# Patient Record
Sex: Male | Born: 1935 | Race: White | Hispanic: No | Smoking: Never smoker
Health system: Southern US, Community
[De-identification: ages and names within clinical notes are randomized; demographics above are authoritative.]

## PROBLEM LIST (undated history)

## (undated) DIAGNOSIS — J189 Pneumonia, unspecified organism: Secondary | ICD-10-CM

## (undated) DIAGNOSIS — I639 Cerebral infarction, unspecified: Secondary | ICD-10-CM

## (undated) DIAGNOSIS — I10 Essential (primary) hypertension: Secondary | ICD-10-CM

## (undated) DIAGNOSIS — J209 Acute bronchitis, unspecified: Secondary | ICD-10-CM

## (undated) DIAGNOSIS — K219 Gastro-esophageal reflux disease without esophagitis: Secondary | ICD-10-CM

## (undated) HISTORY — DX: Essential (primary) hypertension: I10

## (undated) HISTORY — DX: Acute bronchitis, unspecified: J20.9

## (undated) HISTORY — PX: JOINT REPLACEMENT: SHX530

## (undated) HISTORY — PX: BACK SURGERY: SHX140

## (undated) HISTORY — PX: LUMBAR DISC SURGERY: SHX700

## (undated) HISTORY — DX: Cerebral infarction, unspecified: I63.9

## (undated) HISTORY — PX: HERNIA REPAIR: SHX51

---

## 1998-02-22 ENCOUNTER — Encounter: Admission: RE | Admit: 1998-02-22 | Discharge: 1998-04-29 | Payer: Self-pay | Admitting: Internal Medicine

## 1998-02-22 ENCOUNTER — Encounter: Payer: Self-pay | Admitting: Orthopedic Surgery

## 1998-06-08 ENCOUNTER — Encounter: Payer: Self-pay | Admitting: Pulmonary Disease

## 1998-06-08 ENCOUNTER — Inpatient Hospital Stay (HOSPITAL_COMMUNITY): Admission: EM | Admit: 1998-06-08 | Discharge: 1998-06-10 | Payer: Self-pay | Admitting: Pulmonary Disease

## 1999-05-09 ENCOUNTER — Ambulatory Visit (HOSPITAL_COMMUNITY): Admission: RE | Admit: 1999-05-09 | Discharge: 1999-05-09 | Payer: Self-pay | Admitting: Pulmonary Disease

## 1999-05-09 ENCOUNTER — Encounter: Payer: Self-pay | Admitting: Pulmonary Disease

## 1999-05-18 ENCOUNTER — Inpatient Hospital Stay (HOSPITAL_COMMUNITY): Admission: RE | Admit: 1999-05-18 | Discharge: 1999-05-20 | Payer: Self-pay | Admitting: Neurosurgery

## 1999-10-02 ENCOUNTER — Encounter: Payer: Self-pay | Admitting: Internal Medicine

## 1999-10-02 ENCOUNTER — Ambulatory Visit (HOSPITAL_COMMUNITY): Admission: RE | Admit: 1999-10-02 | Discharge: 1999-10-02 | Payer: Self-pay | Admitting: Internal Medicine

## 2002-01-06 ENCOUNTER — Ambulatory Visit (HOSPITAL_COMMUNITY): Admission: RE | Admit: 2002-01-06 | Discharge: 2002-01-06 | Payer: Self-pay | Admitting: Neurosurgery

## 2002-01-29 ENCOUNTER — Emergency Department (HOSPITAL_COMMUNITY): Admission: RE | Admit: 2002-01-29 | Discharge: 2002-01-29 | Payer: Self-pay | Admitting: Pulmonary Disease

## 2002-01-29 ENCOUNTER — Encounter: Payer: Self-pay | Admitting: Pulmonary Disease

## 2003-09-27 ENCOUNTER — Ambulatory Visit (HOSPITAL_COMMUNITY): Admission: RE | Admit: 2003-09-27 | Discharge: 2003-09-27 | Payer: Self-pay | Admitting: Neurosurgery

## 2004-02-24 ENCOUNTER — Ambulatory Visit: Payer: Self-pay | Admitting: Pulmonary Disease

## 2004-04-05 ENCOUNTER — Ambulatory Visit: Payer: Self-pay | Admitting: Pulmonary Disease

## 2005-01-31 ENCOUNTER — Ambulatory Visit: Payer: Self-pay | Admitting: Pulmonary Disease

## 2005-04-10 ENCOUNTER — Ambulatory Visit: Payer: Self-pay | Admitting: Pulmonary Disease

## 2010-08-25 NOTE — Op Note (Signed)
[place]  Patient:    Russell [name], Russell [name]                      MRN: [id number] Proc. Date: 05/18/99 Adm. Date:  [id number] Attending:  Tressie Stalker D                           Operative Report  PREOPERATIVE DIAGNOSIS:  L4-5 degenerative disk disease, spondylosis, and far lateral herniated nucleus pulposus.  POSTOPERATIVE DIAGNOSIS:  L4-5 degenerative disk disease, spondylosis, and far lateral herniated nucleus pulposus.  PROCEDURE:  Far lateral (i.e. extraforaminal) microdiskectomy using microdissection at L4-5 on the left.  SURGEON:  Cristi Loron, M.D.  ASSISTANT:  Hewitt Shorts, M.D.  ANESTHESIA:  General endotracheal.  ESTIMATED BLOOD LOSS:  Less than 100 cc.  SPECIMENS:  None.  DRAINS:  None.  COMPLICATIONS:  None.  BRIEF HISTORY:  The patient is an 77 year old white male who began having severe left leg pain approximately one month ago without precipitating event.  He has failed medical management, and was therefore worked up with a lumbar MRI that demonstrated an extraforaminal disk herniation at L4-5 on the left.  His signs nd symptoms on physical examination were consistent with a left L4 radiculopathy. He had failed medical management and therefore weighed the risks, benefits and alternatives of surgery and decided to proceed with a far lateral microdiskectomy.  DESCRIPTION OF PROCEDURE:  The patient was brought to the operating room by the  anesthesia team.  General endotracheal anesthesia was induced.  The patient was  turned to the prone position on the Wilson frame, and his lumbosacral region was then shaved and prepared with Betadine, and scrubbed with Betadine solution. Sterile drapes were applied.  I then injected the area to be incised with Marcaine with epinephrine solution.  I made a midline incision over the L4-5 interspace. I used electrocautery to dissect down to the thoracolumbar fascia.  I  divided the  fascia just to the left of the midline and performed a left-sided subperiosteal  dissection, stripping the paraspinous musculature from the left spinous process and lamina of L4 and L5.  I obtained an intraoperative radiograph at the midline at  L4-5.  I used electrocautery to _______ the fascia from the lateral aspect of the facet joint and I used the McCullough retractor for exposure.  I then brought the operative microscope in the field and under instant magnification and illumination, I completed the microdissection/decompression. I used the Midas Rex high speed drill to drill off the lateral edge of the left L4-5 facet joint.  I drilled until I encountered the intertransverse ligament.  I then divided the ligament with the Kerrison punch and 15 blade scalpel.  I then carefully dissected through the soft tissue and identified the exiting L4 nerve  root and the L4 pedicle.  Beneath it was a huge free fragment disk herniation which was severely compressing the nerve root from the ventral direction.  I incised nto the disk fragment, removed it in several large fragments.  There was a large hole in the annulus, and I therefore, incised the annulus at the disk space and widened the hole somewhat, and performed a partial diskectomy in the interspace with the pituitary forceps and Epstein curets.  I then palpated about the ventral surface of the L4 nerve root and noted it was well-decompressed as it exited around the L4  pedicle and out  the soft tissues.  I used electrocautery to achieve strenuous hemostasis.  I then copiously irrigated the wound with Bacitracin solution, removed the solution, and then deposited Depo-Medrol and fentanyl solution in the epidural space.  I removed the McCullough retractor, and then reapproximated the patients thoracolumbar fascia with interrupted #1 Vicryl, the subcutaneous tissue with interrupted #1 Vicryl, the skin with  Steri-Strips and Benzoin.  The wound was then coated with Bacitracin ointment and sterile dressing was applied.  The drapes were removed and the patient was subsequently returned to he supine position, where he was extubated by the anesthesia team, and transported to the postanesthesia care unit in stable condition.  All sponge, instrument, and needle counts were correct at the end of this case. DD:  05/18/99 TD:  05/19/99 Job: [id number]

## 2010-08-25 NOTE — Discharge Summary (Signed)
[place]  Patient:    Russell [name], Russell [name]                      MRN: [id number] Adm. Date:  [id number] Disc. Date: [id number] Attending:  Tressie Stalker D                           Discharge Summary  For full details of this admission, please refer to typed history and physical.   BRIEF HISTORY:  The patient is an 77 year old white male, who began having severe left leg pain approximately one month ago without precipitating event.  He has failed medical management and was therefore worked up as an outpatient with a lumbar MRI which demonstrated a extraforaminal disk herniation, L4-5 on the left. His signs and symptoms on physical examination were consistent with a left L4 radiculopathy.  The patient therefore weighed the risks, benefits and alternatives of surgery and decided to proceed with a far lateral microdiskectomy.  For past medical history, past surgical history, medication prior to admission,  drug allergies, family medical history, social history, admission physical examination, imaging studies, assessment and plan, please refer to the transcribed. history and physical.  HOSPITAL COURSE:  I admitted the patient to [facility] on May 18, 1999, with a diagnosis of L4-5 degenerative disease, far lateral herniated nucleus pulposus, spondylosis.  On the day of admission I performed a far lateral, i.e., extraforaminal and microdiskectomy using microdissection at L4-5 on the left. he surgery went well without complications (for full details of this operation, please refer to transcribed operative note).  POSTOPERATIVE COURSE:  The patients postoperative course is essentially unremarkable.  By postop day #2 he is eating and ambulating well.  His wound was healing well without signs of infection.  His leg pain was gone and he is requesting discharge to home.  I therefore discharged him to home on  May 20, 1999.  DISCHARGE INSTRUCTIONS:  The patient was given written discharge instructions and instructed to follow up with me in three weeks.  DISCHARGE MEDICATIONS: 1. Vicodin, #60, one to two p.o. q.4h. p.r.n. for pain, limit 8 per day, one refill. 2. Valium 5 mg, #30, one p.o. q.6h. p.r.n. for muscle spasms.  No refills.  FINAL DIAGNOSES:  Far lateral herniated nucleus pulposus, L4-5 on the left, L4-5 degenerative disease, spondylosis.  PROCEDURE PERFORMED:  Far lateral microdiskectomy using microdissection, L4-5 on the left. DD:  05/20/99 TD:  05/22/99 Job: [id number]

## 2010-08-25 NOTE — H&P (Signed)
[place]  Patient:    Russell [name], Russell [name]                      MRN: [id number] Adm. Date:  [id number] Attending:  Tressie Stalker D                         History and Physical  CHIEF COMPLAINT:  Left leg pain.  HISTORY OF PRESENT ILLNESS:  The patient is an 77 year old white male who began  having pain in his left leg several weeks ago.  It was severe.  He does not recall any precipitating event.  He just "woke up with it."  He complained to his primary doctor, Dr. Lonzo Cloud. Kriste Basque, who treated him with medications.  Unfortunately, did not improve and he was sent for lumbar MRI which demonstrated disk herniation, nd was kindly sent for my consultation.  The patient complains of severe pain in his left leg which limits his activities of daily living, and makes it difficult to even walk.  He had been using a cane or  wheelchair at times.  He describes pain which began in his left hip, radiates down the left leg just past the knee.  It is not associated with any numbness or tingling.  He says his leg has been weak in general and his knee gives out.  He has no trouble on the right.  He has not improved despite medical management.  He has little back pain.  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  PAST SURGICAL HISTORY:  Right inguinal herniorrhaphy.  MEDICATIONS PRIOR TO ADMISSION: 1. Norvasc 5 mg p.o. q.d. 2. Monopril 10 mg p.o. q.d. 3. Aspirin 1 p.o. q.d. 4. Vioxx 25 mg p.o. q.d. 5. Vicodin 1 p.o. q.4h. p.r.n. for pain. 6. Medrol Dosepak. 7. Levaquin daily.  ALLERGIES:  PENICILLIN causes syncope and rash.  FAMILY MEDICAL HISTORY:  The patients mother died at age 16 secondary to a cerebral aneurysm.  Father died in his 30s secondary to double  pneumonia.  SOCIAL HISTORY:  The patient is married, he has four children, he is retired from [employer] and lives in [place].  He denies tobacco and drug se. He drinks one alcohol drink every other day.  REVIEW OF SYSTEMS:  Negative except as above.  PHYSICAL EXAMINATION:  GENERAL:  A pleasant, healthy-appearing 77 year old white male in obvious distress, walking with an antalgic gait, pushing along a wheelchair.  HEIGHT AND WEIGHT:  Height 5 feet 10 inches, weight 186 pounds.  HEENT:  Normocephalic, atraumatic.  Pupils are equal, round and reactive to light. Extraocular muscles intact.  Sclerae white, conjunctivae pink.  Oropharynx benign, uvula midline.  NECK:  Supple.  There are no masses, meningismus, deformities, tracheal deviation, jugular venous distension, or carotid bruits.  There is normal cervical range of motion.  Spurling test is negative.  Lhermittes sign was not present.  THORAX:  Symmetric.  Lungs are clear to auscultation without rales, rhonchi, or  wheezes.  HEART:  Regular rate and rhythm.  ABDOMEN:  Soft, nontender, no guarding or rebound.  EXTREMITIES:  Has a well-healed right lower extremity shrapnel injury.  He is tender to palpation on movement of his left leg, but has no obvious deformities.  BACK:  Demonstrates no point  tenderness or deformity.  Straight leg raise testing is positive on the left, negative on the right.  Faberes test is negative bilaterally.  NEUROLOGIC:  The patient is alert and oriented x 3.  Cranial nerves II-XII are grossly intact bilaterally.  Vision and hearing are grossly normal bilaterally.  Motor strength is 5/5 in the bilateral deltoids, biceps, triceps, hand grips, wrist extensors, interosseus, psoas, gastrocnemius, extensor hallucis longus, and right quadriceps.  He has some diminished strength in his left quadriceps, i.e. 4/5.  Cerebellar exam is intact to rapid alternating movements of  the upper extremities bilaterally.  Sensory exam demonstrates decreased pinprick and light touch sensation in his anterolateral left lower extremity, consistent with the L4-L5 distribution.  Deep tendon reflexes are 2/4 in the bilateral biceps, triceps, brachioradialis, right quadriceps, and gastrocnemius; absent in his left quadriceps; 1/4 left gastrocnemius.  DIAGNOSTIC STUDIES:  I have reviewed the patients lumbar plain x-ray performed May 09, 1999.  He has multi-level degenerative disease and spondylosis.  He had a lumbar MRI performed at [facility] on May 09, 1999, which demonstrates he has normal lumbar lordosis, no ______ or subluxation.  He has degenerative disk disease at L4-5 with a bulging disk.  _____ demonstrate he has a large herniated disk far lateral on the left.  ASSESSMENT AND PLAN:  L4-5 far lateral herniated disk with L4 radiculopathy, degenerative disease, spondylosis.  I have discussed the procedure with the patient, reviewed his MRI scan with him, pointed out the abnormalities.  While he MRI scan is of low resolution, the abnormalities seen do seem consistent with his signs, symptoms, and physical exam, i.e., a left L4 radiculopathy.  I have discussed the various treatment options with him, including continued medical management, steroid injections, and surgery.  I described the procedure of a far lateral microdiskectomy ______ .  I have discussed the risks of surgery extensively.  The patient has weighed the risks, benefits, and alternatives of surgery and would like to proceed with the operation on May 18, 1999.  History of hypertension, prior cerebrovascular accident, etc. noted.DD: 05/18/99 TD:  05/18/99 Job: [id number]

## 2011-03-15 ENCOUNTER — Encounter: Payer: Self-pay | Admitting: Internal Medicine

## 2011-03-20 ENCOUNTER — Ambulatory Visit: Payer: Self-pay | Admitting: Internal Medicine

## 2011-03-20 ENCOUNTER — Encounter: Payer: Self-pay | Admitting: Internal Medicine

## 2012-02-05 ENCOUNTER — Inpatient Hospital Stay (HOSPITAL_COMMUNITY)
Admission: EM | Admit: 2012-02-05 | Discharge: 2012-02-11 | DRG: 640 | Disposition: A | Discharge status: Nursing Home (Includes ICF) | Payer: Medicare Other | Attending: Internal Medicine | Admitting: Internal Medicine

## 2012-02-05 ENCOUNTER — Other Ambulatory Visit: Payer: Self-pay

## 2012-02-05 DIAGNOSIS — I129 Hypertensive chronic kidney disease with stage 1 through stage 4 chronic kidney disease, or unspecified chronic kidney disease: Secondary | ICD-10-CM | POA: Diagnosis present

## 2012-02-05 DIAGNOSIS — R918 Other nonspecific abnormal finding of lung field: Secondary | ICD-10-CM | POA: Diagnosis present

## 2012-02-05 DIAGNOSIS — J209 Acute bronchitis, unspecified: Secondary | ICD-10-CM | POA: Diagnosis present

## 2012-02-05 DIAGNOSIS — Z79899 Other long term (current) drug therapy: Secondary | ICD-10-CM

## 2012-02-05 DIAGNOSIS — N189 Chronic kidney disease, unspecified: Secondary | ICD-10-CM | POA: Diagnosis present

## 2012-02-05 DIAGNOSIS — R531 Weakness: Secondary | ICD-10-CM

## 2012-02-05 DIAGNOSIS — R222 Localized swelling, mass and lump, trunk: Secondary | ICD-10-CM | POA: Diagnosis present

## 2012-02-05 DIAGNOSIS — R1012 Left upper quadrant pain: Secondary | ICD-10-CM | POA: Diagnosis present

## 2012-02-05 DIAGNOSIS — R110 Nausea: Secondary | ICD-10-CM | POA: Diagnosis present

## 2012-02-05 DIAGNOSIS — D649 Anemia, unspecified: Secondary | ICD-10-CM | POA: Diagnosis present

## 2012-02-05 DIAGNOSIS — R410 Disorientation, unspecified: Secondary | ICD-10-CM

## 2012-02-05 DIAGNOSIS — Z880 Allergy status to penicillin: Secondary | ICD-10-CM

## 2012-02-05 DIAGNOSIS — N19 Unspecified kidney failure: Secondary | ICD-10-CM

## 2012-02-05 DIAGNOSIS — E871 Hypo-osmolality and hyponatremia: Principal | ICD-10-CM | POA: Diagnosis present

## 2012-02-05 DIAGNOSIS — J189 Pneumonia, unspecified organism: Secondary | ICD-10-CM | POA: Diagnosis present

## 2012-02-05 DIAGNOSIS — K8020 Calculus of gallbladder without cholecystitis without obstruction: Secondary | ICD-10-CM | POA: Diagnosis present

## 2012-02-05 DIAGNOSIS — Z8673 Personal history of transient ischemic attack (TIA), and cerebral infarction without residual deficits: Secondary | ICD-10-CM

## 2012-02-05 LAB — CBC
HCT: 25.7 % — ABNORMAL LOW (ref 39.0–52.0)
Hemoglobin: 9 g/dL — ABNORMAL LOW (ref 13.0–17.0)
MCH: 30.3 pg (ref 26.0–34.0)
MCHC: 35 g/dL (ref 30.0–36.0)
MCV: 86.5 fL (ref 78.0–100.0)
Platelets: 317 10*3/uL (ref 150–400)
RBC: 2.97 MIL/uL — ABNORMAL LOW (ref 4.22–5.81)
RDW: 14.2 % (ref 11.5–15.5)
WBC: 8.4 10*3/uL (ref 4.0–10.5)

## 2012-02-05 LAB — BASIC METABOLIC PANEL
BUN: 27 mg/dL — ABNORMAL HIGH (ref 6–23)
CO2: 24 mEq/L (ref 19–32)
Calcium: 8.5 mg/dL (ref 8.4–10.5)
Chloride: 90 mEq/L — ABNORMAL LOW (ref 96–112)
Creatinine, Ser: 2.23 mg/dL — ABNORMAL HIGH (ref 0.50–1.35)
GFR calc Af Amer: 28 mL/min — ABNORMAL LOW (ref >90)
GFR calc non Af Amer: 24 mL/min — ABNORMAL LOW (ref >90)
Glucose, Bld: 106 mg/dL — ABNORMAL HIGH (ref 70–99)
Potassium: 3.8 mEq/L (ref 3.5–5.1)
Sodium: 124 mEq/L — ABNORMAL LOW (ref 135–145)

## 2012-02-05 LAB — URINALYSIS, ROUTINE W REFLEX MICROSCOPIC
Bilirubin Urine: NEGATIVE
Glucose, UA: NEGATIVE mg/dL
Hgb urine dipstick: NEGATIVE
Ketones, ur: NEGATIVE mg/dL
Leukocytes, UA: NEGATIVE
Nitrite: NEGATIVE
Protein, ur: NEGATIVE mg/dL
Specific Gravity, Urine: 1.01 (ref 1.005–1.030)
Urobilinogen, UA: 0.2 mg/dL (ref 0.0–1.0)
pH: 6 (ref 5.0–8.0)

## 2012-02-05 MED ORDER — IOHEXOL 300 MG/ML  SOLN
20.0000 mL | INTRAMUSCULAR | Status: AC
  Administered 2012-02-05 (×2): 20 mL via ORAL

## 2012-02-05 MED ORDER — SODIUM CHLORIDE 0.9 % IV BOLUS (SEPSIS)
500.0000 mL | Freq: Once | INTRAVENOUS | Status: AC
  Administered 2012-02-05: 500 mL via INTRAVENOUS

## 2012-02-05 NOTE — ED Notes (Signed)
Patient is resting comfortably. 

## 2012-02-05 NOTE — ED Notes (Addendum)
EMS called out to [facility] by MD.  Patient is known to have electrolyte abnormalities, fever and increased confusion and tenderness in LLQ also diarrhea and nausea.  He also complained headache and dizziness.  According to EMS his main complaint is weakness and urinary frequency.  EMS also advised he is saying things that do not make sense.  In and out of confusion.  Patient's vitals are BP=102/56, HR=80, CBG=121, Resp 16.  Report received from [facility], [place].

## 2012-02-05 NOTE — ED Notes (Signed)
Patient was given decaf coffee. 

## 2012-02-05 NOTE — ED Provider Notes (Signed)
History    78 year old male with reported fever and some confusion. Patient has seemed increasingly fatigued over the past 2 days. Patient is complaining of increased urinary frequency, left lower quadrant pain and generalized weakness. He denies feeling febrile or having chills. Mild nausea but no vomiting. His abdominal pain is in the left lower quadrant. There is relatively constant. Mild ache and does not radiate. No appreciable exacerbating relieving factors. Having diarrhea. No progressive blood per rectum or melena. No recent antibiotic use. No recent falls reported.  CSN: [id number]  Arrival date & time 02/05/12  1629   First MD Initiated Contact with Patient 02/05/12 1638      Chief Complaint  Patient presents with  . Fever  . Urinary Tract Infection    (Consider location/radiation/quality/duration/timing/severity/associated sxs/prior treatment) HPI  Past Medical History  Diagnosis Date  . Stroke   . Acute bronchitis   . Hypertension     No past surgical history on file.  No family history on file.  History  Substance Use Topics  . Smoking status: Not on file  . Smokeless tobacco: Not on file  . Alcohol Use: Not on file      Review of Systems   Review of symptoms negative unless otherwise noted in HPI.   Allergies  Penicillins  Home Medications   Current Outpatient Rx  Name Route Sig Dispense Refill  . ACETAMINOPHEN 100 MG/ML PO SOLN Oral Take 10 mg/kg by mouth as needed.      Marland Kitchen DIAZEPAM 5 MG PO TABS Oral Take 5 mg by mouth every 6 (six) hours as needed.      Marland Kitchen HYDROCODONE-ACETAMINOPHEN 5-500 MG PO TABS Oral Take 1 tablet by mouth as needed.      Marland Kitchen HYDROXYZINE HCL 10 MG PO TABS Oral Take 10 mg by mouth as needed.      Marland Kitchen MAGNESIUM HYDROXIDE 400 MG/5ML PO SUSP Oral Take by mouth as needed.      Marland Kitchen NAPROXEN PO Oral Take 50 mg by mouth every 8 (eight) hours.      . OMEPRAZOLE 20 MG PO CPDR Oral Take 20 mg by mouth daily.      Marland Kitchen PROPOXYPHENE  N-ACETAMINOPHEN 100-650 MG PO TABS Oral Take 1 tablet by mouth as needed.        BP 111/60  Pulse 58  Temp 98.9 F (37.2 C) (Rectal)  Resp 16  SpO2 97%  Physical Exam  Nursing note and vitals reviewed. Constitutional: He appears well-developed and well-nourished. No distress.  HENT:  Head: Normocephalic and atraumatic.  Eyes: Conjunctivae normal are normal. Pupils are equal, round, and reactive to light. Right eye exhibits no discharge. Left eye exhibits no discharge.  Neck: Neck supple.  Cardiovascular: Normal rate, regular rhythm and normal heart sounds.  Exam reveals no gallop and no friction rub.   No murmur heard. Pulmonary/Chest: Effort normal and breath sounds normal. No respiratory distress.  Abdominal: Soft. He exhibits no distension. There is tenderness.       Left lower quadrant tenderness without guarding or rebound. No distention. No palpable mass.  Genitourinary:       No costovertebral angle tenderness  Musculoskeletal: He exhibits no edema and no tenderness.  Neurological: He is alert. No cranial nerve deficit. He exhibits normal muscle tone. Coordination normal.       Patient is disoriented to time  Skin: Skin is warm and dry.  Psychiatric: He has a normal mood and affect. His behavior is normal. Thought content normal.  ED Course  Procedures (including critical care time)  Labs Reviewed  CBC - Abnormal; Notable for the following:    RBC 2.97 (*)     Hemoglobin 9.0 (*)     HCT 25.7 (*)     All other components within normal limits  BASIC METABOLIC PANEL - Abnormal; Notable for the following:    Sodium 124 (*)     Chloride 90 (*)     Glucose, Bld 106 (*)     BUN 27 (*)     Creatinine, Ser 2.23 (*)     GFR calc non Af Amer 24 (*)     GFR calc Af Amer 28 (*)     All other components within normal limits  URINALYSIS, ROUTINE W REFLEX MICROSCOPIC   No results found.   1. Hyponatremia   2. Renal failure, unspecified   3. Acute confusion        MDM  89yM with reported confusion. Noted to be hyponatremic and elevated Cr. Baseline not clear. No priors for comparison. Per patient, no hx of kidney issues that he is aware although his history is not completely reliable. None noted on PMHx. Seems like most of prior care has been through [place]. C/o of urinary frequency. UA normal. Mild LLQ tenderness on exam so will CT, particularly given age. No fever, leukocytosis, diarrhea, etc to suggest diverticulitis. Given hyponatremia of unclear chronicity and reported confusion will admit.        Raeford Razor, MD 02/06/12 [phone]

## 2012-02-06 ENCOUNTER — Observation Stay (HOSPITAL_COMMUNITY): Payer: Medicare Other

## 2012-02-06 ENCOUNTER — Encounter (HOSPITAL_COMMUNITY): Payer: Self-pay | Admitting: Radiology

## 2012-02-06 DIAGNOSIS — R5383 Other fatigue: Secondary | ICD-10-CM

## 2012-02-06 DIAGNOSIS — E871 Hypo-osmolality and hyponatremia: Secondary | ICD-10-CM | POA: Diagnosis present

## 2012-02-06 DIAGNOSIS — N189 Chronic kidney disease, unspecified: Secondary | ICD-10-CM | POA: Diagnosis present

## 2012-02-06 DIAGNOSIS — R531 Weakness: Secondary | ICD-10-CM | POA: Diagnosis present

## 2012-02-06 DIAGNOSIS — D649 Anemia, unspecified: Secondary | ICD-10-CM | POA: Diagnosis present

## 2012-02-06 DIAGNOSIS — N19 Unspecified kidney failure: Secondary | ICD-10-CM

## 2012-02-06 LAB — CBC WITH DIFFERENTIAL/PLATELET
Lymphocytes Relative: 18 % (ref 12–46)
Lymphs Abs: 1.5 10*3/uL (ref 0.7–4.0)
Neutro Abs: 5.6 10*3/uL (ref 1.7–7.7)
Neutrophils Relative %: 66 % (ref 43–77)
Platelets: 299 10*3/uL (ref 150–400)
RBC: 2.78 MIL/uL — ABNORMAL LOW (ref 4.22–5.81)
WBC: 8.4 10*3/uL (ref 4.0–10.5)

## 2012-02-06 LAB — SODIUM, URINE, RANDOM: Sodium, Ur: 51 mEq/L

## 2012-02-06 LAB — BASIC METABOLIC PANEL
BUN: 23 mg/dL (ref 6–23)
CO2: 20 mEq/L (ref 19–32)
Calcium: 8 mg/dL — ABNORMAL LOW (ref 8.4–10.5)
Chloride: 97 mEq/L (ref 96–112)
Creatinine, Ser: 1.8 mg/dL — ABNORMAL HIGH (ref 0.50–1.35)
GFR calc Af Amer: 36 mL/min — ABNORMAL LOW (ref >90)
GFR calc non Af Amer: 28 mL/min — ABNORMAL LOW (ref >90)
GFR calc non Af Amer: 30 mL/min — ABNORMAL LOW (ref >90)
Glucose, Bld: 87 mg/dL (ref 70–99)
Glucose, Bld: 81 mg/dL (ref 70–99)
Potassium: 4.2 mEq/L (ref 3.5–5.1)
Potassium: 4.5 mEq/L (ref 3.5–5.1)
Sodium: 123 mEq/L — ABNORMAL LOW (ref 135–145)
Sodium: 129 mEq/L — ABNORMAL LOW (ref 135–145)

## 2012-02-06 LAB — HEPATIC FUNCTION PANEL
Albumin: 2.6 g/dL — ABNORMAL LOW (ref 3.5–5.2)
Bilirubin, Direct: <0.1 mg/dL (ref 0.0–0.3)
Total Bilirubin: 0.2 mg/dL — ABNORMAL LOW (ref 0.3–1.2)

## 2012-02-06 LAB — OSMOLALITY, URINE: Osmolality, Ur: 268 mOsm/kg — ABNORMAL LOW (ref 390–1090)

## 2012-02-06 MED ORDER — ONDANSETRON HCL 4 MG PO TABS: 4.0000 mg | ORAL_TABLET | Freq: Four times a day (QID) | ORAL | Status: DC | PRN

## 2012-02-06 MED ORDER — ACETAMINOPHEN 325 MG PO TABS
650.0000 mg | ORAL_TABLET | ORAL | Status: DC | PRN
  Administered 2012-02-06: 650 mg via ORAL
  Filled 2012-02-06: qty 2

## 2012-02-06 MED ORDER — ACETAMINOPHEN 650 MG RE SUPP: 650.0000 mg | Freq: Four times a day (QID) | RECTAL | Status: DC | PRN

## 2012-02-06 MED ORDER — ONDANSETRON HCL 4 MG/2ML IJ SOLN: 4.0000 mg | Freq: Three times a day (TID) | INTRAMUSCULAR | Status: DC | PRN

## 2012-02-06 MED ORDER — SODIUM CHLORIDE 0.9 % IV SOLN
INTRAVENOUS | Status: AC
  Administered 2012-02-06: 16:00:00 via INTRAVENOUS

## 2012-02-06 MED ORDER — HYDROCODONE-ACETAMINOPHEN 5-325 MG PO TABS
1.0000 | ORAL_TABLET | Freq: Two times a day (BID) | ORAL | Status: DC | PRN
  Administered 2012-02-09 – 2012-02-11 (×3): 1 via ORAL
  Filled 2012-02-06 (×3): qty 1

## 2012-02-06 MED ORDER — PANTOPRAZOLE SODIUM 40 MG PO TBEC
40.0000 mg | DELAYED_RELEASE_TABLET | Freq: Every day | ORAL | Status: DC
  Administered 2012-02-06 – 2012-02-11 (×6): 40 mg via ORAL
  Filled 2012-02-06 (×6): qty 1

## 2012-02-06 MED ORDER — ACETAMINOPHEN 325 MG PO TABS
650.0000 mg | ORAL_TABLET | Freq: Four times a day (QID) | ORAL | Status: DC | PRN
  Administered 2012-02-07 – 2012-02-11 (×7): 650 mg via ORAL
  Filled 2012-02-06 (×7): qty 2

## 2012-02-06 MED ORDER — DIAZEPAM 5 MG PO TABS
5.0000 mg | ORAL_TABLET | Freq: Four times a day (QID) | ORAL | Status: DC | PRN
Start: 2012-02-06 — End: 2012-02-07

## 2012-02-06 MED ORDER — ONDANSETRON HCL 4 MG/2ML IJ SOLN: 4.0000 mg | Freq: Four times a day (QID) | INTRAMUSCULAR | Status: DC | PRN

## 2012-02-06 MED ORDER — HYDROCODONE-ACETAMINOPHEN 5-500 MG PO TABS: 1.0000 | ORAL_TABLET | Freq: Two times a day (BID) | ORAL | Status: DC | PRN

## 2012-02-06 MED ORDER — SODIUM CHLORIDE 0.9 % IV SOLN
INTRAVENOUS | Status: DC
  Administered 2012-02-06: 02:00:00 via INTRAVENOUS

## 2012-02-06 NOTE — Progress Notes (Signed)
Utilization review completed.  

## 2012-02-06 NOTE — H&P (Signed)
Russell [name] is an 78 y.o. male.   Patient was seen and examined on February 06, 2012. PCP - physician at the assisted-living facility. Chief Complaint: Weakness and confusion. HPI: 78 year-old male with previous history of hypertension and CVA was brought to the ER from his assisted living facility because of weakness and confusion. Patient also had complained of some headache and nausea. Patient has been feeling weak for last 2 days. In the ER patient was initially found to be mildly confused. He also was complaining of some left upper quadrant pain and also stated that he has some nausea. His labs show hyponatremia with increased creatinine and we don't have a baseline creatinine of the patient. Patient also had a CT abdomen pelvis which only shows gallstones otherwise unremarkable. Patient at this time has been admitted for further observation.  Past Medical History  Diagnosis Date  . Stroke   . Acute bronchitis   . Hypertension     Past Surgical History  Procedure Date  . Hernia repair   . Lumbar disc surgery     Family History  Problem Relation Age of Onset  . CVA Mother    Social History:  reports that he has never smoked. He does not have any smokeless tobacco history on file. He reports that he does not drink alcohol or use illicit drugs.  Allergies:  Allergies  Allergen Reactions  . Penicillins     Medications Prior to Admission  Medication Sig Dispense Refill  . acetaminophen (TYLENOL) 100 MG/ML solution Take 10 mg/kg by mouth as needed.        . diazepam (VALIUM) 5 MG tablet Take 5 mg by mouth every 6 (six) hours as needed.        Marland Kitchen HYDROcodone-acetaminophen (VICODIN) 5-500 MG per tablet Take 1 tablet by mouth as needed.        . hydrOXYzine (ATARAX/VISTARIL) 10 MG tablet Take 10 mg by mouth as needed.        . magnesium hydroxide (MILK OF MAGNESIA) 400 MG/5ML suspension Take by mouth as needed.        Marland Kitchen NAPROXEN PO Take 50 mg by mouth every 8 (eight) hours.          Marland Kitchen omeprazole (PRILOSEC) 20 MG capsule Take 20 mg by mouth daily.        . propoxyphene-acetaminophen (DARVOCET-N 100) 100-650 MG per tablet Take 1 tablet by mouth as needed.          Results for orders placed during the hospital encounter of 02/05/12 (from the past 48 hour(s))  CBC     Status: Abnormal   Collection Time   02/05/12  4:57 PM      Component Value Range Comment   WBC 8.4  4.0 - 10.5 K/uL    RBC 2.97 (*) 4.22 - 5.81 MIL/uL    Hemoglobin 9.0 (*) 13.0 - 17.0 g/dL    HCT 45.4 (*) 09.8 - 52.0 %    MCV 86.5  78.0 - 100.0 fL    MCH 30.3  26.0 - 34.0 pg    MCHC 35.0  30.0 - 36.0 g/dL    RDW 11.9  14.7 - 82.9 %    Platelets 317  150 - 400 K/uL   BASIC METABOLIC PANEL     Status: Abnormal   Collection Time   02/05/12  4:57 PM      Component Value Range Comment   Sodium 124 (*) 135 - 145 mEq/L  Potassium 3.8  3.5 - 5.1 mEq/L    Chloride 90 (*) 96 - 112 mEq/L    CO2 24  19 - 32 mEq/L    Glucose, Bld 106 (*) 70 - 99 mg/dL    BUN 27 (*) 6 - 23 mg/dL    Creatinine, Ser 5.28 (*) 0.50 - 1.35 mg/dL    Calcium 8.5  8.4 - 41.3 mg/dL    GFR calc non Af Amer 24 (*) >90 mL/min    GFR calc Af Amer 28 (*) >90 mL/min   URINALYSIS, ROUTINE W REFLEX MICROSCOPIC     Status: Normal   Collection Time   02/05/12  9:36 PM      Component Value Range Comment   Color, Urine YELLOW  YELLOW    APPearance CLEAR  CLEAR    Specific Gravity, Urine 1.010  1.005 - 1.030    pH 6.0  5.0 - 8.0    Glucose, UA NEGATIVE  NEGATIVE mg/dL    Hgb urine dipstick NEGATIVE  NEGATIVE    Bilirubin Urine NEGATIVE  NEGATIVE    Ketones, ur NEGATIVE  NEGATIVE mg/dL    Protein, ur NEGATIVE  NEGATIVE mg/dL    Urobilinogen, UA 0.2  0.0 - 1.0 mg/dL    Nitrite NEGATIVE  NEGATIVE    Leukocytes, UA NEGATIVE  NEGATIVE MICROSCOPIC NOT DONE ON URINES WITH NEGATIVE PROTEIN, BLOOD, LEUKOCYTES, NITRITE, OR GLUCOSE <1000 mg/dL.   Ct Abdomen Pelvis Wo Contrast  02/06/2012  *RADIOLOGY REPORT*  Clinical Data: Urinary tract  infection.  Fever increased confusion. Left lower quadrant pain.  Diarrhea and nausea.  CT ABDOMEN AND PELVIS WITHOUT CONTRAST  Technique:  Multidetector CT imaging of the abdomen and pelvis was performed following the standard protocol without intravenous contrast.  Comparison: None.  Findings: Mild dependent atelectasis is present in the right lung base.  There is some scarring laterally in the left lower lobe.  The heart size is normal.  Coronary artery calcifications are evident.  A 10 mm hypodense lesion in the left lobe liver is likely benign. The liver is otherwise unremarkable.  The spleen is within normal limits.  The stomach, duodenum, and pancreas are within normal limits.  The common bile duct is normal.  Layering stones are present in the gallbladder without evidence for inflammation.  The adrenal glands are normal bilaterally.  There is mild stranding about both kidneys without focal inflammation or hydronephrosis. Ureters are within normal limits to the urinary bladder bilaterally.  The urinary bladder is unremarkable.  The rectosigmoid colon is within normal limits.  The remainder of the colon is unremarkable.  The appendix is visualized and within normal limits.  The small bowel is unremarkable.  No significant adenopathy or free fluid is present.  There is some fat herniating into the inguinal canals bilaterally without associated bowel.  Bone windows demonstrate moderate endplate degenerative changes at L3-4, L4-5, and L5-S1.  Slight leftward curvature of the lumbar spine is present.  IMPRESSION:  1.  No acute or focal abnormality to explain the patient's symptoms. 2.  Atherosclerosis. 3.  Cholelithiasis without evidence for cholecystitis. 4.  Atelectasis and scarring at the lung bases. 5.  Spondylosis of the lower lumbar spine. 6.  10 mm hypodense lesion in the left lobe of liver likely represents a simple cyst.   Original Report Authenticated By: Jamesetta Orleans. MATTERN, M.D.    Dg Chest Port 1  View  02/06/2012  *RADIOLOGY REPORT*  Clinical Data: Infiltrates.  Shortness of breath.  PORTABLE  CHEST - 1 VIEW  Comparison: None.  Findings: The heart size is exaggerated by low lung volumes. Moderate pulmonary vascular congestion is evident.  There is some tortuosity of the aorta.  Mild dependent atelectasis is present bilaterally.  IMPRESSION:  1.  Low lung volumes and moderate to pulmonary vascular congestion. 2.  Mild bibasilar atelectasis.   Original Report Authenticated By: Jamesetta Orleans. MATTERN, M.D.     Review of Systems  Respiratory: Negative.   Cardiovascular: Negative.   Gastrointestinal: Positive for nausea.  Genitourinary: Negative.   Musculoskeletal: Negative.   Skin: Negative.   Neurological: Positive for weakness and headaches.  Endo/Heme/Allergies: Negative.   Psychiatric/Behavioral: Negative.     Blood pressure 118/67, pulse 56, temperature 98.2 F (36.8 C), temperature source Oral, resp. rate 19, weight 71.033 kg (156 lb 9.6 oz), SpO2 90.00%. Physical Exam  Constitutional: He is oriented to person, place, and time. He appears well-developed and well-nourished. No distress.  HENT:  Head: Normocephalic and atraumatic.  Right Ear: External ear normal.  Left Ear: External ear normal.  Nose: Nose normal.  Mouth/Throat: Oropharynx is clear and moist. No oropharyngeal exudate.  Eyes: Conjunctivae normal are normal. Pupils are equal, round, and reactive to light. Right eye exhibits no discharge. Left eye exhibits no discharge. No scleral icterus.  Neck: Normal range of motion. Neck supple.  Cardiovascular: Normal rate and regular rhythm.   Respiratory: Effort normal and breath sounds normal. No respiratory distress. He has no wheezes. He has no rales.  GI: Soft. Bowel sounds are normal. He exhibits no distension. There is no tenderness. There is no rebound and no guarding.  Musculoskeletal: He exhibits no edema and no tenderness.  Neurological: He is alert and oriented  to person, place, and time.       Moves all extremities.  Skin: Skin is warm and dry. He is not diaphoretic.     Assessment/Plan #1. Generalized weakness with fatigue and hyponatremia - given patient's low chloride level patient's hyponatremia probably would be from dehydration. At this time we will gently hydrate with saline and checked urine sodium and closely follow metabolic panel to see sodium trends. Check TSH. Further recommendations on IV fluids based on urine sodium and sodium trends. #2. Renal failure - not sure if his acute or chronic but given the anemia patient probably could be having chronic renal failure. UA is unremarkable. Closely follow metabolic panel. #3. Nausea with abdominal discomfort - presently patient has no pain. Patient's CAT scan which shows gallstones. Patient did complain of nausea but has not normal pain or tenderness. Check sonogram of the abdomen for gallbladder pathology. #4. Anemia - follow CBC and further workup as outpatient. #5. History of hypertension presently on no medications. #6. History of CVA as per records.  CODE STATUS - full code.  Gisele Pack N. 02/06/2012, 3:49 AM

## 2012-02-06 NOTE — ED Notes (Signed)
Patient transported to CT 

## 2012-02-06 NOTE — Progress Notes (Signed)
Nursing Admission Note  Pt arrived to 6702 via stretcher from the ED. Pt lives at [facility]. A&Ox3, with some confusion noted at times. Pt's only complaint is a slight headache, otherwise no distress noted. VSS. Skin is intact. Oriented to unit and surroundings. Call bell within reach. Bed alarm in place. C.Anvith Mauriello, RN.

## 2012-02-06 NOTE — Progress Notes (Signed)
Patient seen and examined by me.  Spoke with son who said patient had a temperature of 101 at the ALF.  Will get [facility] x2, no fever here. Patient says he is feeling better.  BMP q 12- hold HCTZ.   Marlin Canary DO

## 2012-02-06 NOTE — ED Notes (Signed)
Patient back from CT.

## 2012-02-06 NOTE — Progress Notes (Signed)
[facility] PROGRESS NOTE  Russell [name]:[id number] DOB: 1916/09/05 DOA: 02/05/2012 PCP: No primary provider on file.  Assessment/Plan: #1. Generalized weakness with fatigue and hyponatremia - Very little improvement. Given patient's low chloride level patient's hyponatremia probably would be from dehydration. Repeat bmet this am with little change. Will continue with gently IV hydration with saline.Urine sodium results pending. Will get q12 hour bmet to follow trend. TSH 0.92.  #2. Renal failure - not sure if his acute or chronic but given the anemia patient probably could be having chronic renal failure. UA is unremarkable. Closely follow metabolic panel.  #3. Nausea with abdominal discomfort - presently patient has no pain. Patient's CAT scan which shows gallstones. Continues with nausea no vomiting. Results of [place] pending.   #4. Anemia - follow CBC and further workup as outpatient.  #5. History of hypertension presently on no medications. SBP range 105-122. #6. History of CVA as per records.     Code Status: full Family Communication:  Disposition Plan: to be determined   Consultants:  none  Procedures:  none  Antibiotics:  none  HPI/Subjective: Lying in bed eyes closed. Responds to verbal stimuli. Reports "hurting all over" . Reports continued nausea.   Objective: Filed Vitals:   02/06/12 0139 02/06/12 0226 02/06/12 0550 02/06/12 0741  BP: 122/46 118/67 105/46 110/50  Pulse: 63 56 57 55  Temp:  98.2 F (36.8 C) 97.5 F (36.4 C) 97.9 F (36.6 C)  TempSrc:  Oral Oral Oral  Resp:  19 18 18   Weight:  71.033 kg (156 lb 9.6 oz)    SpO2: 94% 90% 97% 95%    Intake/Output Summary (Last 24 hours) at 02/06/12 1210 Last data filed at 02/06/12 1100  Gross per 24 hour  Intake    300 ml  Output    450 ml  Net   -150 ml   Filed Weights   02/06/12 0226  Weight: 71.033 kg (156 lb 9.6 oz)    Exam:   General:  Awake, slightly pale, NAD  Cardiovascular:  RRR No MGR No LEE PPP  Respiratory: normal effort. BSCTAB No wheeze/rhochi  Abdomen: soft +BS. Non tender to palpation.  Data Reviewed: Basic Metabolic Panel:  Lab 02/06/12 8119 02/05/12 1657  NA 123* 124*  K 4.0 3.8  CL 92* 90*  CO2 22 24  GLUCOSE 87 106*  BUN 23 27*  CREATININE 1.93* 2.23*  CALCIUM 8.0* 8.5  MG -- --  PHOS -- --   Liver Function Tests:  Lab 02/06/12 0611  AST 10  ALT 8  ALKPHOS 36*  BILITOT 0.2*  PROT 6.2  ALBUMIN 2.6*   No results found for this basename: LIPASE:5,AMYLASE:5 in the last 168 hours No results found for this basename: AMMONIA:5 in the last 168 hours CBC:  Lab 02/06/12 0611 02/05/12 1657  WBC 8.4 8.4  NEUTROABS 5.6 --  HGB 8.6* 9.0*  HCT 24.2* 25.7*  MCV 87.1 86.5  PLT 299 317   Cardiac Enzymes: No results found for this basename: CKTOTAL:5,CKMB:5,CKMBINDEX:5,TROPONINI:5 in the last 168 hours BNP (last 3 results) No results found for this basename: PROBNP:3 in the last 8760 hours CBG: No results found for this basename: GLUCAP:5 in the last 168 hours  No results found for this or any previous visit (from the past 240 hour(s)).   Studies: Ct Abdomen Pelvis Wo Contrast  02/06/2012  *RADIOLOGY REPORT*  Clinical Data: Urinary tract infection.  Fever increased confusion. Left lower quadrant pain.  Diarrhea and nausea.  CT ABDOMEN AND PELVIS WITHOUT CONTRAST  Technique:  Multidetector CT imaging of the abdomen and pelvis was performed following the standard protocol without intravenous contrast.  Comparison: None.  Findings: Mild dependent atelectasis is present in the right lung base.  There is some scarring laterally in the left lower lobe.  The heart size is normal.  Coronary artery calcifications are evident.  A 10 mm hypodense lesion in the left lobe liver is likely benign. The liver is otherwise unremarkable.  The spleen is within normal limits.  The stomach, duodenum, and pancreas are within normal limits.  The common bile duct  is normal.  Layering stones are present in the gallbladder without evidence for inflammation.  The adrenal glands are normal bilaterally.  There is mild stranding about both kidneys without focal inflammation or hydronephrosis. Ureters are within normal limits to the urinary bladder bilaterally.  The urinary bladder is unremarkable.  The rectosigmoid colon is within normal limits.  The remainder of the colon is unremarkable.  The appendix is visualized and within normal limits.  The small bowel is unremarkable.  No significant adenopathy or free fluid is present.  There is some fat herniating into the inguinal canals bilaterally without associated bowel.  Bone windows demonstrate moderate endplate degenerative changes at L3-4, L4-5, and L5-S1.  Slight leftward curvature of the lumbar spine is present.  IMPRESSION:  1.  No acute or focal abnormality to explain the patient's symptoms. 2.  Atherosclerosis. 3.  Cholelithiasis without evidence for cholecystitis. 4.  Atelectasis and scarring at the lung bases. 5.  Spondylosis of the lower lumbar spine. 6.  10 mm hypodense lesion in the left lobe of liver likely represents a simple cyst.   Original Report Authenticated By: Jamesetta Orleans. MATTERN, M.D.    Dg Chest Port 1 View  02/06/2012  *RADIOLOGY REPORT*  Clinical Data: Infiltrates.  Shortness of breath.  PORTABLE CHEST - 1 VIEW  Comparison: None.  Findings: The heart size is exaggerated by low lung volumes. Moderate pulmonary vascular congestion is evident.  There is some tortuosity of the aorta.  Mild dependent atelectasis is present bilaterally.  IMPRESSION:  1.  Low lung volumes and moderate to pulmonary vascular congestion. 2.  Mild bibasilar atelectasis.   Original Report Authenticated By: Jamesetta Orleans. MATTERN, M.D.     Scheduled Meds:   . iohexol  20 mL Oral Q1 Hr x 2  . pantoprazole  40 mg Oral Daily  . sodium chloride  500 mL Intravenous Once   Continuous Infusions:   . sodium chloride    .  DISCONTD: sodium chloride 75 mL/hr at 02/06/12 0138    Principal Problem:  *Weakness Active Problems:  Hyponatremia  Renal failure, unspecified  Anemia    Time spent: 30 minutes    [facility] M  [facility]  If 8PM-8AM, please contact night-coverage at [email or web address], password [facility] 02/06/2012, 12:10 PM  LOS: 1 day

## 2012-02-07 ENCOUNTER — Inpatient Hospital Stay (HOSPITAL_COMMUNITY): Payer: Medicare Other

## 2012-02-07 LAB — BASIC METABOLIC PANEL
GFR calc Af Amer: 40 mL/min — ABNORMAL LOW (ref >90)
GFR calc non Af Amer: 35 mL/min — ABNORMAL LOW (ref >90)
Glucose, Bld: 99 mg/dL (ref 70–99)
Potassium: 4.3 mEq/L (ref 3.5–5.1)
Sodium: 129 mEq/L — ABNORMAL LOW (ref 135–145)

## 2012-02-07 LAB — CBC
Hemoglobin: 8.5 g/dL — ABNORMAL LOW (ref 13.0–17.0)
MCH: 30 pg (ref 26.0–34.0)
RBC: 2.83 MIL/uL — ABNORMAL LOW (ref 4.22–5.81)
WBC: 9.9 10*3/uL (ref 4.0–10.5)

## 2012-02-07 MED ORDER — DIAZEPAM 5 MG PO TABS: 5.0000 mg | ORAL_TABLET | Freq: Two times a day (BID) | ORAL | Status: DC | PRN

## 2012-02-07 MED ORDER — CITALOPRAM HYDROBROMIDE 20 MG PO TABS
20.0000 mg | ORAL_TABLET | Freq: Every day | ORAL | Status: DC
  Administered 2012-02-07 – 2012-02-11 (×5): 20 mg via ORAL
  Filled 2012-02-07 (×5): qty 1

## 2012-02-07 MED ORDER — HYDROXYZINE HCL 10 MG PO TABS
10.0000 mg | ORAL_TABLET | Freq: Three times a day (TID) | ORAL | Status: DC
  Administered 2012-02-07 – 2012-02-11 (×13): 10 mg via ORAL
  Filled 2012-02-07 (×15): qty 1

## 2012-02-07 MED ORDER — LEVOFLOXACIN IN D5W 750 MG/150ML IV SOLN
750.0000 mg | INTRAVENOUS | Status: DC
  Administered 2012-02-07 – 2012-02-11 (×3): 750 mg via INTRAVENOUS
  Filled 2012-02-07 (×3): qty 150

## 2012-02-07 NOTE — Progress Notes (Signed)
ANTIBIOTIC CONSULT NOTE - INITIAL  Pharmacy Consult for Levaquin Indication: rule out pneumonia  Allergies  Allergen Reactions  . Ambien (Zolpidem Tartrate) Other (See Comments)    Unknown reaction  . Penicillins Other (See Comments)    Unknown reaction    Patient Measurements: Weight: 157 lb 10.1 oz (71.5 kg)   Vital Signs: Temp: 97.9 F (36.6 C) (10/31 0525) Temp src: Oral (10/31 0525) BP: 102/44 mmHg (10/31 0525) Pulse Rate: 57  (10/31 0525) Intake/Output from previous day: 10/30 0701 - 10/31 0700 In: 1380 [P.O.:460; I.V.:920] Out: 525 [Urine:525] Intake/Output from this shift:    Labs:  Basename 02/07/12 0520 02/06/12 1652 02/06/12 1249 02/06/12 0611 02/05/12 1657  WBC 9.9 -- -- 8.4 8.4  HGB 8.5* -- -- 8.6* 9.0*  PLT 285 -- -- 299 317  LABCREA -- -- -- -- --  CREATININE 1.63* 1.80* 1.82* -- --   CrCl is unknown because there is no height on file for the current visit. No results found for this basename: VANCOTROUGH:2,VANCOPEAK:2,VANCORANDOM:2,GENTTROUGH:2,GENTPEAK:2,GENTRANDOM:2,TOBRATROUGH:2,TOBRAPEAK:2,TOBRARND:2,AMIKACINPEAK:2,AMIKACINTROU:2,AMIKACIN:2, in the last 72 hours   Microbiology: Recent Results (from the past 720 hour(s))  CULTURE, BLOOD (ROUTINE X 2)     Status: Normal (Preliminary result)   Collection Time   02/06/12  1:30 PM      Component Value Range Status Comment   Specimen Description BLOOD RIGHT ANTECUBITAL   Final    Special Requests BOTTLES DRAWN AEROBIC ONLY 8CC   Final    Culture  Setup Time 02/06/2012 18:13   Final    Culture     Final    Value:        BLOOD CULTURE RECEIVED NO GROWTH TO DATE CULTURE WILL BE HELD FOR 5 DAYS BEFORE ISSUING A FINAL NEGATIVE REPORT   Report Status PENDING   Incomplete   CULTURE, BLOOD (ROUTINE X 2)     Status: Normal (Preliminary result)   Collection Time   02/06/12  1:45 PM      Component Value Range Status Comment   Specimen Description BLOOD RIGHT HAND   Final    Special Requests BOTTLES DRAWN  AEROBIC ONLY 6CC   Final    Culture  Setup Time 02/06/2012 18:13   Final    Culture     Final    Value:        BLOOD CULTURE RECEIVED NO GROWTH TO DATE CULTURE WILL BE HELD FOR 5 DAYS BEFORE ISSUING A FINAL NEGATIVE REPORT   Report Status PENDING   Incomplete     Medical History: Past Medical History  Diagnosis Date  . Stroke   . Acute bronchitis   . Hypertension     Assessment:  78 y.o. F who presented to the [facility] on 10/30 with weakness and confusion. Pharmacy was consulted to start Levaquin for empiric PNA coverage. Blood cultures show ngtd. SCr 1.63, CrCl~20-25 ml/min.   Goal of Therapy:  Proper antibiotics for infection/cultures adjusted for renal/hepatic function   Plan:  1. Levaquin 750 mg IV every 48 hours 2. Will continue to follow renal function, culture results, LOT, and antibiotic de-escalation plans   Georgina Pillion, PharmD, BCPS Clinical Pharmacist Pager: [phone] 02/07/2012 11:19 AM

## 2012-02-07 NOTE — Progress Notes (Signed)
[facility] PROGRESS NOTE  ICHOLAS IRBY [name]:[id number] DOB: 23-Nov-1916 DOA: 02/05/2012 PCP: No primary provider on file.  Assessment/Plan: #1. Generalized weakness with fatigue and hyponatremia -  Likely due to dehydration. Slight improvement.. Sodium improved to 129. Will continue with gentle IV hydration with saline.Urine sodium 51 urine osmolality 268. Continue bmet's to follow trend. TSH 0.92.  #2. Renal failure - not sure if his acute or chronic but given the anemia patient probably could be having chronic renal failure. Creatinine trending down.  UA is unremarkable. Closely follow metabolic panel.  #3. Nausea with abdominal discomfort - presently patient has no pain. Patient's CAT scan which shows gallstones. Abdominal  US yields multiple mobile gallstones without findings of cholecystitis. Questionable common bile duct stone on recent CT not appreciated on the present ultrasound. Intrahepatic biliary duct prominence may be related to the patient's age rather than obstruction. Correlation with liver function studies recommended. Medical renal disease type changes of the kidneys which appear echogenic with decreased renal parenchyma. No hydronephrosis. Albumin 2.6.Alk Phos 36.  #4. Anemia - stable. follow CBC and further workup as outpatient.  #5. History of hypertension presently on no medications. SBP range 102-123.  #6. History of CVA as per records  Code Status: full Family Communication:  Disposition Plan: to be determinded   Consultants:  none  Procedures:  none  Antibiotics:  none  HPI/Subjective: Awake. Complains of being cold all night, "sick as a dog, i need something for cold/flu". Complains pain "all over"  Objective: Filed Vitals:   02/06/12 1458 02/06/12 1800 02/06/12 2113 02/07/12 0525  BP: 113/66 117/67 123/53 102/44  Pulse: 70 78 58 57  Temp: 98.2 F (36.8 C) 97.5 F (36.4 C) 97.3 F (36.3 C) 97.9 F (36.6 C)  TempSrc: Oral Oral Oral Oral    Resp: 18 16 18 18   Weight:   71.5 kg (157 lb 10.1 oz)   SpO2: 99% 100% 98% 98%    Intake/Output Summary (Last 24 hours) at 02/07/12 0757 Last data filed at 02/07/12 0520  Gross per 24 hour  Intake   1380 ml  Output    525 ml  Net    855 ml   Filed Weights   02/06/12 0226 02/06/12 2113  Weight: 71.033 kg (156 lb 9.6 oz) 71.5 kg (157 lb 10.1 oz)    Exam:   General:  Awake alert NAD  Cardiovascular: RRR No MGR No LEE PPP  Respiratory: normal effort. BSCTAB No wheeze/rhonchi  Abdomen: soft +BS non-tender.   Data Reviewed: Basic Metabolic Panel:  Lab 02/07/12 9562 02/06/12 1652 02/06/12 1249 02/06/12 0611 02/05/12 1657  NA 129* 129* 129* 123* 124*  K 4.3 4.5 4.2 4.0 3.8  CL 99 97 96 92* 90*  CO2 19 21 20 22 24   GLUCOSE 99 90 81 87 106*  BUN 21 21 21 23  27*  CREATININE 1.63* 1.80* 1.82* 1.93* 2.23*  CALCIUM 8.2* 8.4 8.5 8.0* 8.5  MG -- -- -- -- --  PHOS -- -- -- -- --   Liver Function Tests:  Lab 02/06/12 0611  AST 10  ALT 8  ALKPHOS 36*  BILITOT 0.2*  PROT 6.2  ALBUMIN 2.6*   No results found for this basename: LIPASE:5,AMYLASE:5 in the last 168 hours No results found for this basename: AMMONIA:5 in the last 168 hours CBC:  Lab 02/07/12 0520 02/06/12 0611 02/05/12 1657  WBC 9.9 8.4 8.4  NEUTROABS -- 5.6 --  HGB 8.5* 8.6* 9.0*  HCT 24.6* 24.2*  25.7*  MCV 86.9 87.1 86.5  PLT 285 299 317   Cardiac Enzymes: No results found for this basename: CKTOTAL:5,CKMB:5,CKMBINDEX:5,TROPONINI:5 in the last 168 hours BNP (last 3 results) No results found for this basename: PROBNP:3 in the last 8760 hours CBG: No results found for this basename: GLUCAP:5 in the last 168 hours  No results found for this or any previous visit (from the past 240 hour(s)).   Studies: Ct Abdomen Pelvis Wo Contrast  02/06/2012  *RADIOLOGY REPORT*  Clinical Data: Urinary tract infection.  Fever increased confusion. Left lower quadrant pain.  Diarrhea and nausea.  CT ABDOMEN AND PELVIS  WITHOUT CONTRAST  Technique:  Multidetector CT imaging of the abdomen and pelvis was performed following the standard protocol without intravenous contrast.  Comparison: None.  Findings: Mild dependent atelectasis is present in the right lung base.  There is some scarring laterally in the left lower lobe.  The heart size is normal.  Coronary artery calcifications are evident.  A 10 mm hypodense lesion in the left lobe liver is likely benign. The liver is otherwise unremarkable.  The spleen is within normal limits.  The stomach, duodenum, and pancreas are within normal limits.  The common bile duct is normal.  Layering stones are present in the gallbladder without evidence for inflammation.  The adrenal glands are normal bilaterally.  There is mild stranding about both kidneys without focal inflammation or hydronephrosis. Ureters are within normal limits to the urinary bladder bilaterally.  The urinary bladder is unremarkable.  The rectosigmoid colon is within normal limits.  The remainder of the colon is unremarkable.  The appendix is visualized and within normal limits.  The small bowel is unremarkable.  No significant adenopathy or free fluid is present.  There is some fat herniating into the inguinal canals bilaterally without associated bowel.  Bone windows demonstrate moderate endplate degenerative changes at L3-4, L4-5, and L5-S1.  Slight leftward curvature of the lumbar spine is present.  IMPRESSION:  1.  No acute or focal abnormality to explain the patient's symptoms. 2.  Atherosclerosis. 3.  Cholelithiasis without evidence for cholecystitis. 4.  Atelectasis and scarring at the lung bases. 5.  Spondylosis of the lower lumbar spine. 6.  10 mm hypodense lesion in the left lobe of liver likely represents a simple cyst.   Original Report Authenticated By: Russell Orleans. [name], M.D.    US Abdomen Complete  02/06/2012  *RADIOLOGY REPORT*  Clinical Data:  .  Abdominal pain.  Renal failure.  COMPLETE ABDOMINAL  ULTRASOUND  Comparison:  02/06/2012.  Findings:  Gallbladder:  Multiple tiny mobile gallstones.  Gallbladder wall does not appear thickened.  No pericholecystic fluid.  The patient was not tender over this region during scanning.  Common bile duct:  5.2 mm.  In retrospect on the CT scan there may be a tiny common bile duct stone not appreciated on the present exam.  Liver:  Left lobe liver 1.3 cm cyst.  Intrahepatic bile duct prominence may be related to the patient's age rather than obstruction.  Correlation with laboratory studies recommended.  IVC:  Not visualized secondary to bowel gas.  Pancreas:  Not visualized secondary to bowel gas.  Spleen:  3.7 cm without focal mass.  Right Kidney:  10.0 cm.  Echogenic consistent with medical renal disease type changes. No hydronephrosis or renal mass.  Left Kidney:  9.1 cm.  Echogenic consistent with medical renal disease type changes. No hydronephrosis or renal mass.  Abdominal aorta:  Portions not well delineated  secondary to bowel gas.  Atherosclerotic type changes with mild ectasia measuring up to 2.3 cm.  IMPRESSION: Multiple mobile gallstones without findings of cholecystitis.  Questionable common bile duct stone on recent CT not appreciated on the present ultrasound.  Intrahepatic biliary duct prominence may be related to the patient's age rather than obstruction.  Correlation with liver function studies recommended.  Medical renal disease type changes of the kidneys which appear echogenic with decreased renal parenchyma.  No hydronephrosis.  Secondary to bowel gas, poor delineation of the inferior vena cava, pancreas and portions of the abdominal aorta appear   Original Report Authenticated By: Fuller Canada, M.D.    Dg Chest Port 1 View  02/06/2012  *RADIOLOGY REPORT*  Clinical Data: Infiltrates.  Shortness of breath.  PORTABLE CHEST - 1 VIEW  Comparison: None.  Findings: The heart size is exaggerated by low lung volumes. Moderate pulmonary vascular  congestion is evident.  There is some tortuosity of the aorta.  Mild dependent atelectasis is present bilaterally.  IMPRESSION:  1.  Low lung volumes and moderate to pulmonary vascular congestion. 2.  Mild bibasilar atelectasis.   Original Report Authenticated By: Russell Orleans. [name], M.D.     Scheduled Meds:   . pantoprazole  40 mg Oral Daily   Continuous Infusions:   . sodium chloride 75 mL/hr at 02/06/12 1544    Principal Problem:  *Weakness Active Problems:  Hyponatremia  Renal failure, unspecified  Anemia    Time spent: 30 minutes    [facility] M NP [facility]  If 8PM-8AM, please contact night-coverage at [email or web address], password [facility] 02/07/2012, 7:57 AM  LOS: 2 days

## 2012-02-07 NOTE — Evaluation (Signed)
Physical Therapy Evaluation Patient Details Name: Russell [name] MRN: [id number] DOB: 29-Apr-1916 Today's Date: 02/07/2012 Time: 1308-6578 PT Time Calculation (min): 35 min  PT Assessment / Plan / Recommendation Clinical Impression  pt presents with Hyponatremia, Weakness, and N/V.  pt with some cognitive deficits causing pt to be upset and thinks he has been here for a week and hasn't gotten any medications and no one has come to see him.  Reassured pt RN and MD have been taking care of pt and providing medications.  pt wanting to return to [facility], but may need increased A with ADLs at D/C and HHPT.      PT Assessment  Patient needs continued PT services    Follow Up Recommendations  Home health PT;Supervision/Assistance - 24 hour (if ALF can provide, otherwise may need to consider SNF)    Does the patient have the potential to tolerate intense rehabilitation      Barriers to Discharge None      Equipment Recommendations  None recommended by PT    Recommendations for Other Services     Frequency Min 3X/week    Precautions / Restrictions Precautions Precautions: Fall Restrictions Weight Bearing Restrictions: No   Pertinent Vitals/Pain Denies pain.        Mobility  Bed Mobility Bed Mobility: Supine to Sit;Sitting - Scoot to Edge of Bed;Sit to Supine Supine to Sit: 5: Supervision;With rails;HOB elevated Sitting - Scoot to Edge of Bed: 5: Supervision Sit to Supine: 5: Supervision Details for Bed Mobility Assistance: pt moves slow and utilizes bed rail to perform without A.   Transfers Transfers: Sit to Stand;Stand to Sit Sit to Stand: 4: Min guard;With upper extremity assist;From bed Stand to Sit: 4: Min guard;With upper extremity assist;To bed Details for Transfer Assistance: cues for UE use and to get closer to [facility] prior to sitting.   Ambulation/Gait Ambulation/Gait Assistance: 4: Min guard Ambulation Distance (Feet): 100 Feet Assistive device: Rolling  walker Ambulation/Gait Assistance Details: pt remains in flexed posture, cues to stay closer to RW, however pt is acustomed to 4WW.  pt fatigues quickly.   Gait Pattern: Step-through pattern;Decreased stride length;Trunk flexed Stairs: No Wheelchair Mobility Wheelchair Mobility: No    Shoulder Instructions     Exercises     PT Diagnosis: Difficulty walking  PT Problem List: Decreased activity tolerance;Decreased balance;Decreased mobility;Decreased cognition;Decreased knowledge of use of DME;Decreased safety awareness PT Treatment Interventions: DME instruction;Gait training;Functional mobility training;Therapeutic activities;Therapeutic exercise;Balance training;Cognitive remediation;Patient/family education   PT Goals Acute Rehab PT Goals PT Goal Formulation: With patient Time For Goal Achievement: 02/21/12 Potential to Achieve Goals: Good Pt will go Supine/Side to Sit: with modified independence PT Goal: Supine/Side to Sit - Progress: Goal set today Pt will go Sit to Supine/Side: with modified independence PT Goal: Sit to Supine/Side - Progress: Goal set today Pt will go Sit to Stand: with modified independence PT Goal: Sit to Stand - Progress: Goal set today Pt will go Stand to Sit: with modified independence PT Goal: Stand to Sit - Progress: Goal set today Pt will Ambulate: >150 feet;with modified independence;with rolling walker PT Goal: Ambulate - Progress: Goal set today  Visit Information  Last PT Received On: 02/07/12 Assistance Needed: +1    Subjective Data  Subjective: I can't stay here the way they are treating me!   Patient Stated Goal: Back to [facility]   Prior Functioning  Home Living Lives With: Alone Available Help at Discharge:  (ALF- [facility]) Type of Home:  Assisted living Home Access: Level entry Home Layout: One level Home Adaptive Equipment: Dan Humphreys - four wheeled;Art gallery manager Prior Function Level of Independence: Needs  assistance Needs Assistance: Bathing;Meal Prep;Light Housekeeping;Gait Bath: Supervision/set-up Meal Prep: Total Light Housekeeping: Total Gait Assistance: Per staff at [facility], pt used [phone] for short distances and then scooter long distances.   Able to Take Stairs?: No Driving: No Vocation: Retired Musician: No difficulties    Cognition  Overall Cognitive Status: Impaired Area of Impairment: Memory;Problem solving;Safety/judgement Arousal/Alertness: Awake/alert Orientation Level: Disoriented to;Time;Situation Behavior During Session: [facility] for tasks performed Memory Deficits: pt with decrease recall of events of past week.  Good immediate retention once oriented able to recall date 5 mins later.   Safety/Judgement: Decreased safety judgement for tasks assessed;Decreased awareness of need for assistance Problem Solving: pt slow to process.   Cognition - Other Comments: pt making comments about unhooking cords so he can get up by himself.  pt ed about calling Nsg and pt verbalized how to call for nsg, but question if he will follow through.      Extremity/Trunk Assessment Right Lower Extremity Assessment RLE ROM/Strength/Tone: WFL for tasks assessed RLE Sensation: WFL - Light Touch Left Lower Extremity Assessment LLE ROM/Strength/Tone: WFL for tasks assessed LLE Sensation: WFL - Light Touch Trunk Assessment Trunk Assessment: Kyphotic   Balance Balance Balance Assessed: No  End of Session PT - End of Session Equipment Utilized During Treatment: Gait belt Activity Tolerance: Patient limited by fatigue Patient left: in bed;with call bell/phone within reach;with bed alarm set Nurse Communication: Mobility status  GP     Sunny Schlein, PT [phone] 02/07/2012, 10:59 AM

## 2012-02-07 NOTE — Progress Notes (Signed)
Occupational Therapy Evaluation Patient Details Name: Russell [name] MRN: [id number] DOB: 1916/10/19 Today's Date: 02/07/2012 Time: 1532-1600 OT Time Calculation (min): 28 min  OT Assessment / Plan / Recommendation Clinical Impression  78 yo with recent admission due to hyponatremia, weakness and n/v. PTA, pt lived at ALF, [facility]. Per this pm eval, pt unsafe to D/C to ALF and is high risk for falls. ? if pt was sundowning this afternoon as compared to this am PT session. Rec rehab at SNF with goal to return to ALF. If pt chooses to D/C to ALF, he will need 24/7 asistance.    OT Assessment  Patient needs continued OT Services    Follow Up Recommendations  Skilled nursing facility    Barriers to Discharge      Equipment Recommendations  None recommended by OT    Recommendations for Other Services    Frequency  Min 2X/week    Precautions / Restrictions Precautions Precautions: Fall   Pertinent Vitals/Pain none    ADL  Eating/Feeding: Independent Grooming: Set up Upper Body Bathing: Set up Lower Body Bathing: Maximal assistance Where Assessed - Lower Body Bathing: Supported sit to stand Upper Body Dressing: Performed;Minimal assistance Where Assessed - Upper Body Dressing: Supported sitting Lower Body Dressing: Maximal assistance Where Assessed - Lower Body Dressing: Supported sit to Pharmacist, hospital: Moderate assistance Toilet Transfer Method: Sit to stand;Stand pivot Toilet Transfer Equipment: Comfort height toilet Toileting - Clothing Manipulation and Hygiene: Supervision/safety Where Assessed - Toileting Clothing Manipulation and Hygiene: Sit to stand from 3-in-1 or toilet Equipment Used: Gait belt Transfers/Ambulation Related to ADLs: Mod A RW level ADL Comments: Pt unsafe during this pm eval, demonstrating behavior putting him at high risk for falls. PT overstepping floor when nothing on floor, pushing walker out extremly far in from of him, not  reaching for support and trying to sit before reaching toilet.    OT Diagnosis: Generalized weakness;Altered mental status  OT Problem List: Decreased strength;Decreased activity tolerance;Impaired balance (sitting and/or standing);Decreased coordination;Decreased cognition;Decreased safety awareness;Decreased knowledge of use of DME or AE;Decreased knowledge of precautions OT Treatment Interventions: Self-care/ADL training;Therapeutic exercise;Energy conservation;DME and/or AE instruction;Therapeutic activities;Cognitive remediation/compensation;Patient/family education;Balance training   OT Goals Acute Rehab OT Goals OT Goal Formulation: Patient unable to participate in goal setting Time For Goal Achievement: 02/21/12 Potential to Achieve Goals: Good ADL Goals Pt Will Perform Upper Body Bathing: with supervision;Standing at sink;Supported ADL Goal: Product manager - Progress: Goal set today Pt Will Perform Lower Body Bathing: with supervision;Sit to stand from chair;Supported;with cueing (comment type and amount) ADL Goal: Lower Body Bathing - Progress: Goal set today Pt Will Perform Upper Body Dressing: with set-up;Unsupported;Sitting, bed ADL Goal: Upper Body Dressing - Progress: Goal set today Pt Will Perform Lower Body Dressing: with min assist;Sit to stand from bed;Supported;with cueing (comment type and amount) ADL Goal: Lower Body Dressing - Progress: Goal set today Pt Will Transfer to Toilet: with min assist;Ambulation;with DME ADL Goal: Toilet Transfer - Progress: Goal set today Pt Will Perform Toileting - Clothing Manipulation: with modified independence;Sitting on 3-in-1 or toilet;Standing ADL Goal: Toileting - Clothing Manipulation - Progress: Goal set today  Visit Information  Last OT Received On: 02/07/12 Assistance Needed: +1    Subjective Data      Prior Functioning     Home Living Lives With: Alone Available Help at Discharge: Other (Comment) (ALF) Type of  Home: Assisted living Home Access: Level entry Home Layout: One level Home Adaptive Equipment: Walker - four  wheeled;Art gallery manager Prior Function Level of Independence: Needs assistance Needs Assistance: Bathing;Meal Prep;Light Housekeeping;Gait Bath: Supervision/set-up Meal Prep: Total Light Housekeeping: Total Gait Assistance: Per staff at [facility], pt used [phone] for short distances and then scooter long distances.   Able to Take Stairs?: No Driving: No Vocation: Retired Musician: No difficulties Dominant Hand: Right         Vision/Perception     Cognition  Overall Cognitive Status: Impaired Area of Impairment: Attention;Memory;Safety/judgement;Awareness of errors;Awareness of deficits;Problem solving;Executive functioning Arousal/Alertness: Awake/alert Orientation Level: Disoriented to;Time;Situation Behavior During Session: WFL for tasks performed Current Attention Level: Sustained Attention - Other Comments: internally distracted Memory: Decreased recall of precautions Memory Deficits: decreased STM Safety/Judgement: Impulsive;Decreased awareness of need for assistance Safety/Judgement - Other Comments: poor judgement during ADL Awareness of Errors: Assistance required to identify errors made;Assistance required to correct errors made Awareness of Errors - Other Comments: unaware that he was pushing walker about 3 feet in front of him Awareness of Deficits: emergent awareness Problem Solving: pt slow to process.   Executive Functioning: decresaed self inhibition    Extremity/Trunk Assessment Right Upper Extremity Assessment RUE ROM/Strength/Tone: WFL for tasks assessed Left Upper Extremity Assessment LUE ROM/Strength/Tone: WFL for tasks assessed     Mobility Bed Mobility Bed Mobility: Supine to Sit;Sitting - Scoot to Edge of Bed Supine to Sit: 4: Min assist;HOB flat Sitting - Scoot to [place] of Bed: 4: Min  assist Transfers Transfers: Sit to Stand;Stand to Sit Sit to Stand: 3: Mod assist;With upper extremity assist;From bed Stand to Sit: 4: Min assist;With upper extremity assist;To toilet Details for Transfer Assistance: unsafe     Shoulder Instructions     Exercise     Balance  mod A   End of Session OT - End of Session Equipment Utilized During Treatment: Gait belt Activity Tolerance: Patient tolerated treatment well Patient left: in bed;with call bell/phone within reach;with nursing in room Nurse Communication: Mobility status  GO     Dreshawn Hendershott,HILLARY 02/07/2012, 4:27 PM [facility], OTR/L  [phone] 02/07/2012

## 2012-02-07 NOTE — Progress Notes (Addendum)
Patient seen and examined by me.  Recently moved to an ALF where he has not been getting out of bed or eating dinner.  Will recheck 2 view x-ray to r/o PNA now that patient has recieved IVF.  Plan to d/c tomm back to SNF.  No fever. Spoke with son on his cell phone.  Updated son with the results of chest x ray- agreeable to treat with antibiotics and do follow up chest x ray to see if resolution occurs Marlin Canary DO

## 2012-02-08 DIAGNOSIS — J189 Pneumonia, unspecified organism: Secondary | ICD-10-CM | POA: Diagnosis present

## 2012-02-08 DIAGNOSIS — F05 Delirium due to known physiological condition: Secondary | ICD-10-CM

## 2012-02-08 DIAGNOSIS — R918 Other nonspecific abnormal finding of lung field: Secondary | ICD-10-CM | POA: Diagnosis present

## 2012-02-08 LAB — BASIC METABOLIC PANEL
BUN: 19 mg/dL (ref 6–23)
GFR calc Af Amer: 39 mL/min — ABNORMAL LOW (ref >90)
GFR calc non Af Amer: 33 mL/min — ABNORMAL LOW (ref >90)
Potassium: 4.6 mEq/L (ref 3.5–5.1)
Sodium: 126 mEq/L — ABNORMAL LOW (ref 135–145)

## 2012-02-08 LAB — CBC
HCT: 23.6 % — ABNORMAL LOW (ref 39.0–52.0)
MCHC: 35.2 g/dL (ref 30.0–36.0)
RDW: 14.4 % (ref 11.5–15.5)

## 2012-02-08 NOTE — Progress Notes (Signed)
[facility] PROGRESS NOTE  Russell [name] UEA:[id number] DOB: 1916/05/27 DOA: 02/05/2012 PCP: No primary provider on file.  Assessment/Plan: #1. Generalized weakness with fatigue and hyponatremia -  Likely due to dehydration/lung involement. Slight improvement..  . Continue bmet's to follow trend. TSH 0.92.  #2. Renal failure - not sure if his acute or chronic but given the anemia patient probably could be having chronic renal failure. Creatinine trending down.  UA is unremarkable. Closely follow metabolic panel.  #3. Nausea with abdominal discomfort - presently patient has no pain. Patient's CAT scan which shows gallstones. Abdominal  US yields multiple mobile gallstones without findings of cholecystitis. Questionable common bile duct stone on recent CT not appreciated on the present ultrasound. Intrahepatic biliary duct prominence may be related to the patient's age rather than obstruction. Correlation with liver function studies recommended. Medical renal disease type changes of the kidneys which appear echogenic with decreased renal parenchyma. No hydronephrosis. Albumin 2.6.Alk Phos 36.  #4. Anemia - stable. follow CBC and further workup as outpatient.  #5. History of hypertension presently on no medications. SBP range 102-123.  #6. History of CVA as per records #7 PNA/lung mass- continue levaquin IV for now, plan to get follow up chest x ray for resolution, no CT scan for now for eval of possible mass-   Code Status: full Family Communication: son- LM Disposition Plan: to be determinded- ? Back to [facility] with assistance vs SNF   Consultants:  none  Procedures:  none  Antibiotics:  none  HPI/Subjective: Wants to go back to [facility] No new c/o- says he has outlived his life expectations   Objective: Filed Vitals:   02/07/12 1739 02/07/12 2101 02/08/12 0518 02/08/12 0902  BP: 107/54 128/49 119/39 107/47  Pulse: 113 50 67 75  Temp: 97.8 F (36.6 C)  97.9 F (36.6 C) 98 F (36.7 C) 97.8 F (36.6 C)  TempSrc: Oral Oral Oral   Resp: 18 18 18 17   Weight:  71.9 kg (158 lb 8.2 oz)    SpO2: 94% 100% 97% 96%    Intake/Output Summary (Last 24 hours) at 02/08/12 1036 Last data filed at 02/08/12 0902  Gross per 24 hour  Intake    240 ml  Output      0 ml  Net    240 ml   Filed Weights   02/06/12 0226 02/06/12 2113 02/07/12 2101  Weight: 71.033 kg (156 lb 9.6 oz) 71.5 kg (157 lb 10.1 oz) 71.9 kg (158 lb 8.2 oz)    Exam:   General:  Awake alert NAD  Cardiovascular: RRR No MGR No LEE PPP  Respiratory: normal effort. BSCTAB No wheeze/rhonchi  Abdomen: soft +BS non-tender.   Data Reviewed: Basic Metabolic Panel:  Lab 02/08/12 4782 02/07/12 0520 02/06/12 1652 02/06/12 1249 02/06/12 0611  NA 126* 129* 129* 129* 123*  K 4.6 4.3 4.5 4.2 4.0  CL 96 99 97 96 92*  CO2 17* 19 21 20 22   GLUCOSE 100* 99 90 81 87  BUN 19 21 21 21 23   CREATININE 1.68* 1.63* 1.80* 1.82* 1.93*  CALCIUM 8.4 8.2* 8.4 8.5 8.0*  MG -- -- -- -- --  PHOS -- -- -- -- --   Liver Function Tests:  Lab 02/06/12 0611  AST 10  ALT 8  ALKPHOS 36*  BILITOT 0.2*  PROT 6.2  ALBUMIN 2.6*   No results found for this basename: LIPASE:5,AMYLASE:5 in the last 168 hours No results found for this basename: AMMONIA:5  in the last 168 hours CBC:  Lab 02/08/12 0600 02/07/12 0520 02/06/12 0611 02/05/12 1657  WBC 8.5 9.9 8.4 8.4  NEUTROABS -- -- 5.6 --  HGB 8.3* 8.5* 8.6* 9.0*  HCT 23.6* 24.6* 24.2* 25.7*  MCV 86.8 86.9 87.1 86.5  PLT 251 285 299 317   Cardiac Enzymes: No results found for this basename: CKTOTAL:5,CKMB:5,CKMBINDEX:5,TROPONINI:5 in the last 168 hours BNP (last 3 results) No results found for this basename: PROBNP:3 in the last 8760 hours CBG: No results found for this basename: GLUCAP:5 in the last 168 hours  Recent Results (from the past 240 hour(s))  CULTURE, BLOOD (ROUTINE X 2)     Status: Normal (Preliminary result)   Collection Time    02/06/12  1:30 PM      Component Value Range Status Comment   Specimen Description BLOOD RIGHT ANTECUBITAL   Final    Special Requests BOTTLES DRAWN AEROBIC ONLY 8CC   Final    Culture  Setup Time 02/06/2012 18:13   Final    Culture     Final    Value:        BLOOD CULTURE RECEIVED NO GROWTH TO DATE CULTURE WILL BE HELD FOR 5 DAYS BEFORE ISSUING A FINAL NEGATIVE REPORT   Report Status PENDING   Incomplete   CULTURE, BLOOD (ROUTINE X 2)     Status: Normal (Preliminary result)   Collection Time   02/06/12  1:45 PM      Component Value Range Status Comment   Specimen Description BLOOD RIGHT HAND   Final    Special Requests BOTTLES DRAWN AEROBIC ONLY 6CC   Final    Culture  Setup Time 02/06/2012 18:13   Final    Culture     Final    Value:        BLOOD CULTURE RECEIVED NO GROWTH TO DATE CULTURE WILL BE HELD FOR 5 DAYS BEFORE ISSUING A FINAL NEGATIVE REPORT   Report Status PENDING   Incomplete      Studies: Dg Chest 2 View  02/07/2012  *RADIOLOGY REPORT*  Clinical Data: Pneumonia, shortness of breath.  CHEST - 2 VIEW  Comparison: 02/06/2012  Findings: Findings concerning for possible left hilar or perihilar mass.  Increasing density in the left upper lobe perihilar region. While the above findings could reflect pneumonia, I cannot exclude central obstructing mass with postobstructive process.  Recommend CT with IV contrast for further evaluation.  Mild cardiomegaly.  Bibasilar atelectasis.  No effusions or acute bony abnormality.  IMPRESSION: Findings concerning for possible left hilar/perihilar mass and postobstructive process.  Recommend chest CT with IV contrast for further evaluation.   Original Report Authenticated By: Charlett Nose, M.D.    US Abdomen Complete  02/06/2012  *RADIOLOGY REPORT*  Clinical Data:  .  Abdominal pain.  Renal failure.  COMPLETE ABDOMINAL ULTRASOUND  Comparison:  02/06/2012.  Findings:  Gallbladder:  Multiple tiny mobile gallstones.  Gallbladder wall does not appear  thickened.  No pericholecystic fluid.  The patient was not tender over this region during scanning.  Common bile duct:  5.2 mm.  In retrospect on the CT scan there may be a tiny common bile duct stone not appreciated on the present exam.  Liver:  Left lobe liver 1.3 cm cyst.  Intrahepatic bile duct prominence may be related to the patient's age rather than obstruction.  Correlation with laboratory studies recommended.  IVC:  Not visualized secondary to bowel gas.  Pancreas:  Not visualized secondary to bowel gas.  Spleen:  3.7 cm without focal mass.  Right Kidney:  10.0 cm.  Echogenic consistent with medical renal disease type changes. No hydronephrosis or renal mass.  Left Kidney:  9.1 cm.  Echogenic consistent with medical renal disease type changes. No hydronephrosis or renal mass.  Abdominal aorta:  Portions not well delineated secondary to bowel gas.  Atherosclerotic type changes with mild ectasia measuring up to 2.3 cm.  IMPRESSION: Multiple mobile gallstones without findings of cholecystitis.  Questionable common bile duct stone on recent CT not appreciated on the present ultrasound.  Intrahepatic biliary duct prominence may be related to the patient's age rather than obstruction.  Correlation with liver function studies recommended.  Medical renal disease type changes of the kidneys which appear echogenic with decreased renal parenchyma.  No hydronephrosis.  Secondary to bowel gas, poor delineation of the inferior vena cava, pancreas and portions of the abdominal aorta appear   Original Report Authenticated By: Fuller Canada, M.D.     Scheduled Meds:    . citalopram  20 mg Oral Daily  . hydrOXYzine  10 mg Oral TID  . levofloxacin (LEVAQUIN) IV  750 mg Intravenous Q48H  . pantoprazole  40 mg Oral Daily   Continuous Infusions:   Principal Problem:  *Weakness Active Problems:  Hyponatremia  Renal failure, unspecified  Anemia    Time spent: 30 minutes    VANN, JESSICA  [facility]  If 8PM-8AM, please contact night-coverage at [email or web address], password [email or web address] 02/08/2012, 10:36 AM  LOS: 3 days

## 2012-02-08 NOTE — Progress Notes (Signed)
Physical Therapy Treatment Patient Details Name: Russell [name] MRN: [id number] DOB: April 30, 1916 Today's Date: 02/08/2012 Time: 3086-5784 PT Time Calculation (min): 23 min  PT Assessment / Plan / Recommendation Comments on Treatment Session  Pt. seemed uanware of some of his current circumstances such as being concerned that the insurance information had not been made available.  Safety is still an issue regarding his mobility, and he will require assist at least at this time.      Follow Up Recommendations  Home health PT;Supervision/Assistance - 24 hour;Other (comment) (as long as ALF can provide, otherwise SNF)     Does the patient have the potential to tolerate intense rehabilitation     Barriers to Discharge        Equipment Recommendations  None recommended by PT    Recommendations for Other Services    Frequency Min 3X/week   Plan Discharge plan remains appropriate    Precautions / Restrictions Precautions Precautions: Fall Restrictions Weight Bearing Restrictions: No   Pertinent Vitals/Pain No pain, no distress    Mobility  Bed Mobility Bed Mobility: Supine to Sit;Sitting - Scoot to Edge of Bed Supine to Sit: 4: Min assist;HOB flat Sitting - Scoot to [place] of Bed: 4: Min assist Sit to Supine: 5: Supervision Details for Bed Mobility Assistance: moves slowly with aid of bedrail Transfers Transfers: Sit to Stand;Stand to Sit Sit to Stand: 3: Mod assist;With upper extremity assist;From bed Stand to Sit: 4: Min assist;With upper extremity assist;To bed Details for Transfer Assistance: saafety cues, especially as he approached bed after he walked.  Needed cues for continued use of RW until he sits and to keep RW with him as he is turning Ambulation/Gait Ambulation/Gait Assistance: 4: Min assist Ambulation Distance (Feet): 100 Feet Assistive device: Rolling walker Ambulation/Gait Assistance Details: verbal and manual cues for upright posture, assist to manage RW as he  tends to place too far in front of him Gait Pattern: Step-through pattern;Decreased stride length;Trunk flexed    Exercises     PT Diagnosis:    PT Problem List:   PT Treatment Interventions:     PT Goals Acute Rehab PT Goals PT Goal: Supine/Side to Sit - Progress: Progressing toward goal PT Goal: Sit to Supine/Side - Progress: Progressing toward goal PT Goal: Sit to Stand - Progress: Progressing toward goal PT Goal: Stand to Sit - Progress: Progressing toward goal PT Goal: Ambulate - Progress: Progressing toward goal  Visit Information  Last PT Received On: 02/08/12 Assistance Needed: +1    Subjective Data  Subjective: Do you know if I am going home today?   Cognition  Overall Cognitive Status: Impaired Area of Impairment: Attention;Memory;Safety/judgement;Awareness of errors;Awareness of deficits;Problem solving;Executive functioning Arousal/Alertness: Awake/alert Orientation Level: Disoriented to;Time;Situation Behavior During Session: WFL for tasks performed Current Attention Level: Sustained Memory: Decreased recall of precautions Memory Deficits: decreased STM Awareness of Errors: Assistance required to identify errors made;Assistance required to correct errors made Problem Solving: pt slow to process.      Balance     End of Session PT - End of Session Equipment Utilized During Treatment: Gait belt Activity Tolerance: Patient limited by fatigue Patient left: in bed;with call bell/phone within reach;with bed alarm set Nurse Communication: Mobility status   GP     Ferman Hamming 02/08/2012, 3:38 PM Weldon Picking PT Acute Rehab Services [phone] Beeper [phone]

## 2012-02-08 NOTE — Progress Notes (Signed)
   CARE MANAGEMENT NOTE 02/08/2012  Patient:  Russell [name], Russell [name]   Account Number:  [id number]  Date Initiated:  02/06/2012  Documentation initiated by:  Darlyne Russian  Subjective/Objective Assessment:   Patient admitted with weakness     Action/Plan:   Progression of care and discharge planning   Anticipated DC Date:     Anticipated DC Plan:    In-house referral  Clinical Social Worker      DC Planning Services  CM consult      Choice offered to / List presented to:             Status of service:  In process, will continue to follow Medicare Important Message given?   (If response is "NO", the following Medicare IM given date fields will be blank) Date Medicare IM given:   Date Additional Medicare IM given:    Discharge Disposition:    Per UR Regulation:  Reviewed for med. necessity/level of care/duration of stay  If discussed at Long Length of Stay Meetings, dates discussed:    Comments:  02/08/2012  [address] RN, [place]   [phone]  Russell [name] (son) daughter in [place]  phone:  [phone]  Met with son Russell [name] and daughter in law Russell [name] regarding discharge planning. The patient moved from his home to [facility] 12/10/2011, from [place]. Discussed the options of returning to ALF or SNF, based upon the recommendations of the PT/OT and his medical status. The SW will contact [facility] to determine if they can met the patient's care needs. If he cannot return to [facility] will fax to SNF. Provided list of area SNF for their review. They will talk with the patient regarding the options and what he may want to do.  CM to continue to follow.

## 2012-02-09 DIAGNOSIS — J189 Pneumonia, unspecified organism: Secondary | ICD-10-CM

## 2012-02-09 DIAGNOSIS — R222 Localized swelling, mass and lump, trunk: Secondary | ICD-10-CM

## 2012-02-09 LAB — BASIC METABOLIC PANEL
Calcium: 8.3 mg/dL — ABNORMAL LOW (ref 8.4–10.5)
Chloride: 97 mEq/L (ref 96–112)
Creatinine, Ser: 1.63 mg/dL — ABNORMAL HIGH (ref 0.50–1.35)
GFR calc Af Amer: 40 mL/min — ABNORMAL LOW (ref >90)
GFR calc non Af Amer: 35 mL/min — ABNORMAL LOW (ref >90)

## 2012-02-09 NOTE — Progress Notes (Signed)
[facility] PROGRESS NOTE  Russell [name] HYQ:[id number] DOB: Jul 05, 1916 DOA: 02/05/2012 PCP: No primary provider on file.  Assessment/Plan: #1. Generalized weakness with fatigue and hyponatremia -  Likely due to dehydration/lung involement. Slight improvement..  . Continue bmet's to follow trend. TSH 0.92.  #2. Renal failure - not sure if his acute or chronic but given the anemia patient probably could be having chronic renal failure. Creatinine trending down.  UA is unremarkable. Closely follow metabolic panel- old records from [facility] requested #3. Nausea with abdominal discomfort - presently patient has no pain. Patient's CAT scan which shows gallstones. Abdominal  US yields multiple mobile gallstones without findings of cholecystitis. Questionable common bile duct stone on recent CT not appreciated on the present ultrasound. Intrahepatic biliary duct prominence may be related to the patient's age rather than obstruction. Correlation with liver function studies recommended. Medical renal disease type changes of the kidneys which appear echogenic with decreased renal parenchyma. No hydronephrosis. Albumin 2.6.Alk Phos 36.  #4. Anemia - stable. follow CBC and further workup as outpatient.  #5. History of hypertension presently on no medications. SBP range 102-123.  #6. History of CVA as per records #7 PNA/lung mass- continue levaquin IV for now, plan to get follow up chest x ray for resolution, no CT scan for now discussed with son   Code Status: full Family Communication:  Disposition Plan: to be determinded- ? Back to [facility] with assistance vs SNF   Consultants:  none  Procedures:  none  Antibiotics:  none  HPI/Subjective: Says he is feeling better today! No CP, no SOB  Objective: Filed Vitals:   02/08/12 1408 02/08/12 2004 02/09/12 0528 02/09/12 1023  BP: 109/52 117/48 123/51 119/64  Pulse: 69 60 58 53  Temp: 97.8 F (36.6 C) 98.2 F (36.8 C) 97.2 F  (36.2 C) 97.8 F (36.6 C)  TempSrc: Oral Oral Oral Oral  Resp: 18 18 20 20   Weight:  73.301 kg (161 lb 9.6 oz)    SpO2: 96% 98% 98% 99%   No intake or output data in the 24 hours ending 02/09/12 1058 Filed Weights   02/06/12 2113 02/07/12 2101 02/08/12 2004  Weight: 71.5 kg (157 lb 10.1 oz) 71.9 kg (158 lb 8.2 oz) 73.301 kg (161 lb 9.6 oz)    Exam:   General:  Awake alert NAD  Cardiovascular: RRR No MGR No LEE PPP  Respiratory: normal effort. BSCTAB No wheeze/rhonchi  Abdomen: soft +BS non-tender.   Data Reviewed: Basic Metabolic Panel:  Lab 02/09/12 9528 02/08/12 0600 02/07/12 0520 02/06/12 1652 02/06/12 1249  NA 128* 126* 129* 129* 129*  K 3.8 4.6 4.3 4.5 4.2  CL 97 96 99 97 96  CO2 20 17* 19 21 20   GLUCOSE 96 100* 99 90 81  BUN 17 19 21 21 21   CREATININE 1.63* 1.68* 1.63* 1.80* 1.82*  CALCIUM 8.3* 8.4 8.2* 8.4 8.5  MG -- -- -- -- --  PHOS -- -- -- -- --   Liver Function Tests:  Lab 02/06/12 0611  AST 10  ALT 8  ALKPHOS 36*  BILITOT 0.2*  PROT 6.2  ALBUMIN 2.6*   No results found for this basename: LIPASE:5,AMYLASE:5 in the last 168 hours No results found for this basename: AMMONIA:5 in the last 168 hours CBC:  Lab 02/08/12 0600 02/07/12 0520 02/06/12 0611 02/05/12 1657  WBC 8.5 9.9 8.4 8.4  NEUTROABS -- -- 5.6 --  HGB 8.3* 8.5* 8.6* 9.0*  HCT 23.6* 24.6* 24.2*  25.7*  MCV 86.8 86.9 87.1 86.5  PLT 251 285 299 317   Cardiac Enzymes: No results found for this basename: CKTOTAL:5,CKMB:5,CKMBINDEX:5,TROPONINI:5 in the last 168 hours BNP (last 3 results) No results found for this basename: PROBNP:3 in the last 8760 hours CBG: No results found for this basename: GLUCAP:5 in the last 168 hours  Recent Results (from the past 240 hour(s))  CULTURE, BLOOD (ROUTINE X 2)     Status: Normal (Preliminary result)   Collection Time   02/06/12  1:30 PM      Component Value Range Status Comment   Specimen Description BLOOD RIGHT ANTECUBITAL   Final    Special  Requests BOTTLES DRAWN AEROBIC ONLY 8CC   Final    Culture  Setup Time 02/06/2012 18:13   Final    Culture     Final    Value:        BLOOD CULTURE RECEIVED NO GROWTH TO DATE CULTURE WILL BE HELD FOR 5 DAYS BEFORE ISSUING A FINAL NEGATIVE REPORT   Report Status PENDING   Incomplete   CULTURE, BLOOD (ROUTINE X 2)     Status: Normal (Preliminary result)   Collection Time   02/06/12  1:45 PM      Component Value Range Status Comment   Specimen Description BLOOD RIGHT HAND   Final    Special Requests BOTTLES DRAWN AEROBIC ONLY 6CC   Final    Culture  Setup Time 02/06/2012 18:13   Final    Culture     Final    Value:        BLOOD CULTURE RECEIVED NO GROWTH TO DATE CULTURE WILL BE HELD FOR 5 DAYS BEFORE ISSUING A FINAL NEGATIVE REPORT   Report Status PENDING   Incomplete      Studies: Dg Chest 2 View  02/07/2012  *RADIOLOGY REPORT*  Clinical Data: Pneumonia, shortness of breath.  CHEST - 2 VIEW  Comparison: 02/06/2012  Findings: Findings concerning for possible left hilar or perihilar mass.  Increasing density in the left upper lobe perihilar region. While the above findings could reflect pneumonia, I cannot exclude central obstructing mass with postobstructive process.  Recommend CT with IV contrast for further evaluation.  Mild cardiomegaly.  Bibasilar atelectasis.  No effusions or acute bony abnormality.  IMPRESSION: Findings concerning for possible left hilar/perihilar mass and postobstructive process.  Recommend chest CT with IV contrast for further evaluation.   Original Report Authenticated By: Charlett Nose, M.D.     Scheduled Meds:    . citalopram  20 mg Oral Daily  . hydrOXYzine  10 mg Oral TID  . levofloxacin (LEVAQUIN) IV  750 mg Intravenous Q48H  . pantoprazole  40 mg Oral Daily   Continuous Infusions:   Principal Problem:  *Weakness Active Problems:  Hyponatremia  Renal failure, unspecified  Anemia  Pneumonia  Lung mass    Time spent: 30 minutes    [name],  Dina Mobley  [facility]  If 8PM-8AM, please contact night-coverage at [email or web address], password [facility] 02/09/2012, 10:58 AM  LOS: 4 days

## 2012-02-10 NOTE — Progress Notes (Signed)
ANTIBIOTIC CONSULT NOTE - FOLLOW UP  Pharmacy Consult for Levaquin Indication: rule out pneumonia  Allergies  Allergen Reactions  . Ambien (Zolpidem Tartrate) Other (See Comments)    Unknown reaction  . Penicillins Other (See Comments)    Unknown reaction  Patient Measurements: Weight: 159 lb 13.3 oz (72.5 kg) Vital Signs: Temp: 97.7 F (36.5 C) (11/03 1254) Temp src: Oral (11/03 1254) BP: 118/65 mmHg (11/03 1254) Pulse Rate: 66  (11/03 1254) Labs:  Basename 02/09/12 0620 02/08/12 0600  WBC -- 8.5  HGB -- 8.3*  PLT -- 251  LABCREA -- --  CREATININE 1.63* 1.68*   Microbiology: Recent Results (from the past 720 hour(s))  CULTURE, BLOOD (ROUTINE X 2)     Status: Normal (Preliminary result)   Collection Time   02/06/12  1:30 PM      Component Value Range Status Comment   Specimen Description BLOOD RIGHT ANTECUBITAL   Final    Special Requests BOTTLES DRAWN AEROBIC ONLY 8CC   Final    Culture  Setup Time 02/06/2012 18:13   Final    Culture     Final    Value:        BLOOD CULTURE RECEIVED NO GROWTH TO DATE CULTURE WILL BE HELD FOR 5 DAYS BEFORE ISSUING A FINAL NEGATIVE REPORT   Report Status PENDING   Incomplete   CULTURE, BLOOD (ROUTINE X 2)     Status: Normal (Preliminary result)   Collection Time   02/06/12  1:45 PM      Component Value Range Status Comment   Specimen Description BLOOD RIGHT HAND   Final    Special Requests BOTTLES DRAWN AEROBIC ONLY 6CC   Final    Culture  Setup Time 02/06/2012 18:13   Final    Culture     Final    Value:        BLOOD CULTURE RECEIVED NO GROWTH TO DATE CULTURE WILL BE HELD FOR 5 DAYS BEFORE ISSUING A FINAL NEGATIVE REPORT   Report Status PENDING   Incomplete     Anti-infectives     Start     Dose/Rate Route Frequency Ordered Stop   02/07/12 1300   levofloxacin (LEVAQUIN) IVPB 750 mg        750 mg 100 mL/hr over 90 Minutes Intravenous Every 48 hours 02/07/12 1121           Assessment: 89 YOM on Levaquin day #4 empirically  for pneumonia. CXR on 10/31- mass vs pneumonia. Not yet repeated. Afebrile. WBC wnl on 11/1. SCr stable at 1.63, estimated CrCl~25-71mL/min. Blood cultures are no growth to date.   Goal of Therapy:  Clinical resolution of infection  Plan:  1. Continue Levaquin 750mg  IV q48h- Please consider conversion to oral tablet if patient can tolerate.   Fayne Norrie 02/10/2012,1:41 PM

## 2012-02-10 NOTE — Progress Notes (Signed)
Clinical Social Work Department BRIEF PSYCHOSOCIAL ASSESSMENT 02/10/2012  Patient:  Russell [name], Russell [name]     Account Number:  [id number]     Admit date:  02/05/2012  Clinical Social Worker:  Johnsie Cancel  Date/Time:  02/10/2012 12:32 PM  Referred by:  Physician  Date Referred:  02/07/2012 Referred for  ALF Placement   Other Referral:   Interview type:  Family Other interview type:    PSYCHOSOCIAL DATA Living Status:  FACILITY Admitted from facility:  [facility] Level of care:  Assisted Living Primary support name:  Russell [name] ([phone]) Primary support relationship to patient:  CHILD, ADULT Degree of support available:   Strong and vested.    CURRENT CONCERNS Current Concerns  Post-Acute Placement   Other Concerns:    SOCIAL WORK ASSESSMENT / PLAN CSW consulted by physician re: patient being from facility. CSW contacted the patient's son, due to his involvement in his care. Son, wants the patient to return to ALF [facility]) if he is medically able. Son is open to a SNF search if ALF is unable to [facility] medical needs. Son requests a SNF no further from him than [place], son lives in [place] and would like something closer to him. CSW sent medical information to ALF and weekday CSW will follow up Monday.   Assessment/plan status:  Information/Referral to [facility] Other assessment/ plan:   Information/referral to community resources:    PATIENT'S/FAMILY'S RESPONSE TO PLAN OF CARE: Patient's son thanked CSW for facilitating d/c plan.   Lia Foyer, LCSWA [facility] Clinical Social Worker Contact #: [phone] (weekend)

## 2012-02-10 NOTE — Progress Notes (Signed)
[facility] PROGRESS NOTE  Russell [name] XWR:[id number] DOB: 04/10/1916 DOA: 02/05/2012 PCP: No primary provider on file.  Assessment/Plan: #1. Generalized weakness with fatigue and hyponatremia -  Likely due to dehydration/lung involement. Slight improvement..  . Continue bmet's to follow trend. TSH 0.92.  #2. Renal failure - not sure if his acute or chronic but given the anemia patient probably could be having chronic renal failure. Creatinine trending down.  UA is unremarkable. Closely follow metabolic panel- old records from [facility] requested #3. Nausea with abdominal discomfort - presently patient has no pain or nausea. Patient's CAT scan which shows gallstones. Abdominal  US yields multiple mobile gallstones without findings of cholecystitis. Questionable common bile duct stone on recent CT not appreciated on the present ultrasound. Intrahepatic biliary duct prominence may be related to the patient's age rather than obstruction. Correlation with liver function studies recommended. Medical renal disease type changes of the kidneys which appear echogenic with decreased renal parenchyma. No hydronephrosis. Albumin 2.6.Alk Phos 36.  #4. Anemia - stable. follow CBC and further workup as outpatient.  #5. History of hypertension presently on no medications. SBP range 102-123.  #6. History of CVA as per records #7 PNA/lung mass- continue levaquin IV for now, plan to get follow up chest x ray for resolution, no CT scan for now discussed with son #8- weakness- asked x 3 days for patient to be placed in chair for meals but has not been done   Code Status: full Family Communication:  Disposition Plan: to be determinded- ? Back to [facility] with assistance vs SNF- would appreciate social work/case manager involvement- consulted last week   Consultants:  none  Procedures:  none  Antibiotics:  none  HPI/Subjective: Wants to go back to [facility]  Objective: Filed  Vitals:   02/09/12 1458 02/09/12 1802 02/09/12 2045 02/10/12 0602  BP: 139/64 131/69 109/52 107/63  Pulse: 59 57 58 70  Temp: 98.3 F (36.8 C) 98.1 F (36.7 C) 97.1 F (36.2 C) 97.9 F (36.6 C)  TempSrc: Oral Oral Oral Oral  Resp: 18 18 18 18   Weight:   72.5 kg (159 lb 13.3 oz)   SpO2: 98% 98% 95% 98%   No intake or output data in the 24 hours ending 02/10/12 0850 Filed Weights   02/07/12 2101 02/08/12 2004 02/09/12 2045  Weight: 71.9 kg (158 lb 8.2 oz) 73.301 kg (161 lb 9.6 oz) 72.5 kg (159 lb 13.3 oz)    Exam:   General:  Awake alert NAD  Cardiovascular: RRR No MGR No LEE PPP  Respiratory: normal effort. BSCTAB No wheeze/rhonchi  Abdomen: soft +BS non-tender.   Data Reviewed: Basic Metabolic Panel:  Lab 02/09/12 1914 02/08/12 0600 02/07/12 0520 02/06/12 1652 02/06/12 1249  NA 128* 126* 129* 129* 129*  K 3.8 4.6 4.3 4.5 4.2  CL 97 96 99 97 96  CO2 20 17* 19 21 20   GLUCOSE 96 100* 99 90 81  BUN 17 19 21 21 21   CREATININE 1.63* 1.68* 1.63* 1.80* 1.82*  CALCIUM 8.3* 8.4 8.2* 8.4 8.5  MG -- -- -- -- --  PHOS -- -- -- -- --   Liver Function Tests:  Lab 02/06/12 0611  AST 10  ALT 8  ALKPHOS 36*  BILITOT 0.2*  PROT 6.2  ALBUMIN 2.6*   No results found for this basename: LIPASE:5,AMYLASE:5 in the last 168 hours No results found for this basename: AMMONIA:5 in the last 168 hours CBC:  Lab 02/08/12 0600 02/07/12  1610 02/06/12 0611 02/05/12 1657  WBC 8.5 9.9 8.4 8.4  NEUTROABS -- -- 5.6 --  HGB 8.3* 8.5* 8.6* 9.0*  HCT 23.6* 24.6* 24.2* 25.7*  MCV 86.8 86.9 87.1 86.5  PLT 251 285 299 317   Cardiac Enzymes: No results found for this basename: CKTOTAL:5,CKMB:5,CKMBINDEX:5,TROPONINI:5 in the last 168 hours BNP (last 3 results) No results found for this basename: PROBNP:3 in the last 8760 hours CBG: No results found for this basename: GLUCAP:5 in the last 168 hours  Recent Results (from the past 240 hour(s))  CULTURE, BLOOD (ROUTINE X 2)     Status:  Normal (Preliminary result)   Collection Time   02/06/12  1:30 PM      Component Value Range Status Comment   Specimen Description BLOOD RIGHT ANTECUBITAL   Final    Special Requests BOTTLES DRAWN AEROBIC ONLY 8CC   Final    Culture  Setup Time 02/06/2012 18:13   Final    Culture     Final    Value:        BLOOD CULTURE RECEIVED NO GROWTH TO DATE CULTURE WILL BE HELD FOR 5 DAYS BEFORE ISSUING A FINAL NEGATIVE REPORT   Report Status PENDING   Incomplete   CULTURE, BLOOD (ROUTINE X 2)     Status: Normal (Preliminary result)   Collection Time   02/06/12  1:45 PM      Component Value Range Status Comment   Specimen Description BLOOD RIGHT HAND   Final    Special Requests BOTTLES DRAWN AEROBIC ONLY 6CC   Final    Culture  Setup Time 02/06/2012 18:13   Final    Culture     Final    Value:        BLOOD CULTURE RECEIVED NO GROWTH TO DATE CULTURE WILL BE HELD FOR 5 DAYS BEFORE ISSUING A FINAL NEGATIVE REPORT   Report Status PENDING   Incomplete      Studies: No results found.  Scheduled Meds:    . citalopram  20 mg Oral Daily  . hydrOXYzine  10 mg Oral TID  . levofloxacin (LEVAQUIN) IV  750 mg Intravenous Q48H  . pantoprazole  40 mg Oral Daily   Continuous Infusions:   Principal Problem:  *Weakness Active Problems:  Hyponatremia  Renal failure, unspecified  Anemia  Pneumonia  Lung mass    Time spent: 30 minutes    Russell [name]  [facility]  If 8PM-8AM, please contact night-coverage at [email or web address], password [facility] 02/10/2012, 8:50 AM  LOS: 5 days

## 2012-02-11 MED ORDER — POLYETHYLENE GLYCOL 3350 17 G PO PACK: 17.0000 g | PACK | Freq: Every day | ORAL | Status: DC | PRN

## 2012-02-11 MED ORDER — ALBUTEROL SULFATE (5 MG/ML) 0.5% IN NEBU: 2.5000 mg | INHALATION_SOLUTION | RESPIRATORY_TRACT | Status: DC | PRN

## 2012-02-11 MED ORDER — TRAMADOL HCL 50 MG PO TABS
50.0000 mg | ORAL_TABLET | Freq: Every day | ORAL | Status: DC
Start: 2012-02-11 — End: 2012-03-18

## 2012-02-11 MED ORDER — DIAZEPAM 5 MG PO TABS: 5.0000 mg | ORAL_TABLET | Freq: Two times a day (BID) | ORAL | Status: DC | PRN

## 2012-02-11 MED ORDER — GUAIFENESIN 100 MG/5ML PO SYRP
200.0000 mg | ORAL_SOLUTION | ORAL | Status: DC | PRN
  Filled 2012-02-11: qty 118

## 2012-02-11 MED ORDER — GUAIFENESIN 100 MG/5ML PO SYRP
200.0000 mg | ORAL_SOLUTION | ORAL | Status: DC | PRN
  Administered 2012-02-11: 200 mg via ORAL
  Filled 2012-02-11 (×2): qty 118

## 2012-02-11 MED ORDER — LEVOFLOXACIN 750 MG PO TABS: 750.0000 mg | ORAL_TABLET | ORAL | Status: DC

## 2012-02-11 MED ORDER — POLYETHYLENE GLYCOL 3350 17 G PO PACK
17.0000 g | PACK | Freq: Every day | ORAL | Status: DC | PRN
Start: 2012-02-11 — End: 2012-02-11
  Filled 2012-02-11: qty 1

## 2012-02-11 MED ORDER — GUAIFENESIN ER 600 MG PO TB12: 600.0000 mg | ORAL_TABLET | Freq: Two times a day (BID) | ORAL | Status: DC | PRN

## 2012-02-11 MED ORDER — LEVOFLOXACIN 750 MG PO TABS
750.0000 mg | ORAL_TABLET | ORAL | Status: DC
  Filled 2012-02-11: qty 1

## 2012-02-11 MED ORDER — GUAIFENESIN ER 600 MG PO TB12
600.0000 mg | ORAL_TABLET | Freq: Two times a day (BID) | ORAL | Status: DC | PRN
  Filled 2012-02-11: qty 1

## 2012-02-11 NOTE — Clinical Social Work Note (Signed)
Patient is medically stable for discharge back to [facility] today. Discharge information forwarded to facility and approved. Patient will be transported to facility via ambulance.  Genelle Bal, MSW, LCSW [phone]

## 2012-02-11 NOTE — Discharge Summary (Addendum)
Physician Discharge Summary  Russell [name] ZOX:[id number] DOB: [birth year] DOA: 02/05/2012  PCP: No primary provider on file.  Admit date: 02/05/2012 Discharge date: 02/11/2012  Time spent: 45 minutes  Recommendations for Outpatient Follow-up:  1. Being discharged to assisted living.. Recommend repeat chest xray in 1 month to ensure resolution of pneumonia and may consider chest CT depending on patient/family wishes  Discharge Diagnoses:  Principal Problem:  *Weakness Active Problems:  Hyponatremia  Renal failure, unspecified  Anemia  Pneumonia  Lung mass   Discharge Condition: Medically stable and ready for discharge to assisted living  Diet recommendation: honey thick no added salt  Filed Weights   02/08/12 2004 02/09/12 2045 02/10/12 2141  Weight: 73.301 kg (161 lb 9.6 oz) 72.5 kg (159 lb 13.3 oz) 72.9 kg (160 lb 11.5 oz)    History of present illness:   Russell [name] is a very pleasant 78 year-old male with previous history of hypertension and CVA was brought to the ER on 02/06/12 from his assisted living facility because of weakness and confusion. Patient also had complained of some headache and nausea. Patient had been feeling weak for previous 2 days. In the ER patient was initially found to be mildly confused. He also was complaining of some left upper quadrant pain and also stated that he has some nausea. His labs showed hyponatremia with increased creatinine. We did not  have a baseline creatinine of the patient. Patient also had a CT abdomen pelvis which only showed gallstones otherwise unremarkable. Pt admitted to medical service for further eval and treatment.    Hospital Course:  1. Generalized weakness with fatigue and hyponatremia - Likely due to dehydration/lung involement. Pt admitted to medical floor. Given IV fluids. Sodium trended upward. At discharge sodium stable at 129. TSH 0.92. Recommend bmet in 1 week to trend sodium. #2. Renal failure - not sure if his  acute or chronic but given the anemia patient probably could be having chronic renal failure. Creatinine slowly trended down. UA  Unremarkable. Abdominal CT as above. At discharge creatinine 1.63 down from 1.87 on admission. Recommend bmet in 1 week. #3. Nausea with abdominal discomfort - patient had no abdominal  Pain but did have nausea. Patient's CAT scan which shows gallstones. Abdominal US yields multiple mobile gallstones without findings of cholecystitis. Questionable common bile duct stone on recent CT not appreciated on the present ultrasound. Intrahepatic biliary duct prominence may be related to the patient's age rather than obstruction.  Medical renal disease type changes of the kidneys which appear echogenic with decreased renal parenchyma. No hydronephrosis. Albumin 2.6.Alk Phos 36. At discharge no nausea or vomiting. Tolerating diet well #4. Anemia - stable. follow CBC and further workup as outpatient.  #5. History of hypertension presently on no medications. SBP range 102-123.  #6. History of CVA as per records  #7 PNA/lung mass- blood cultures neg, started on levaquin IV .  Follow up chest xray showed lung mass.  no CT scan for now discussed with son. Stop abx on  Saturday 11/9 #8- weakness-  Improved. Seen by PT during this hospitalization. They recommend 24 hour supervision level care.     Discharge Exam: Filed Vitals:   02/10/12 2141 02/11/12 0506 02/11/12 0753 02/11/12 1319  BP: 107/81 128/61 129/67 111/68  Pulse: 66 62 79 55  Temp: 98 F (36.7 C) 98.5 F (36.9 C) 97.7 F (36.5 C) 98.7 F (37.1 C)  TempSrc: Oral Oral Oral Oral  Resp: 18 18 18 16   Weight:  72.9 kg (160 lb 11.5 oz)     SpO2: 100% 94% 99% 94%    General: awake alert oriented  Cardiovascular: RRR No MGR No LEE Respiratory: Normal effort BS clear but diminished  Discharge Instructions follow up chest xray in 1 month. Discuss with family/pt desire for follow up chest CT      Discharge Orders    Future  Orders Please Complete By Expires   Increase activity slowly      Discharge instructions      Comments:   Repeat chest x ray to ensure resolution of PNA BMP       Medication List     As of 02/11/2012  4:11 PM    STOP taking these medications         hydrochlorothiazide 12.5 MG capsule   Commonly known as: MICROZIDE      lisinopril 10 MG tablet   Commonly known as: PRINIVIL,ZESTRIL      TAKE these medications         acetaminophen 325 MG tablet   Commonly known as: TYLENOL   Take 650 mg by mouth every 6 (six) hours as needed. For pain.      bisacodyl 5 MG EC tablet   Commonly known as: DULCOLAX   Take 10 mg by mouth daily.      cholecalciferol 1000 UNITS tablet   Commonly known as: VITAMIN D   Take 2,000 Units by mouth daily.      citalopram 20 MG tablet   Commonly known as: CELEXA   Take 20 mg by mouth daily.      diazepam 5 MG tablet   Commonly known as: VALIUM   Take 1 tablet (5 mg total) by mouth 2 (two) times daily as needed. Take 5mg  daily as needed for anxiety and take 5mg  every night at bedtime.      docusate sodium 100 MG capsule   Commonly known as: COLACE   Take 100 mg by mouth 2 (two) times daily.      finasteride 5 MG tablet   Commonly known as: PROSCAR   Take 5 mg by mouth at bedtime.      guaiFENesin 600 MG 12 hr tablet   Commonly known as: MUCINEX   Take 1 tablet (600 mg total) by mouth 2 (two) times daily as needed.      hydrOXYzine 10 MG tablet   Commonly known as: ATARAX/VISTARIL   Take 10 mg by mouth 3 (three) times daily.      levofloxacin 750 MG tablet   Commonly known as: LEVAQUIN   Take 1 tablet (750 mg total) by mouth every other day.      omeprazole 40 MG capsule   Commonly known as: PRILOSEC   Take 40 mg by mouth daily before breakfast.      polyethylene glycol packet   Commonly known as: MIRALAX / GLYCOLAX   Take 17 g by mouth daily as needed.      ranitidine 300 MG tablet   Commonly known as: ZANTAC   Take 300 mg by  mouth at bedtime.      sorbitol 70 % solution   Take 5 mLs by mouth daily.      traMADol 50 MG tablet   Commonly known as: ULTRAM   Take 1 tablet (50 mg total) by mouth daily.           The results of significant diagnostics from this hospitalization (including imaging, microbiology, ancillary and laboratory) are listed  below for reference.    Significant Diagnostic Studies: Ct Abdomen Pelvis Wo Contrast  02/06/2012  *RADIOLOGY REPORT*  Clinical Data: Urinary tract infection.  Fever increased confusion. Left lower quadrant pain.  Diarrhea and nausea.  CT ABDOMEN AND PELVIS WITHOUT CONTRAST  Technique:  Multidetector CT imaging of the abdomen and pelvis was performed following the standard protocol without intravenous contrast.  Comparison: None.  Findings: Mild dependent atelectasis is present in the right lung base.  There is some scarring laterally in the left lower lobe.  The heart size is normal.  Coronary artery calcifications are evident.  A 10 mm hypodense lesion in the left lobe liver is likely benign. The liver is otherwise unremarkable.  The spleen is within normal limits.  The stomach, duodenum, and pancreas are within normal limits.  The common bile duct is normal.  Layering stones are present in the gallbladder without evidence for inflammation.  The adrenal glands are normal bilaterally.  There is mild stranding about both kidneys without focal inflammation or hydronephrosis. Ureters are within normal limits to the urinary bladder bilaterally.  The urinary bladder is unremarkable.  The rectosigmoid colon is within normal limits.  The remainder of the colon is unremarkable.  The appendix is visualized and within normal limits.  The small bowel is unremarkable.  No significant adenopathy or free fluid is present.  There is some fat herniating into the inguinal canals bilaterally without associated bowel.  Bone windows demonstrate moderate endplate degenerative changes at L3-4, L4-5,  and L5-S1.  Slight leftward curvature of the lumbar spine is present.  IMPRESSION:  1.  No acute or focal abnormality to explain the patient's symptoms. 2.  Atherosclerosis. 3.  Cholelithiasis without evidence for cholecystitis. 4.  Atelectasis and scarring at the lung bases. 5.  Spondylosis of the lower lumbar spine. 6.  10 mm hypodense lesion in the left lobe of liver likely represents a simple cyst.   Original Report Authenticated By: Jamesetta Orleans. MATTERN, M.D.    Dg Chest 2 View  02/07/2012  *RADIOLOGY REPORT*  Clinical Data: Pneumonia, shortness of breath.  CHEST - 2 VIEW  Comparison: 02/06/2012  Findings: Findings concerning for possible left hilar or perihilar mass.  Increasing density in the left upper lobe perihilar region. While the above findings could reflect pneumonia, I cannot exclude central obstructing mass with postobstructive process.  Recommend CT with IV contrast for further evaluation.  Mild cardiomegaly.  Bibasilar atelectasis.  No effusions or acute bony abnormality.  IMPRESSION: Findings concerning for possible left hilar/perihilar mass and postobstructive process.  Recommend chest CT with IV contrast for further evaluation.   Original Report Authenticated By: Charlett Nose, M.D.    US Abdomen Complete  02/06/2012  *RADIOLOGY REPORT*  Clinical Data:  .  Abdominal pain.  Renal failure.  COMPLETE ABDOMINAL ULTRASOUND  Comparison:  02/06/2012.  Findings:  Gallbladder:  Multiple tiny mobile gallstones.  Gallbladder wall does not appear thickened.  No pericholecystic fluid.  The patient was not tender over this region during scanning.  Common bile duct:  5.2 mm.  In retrospect on the CT scan there may be a tiny common bile duct stone not appreciated on the present exam.  Liver:  Left lobe liver 1.3 cm cyst.  Intrahepatic bile duct prominence may be related to the patient's age rather than obstruction.  Correlation with laboratory studies recommended.  IVC:  Not visualized secondary to  bowel gas.  Pancreas:  Not visualized secondary to bowel gas.  Spleen:  3.7  cm without focal mass.  Right Kidney:  10.0 cm.  Echogenic consistent with medical renal disease type changes. No hydronephrosis or renal mass.  Left Kidney:  9.1 cm.  Echogenic consistent with medical renal disease type changes. No hydronephrosis or renal mass.  Abdominal aorta:  Portions not well delineated secondary to bowel gas.  Atherosclerotic type changes with mild ectasia measuring up to 2.3 cm.  IMPRESSION: Multiple mobile gallstones without findings of cholecystitis.  Questionable common bile duct stone on recent CT not appreciated on the present ultrasound.  Intrahepatic biliary duct prominence may be related to the patient's age rather than obstruction.  Correlation with liver function studies recommended.  Medical renal disease type changes of the kidneys which appear echogenic with decreased renal parenchyma.  No hydronephrosis.  Secondary to bowel gas, poor delineation of the inferior vena cava, pancreas and portions of the abdominal aorta appear   Original Report Authenticated By: Fuller Canada, M.D.    Dg Chest Port 1 View  02/06/2012  *RADIOLOGY REPORT*  Clinical Data: Infiltrates.  Shortness of breath.  PORTABLE CHEST - 1 VIEW  Comparison: None.  Findings: The heart size is exaggerated by low lung volumes. Moderate pulmonary vascular congestion is evident.  There is some tortuosity of the aorta.  Mild dependent atelectasis is present bilaterally.  IMPRESSION:  1.  Low lung volumes and moderate to pulmonary vascular congestion. 2.  Mild bibasilar atelectasis.   Original Report Authenticated By: Jamesetta Orleans. MATTERN, M.D.     Microbiology: Recent Results (from the past 240 hour(s))  CULTURE, BLOOD (ROUTINE X 2)     Status: Normal (Preliminary result)   Collection Time   02/06/12  1:30 PM      Component Value Range Status Comment   Specimen Description BLOOD RIGHT ANTECUBITAL   Final    Special Requests  BOTTLES DRAWN AEROBIC ONLY 8CC   Final    Culture  Setup Time 02/06/2012 18:13   Final    Culture     Final    Value:        BLOOD CULTURE RECEIVED NO GROWTH TO DATE CULTURE WILL BE HELD FOR 5 DAYS BEFORE ISSUING A FINAL NEGATIVE REPORT   Report Status PENDING   Incomplete   CULTURE, BLOOD (ROUTINE X 2)     Status: Normal (Preliminary result)   Collection Time   02/06/12  1:45 PM      Component Value Range Status Comment   Specimen Description BLOOD RIGHT HAND   Final    Special Requests BOTTLES DRAWN AEROBIC ONLY 6CC   Final    Culture  Setup Time 02/06/2012 18:13   Final    Culture     Final    Value:        BLOOD CULTURE RECEIVED NO GROWTH TO DATE CULTURE WILL BE HELD FOR 5 DAYS BEFORE ISSUING A FINAL NEGATIVE REPORT   Report Status PENDING   Incomplete      Labs: Basic Metabolic Panel:  Lab 02/09/12 4098 02/08/12 0600 02/07/12 0520 02/06/12 1652 02/06/12 1249  NA 128* 126* 129* 129* 129*  K 3.8 4.6 4.3 4.5 4.2  CL 97 96 99 97 96  CO2 20 17* 19 21 20   GLUCOSE 96 100* 99 90 81  BUN 17 19 21 21 21   CREATININE 1.63* 1.68* 1.63* 1.80* 1.82*  CALCIUM 8.3* 8.4 8.2* 8.4 8.5  MG -- -- -- -- --  PHOS -- -- -- -- --   Liver Function Tests:  Lab  02/06/12 0611  AST 10  ALT 8  ALKPHOS 36*  BILITOT 0.2*  PROT 6.2  ALBUMIN 2.6*   No results found for this basename: LIPASE:5,AMYLASE:5 in the last 168 hours No results found for this basename: AMMONIA:5 in the last 168 hours CBC:  Lab 02/08/12 0600 02/07/12 0520 02/06/12 0611 02/05/12 1657  WBC 8.5 9.9 8.4 8.4  NEUTROABS -- -- 5.6 --  HGB 8.3* 8.5* 8.6* 9.0*  HCT 23.6* 24.6* 24.2* 25.7*  MCV 86.8 86.9 87.1 86.5  PLT 251 285 299 317   Cardiac Enzymes: No results found for this basename: CKTOTAL:5,CKMB:5,CKMBINDEX:5,TROPONINI:5 in the last 168 hours BNP: BNP (last 3 results) No results found for this basename: PROBNP:3 in the last 8760 hours CBG: No results found for this basename: GLUCAP:5 in the last 168  hours     Signed:  Marlin Canary [facility] 02/11/2012, 4:11 PM

## 2012-02-11 NOTE — Progress Notes (Signed)
Physical Therapy Treatment Patient Details Name: Russell [name] MRN: [id number] DOB: Feb 15, 1917 Today's Date: 02/11/2012 Time: 2130-8657 PT Time Calculation (min): 32 min  PT Assessment / Plan / Recommendation Comments on Treatment Session  Pt with less cognitive issues this session, though is very much desiring to d/c back to [facility] today. He fatigues very quickly and had a "croupy" sounding cough that occurred with physical exertion.  He c/o his nose being stuffy and feeling weak from being in the bed.  O2 sats 95% on RA and HR 110 right after ambulation.  Pt is at a supervision level and should be OK to d/c back to [facility] with HHPT. PT will continue to follow.    Follow Up Recommendations  Home health PT;Supervision/Assistance - 24 hour;Other (comment)     Does the patient have the potential to tolerate intense rehabilitation     Barriers to Discharge        Equipment Recommendations  None recommended by PT    Recommendations for Other Services    Frequency Min 3X/week   Plan Discharge plan remains appropriate;Frequency remains appropriate    Precautions / Restrictions Precautions Precautions: Fall Restrictions Weight Bearing Restrictions: No   Pertinent Vitals/Pain C/o bilateral knee discomfort with ambulation, 2/4 DOE, O2 sats 95% on RA and HR 110 after ambulation, after 3 min rest O2 sats 97%, HR 76    Mobility  Bed Mobility Bed Mobility: Supine to Sit;Sit to Supine Supine to Sit: 7: Independent;HOB flat Sitting - Scoot to Edge of Bed: 7: Independent Sit to Supine: 7: Independent Transfers Transfers: Sit to Stand;Stand to Sit Sit to Stand: 5: Supervision;With upper extremity assist;From bed Stand to Sit: 5: Supervision;To bed;With upper extremity assist Details for Transfer Assistance: performed 3+ times for dressing before staying up for ambulation.  Pt able to rise with safety but educated him on making the choice to sit for most of dressing due  to poor balance instead of trying to stand to put pants on, etc. Ambulation/Gait Ambulation/Gait Assistance: 4: Min guard Ambulation Distance (Feet): 60 Feet Assistive device: Rolling walker Ambulation/Gait Assistance Details: cues for upright posture.  Pt terminated ambulatoin due to bilateral knee discomfort and fatigue which he says is because he's been "stuck here in this bed for a week and I'm old".  Gait Pattern: Step-through pattern;Decreased stride length;Trunk flexed Gait velocity: decreased Stairs: No Wheelchair Mobility Wheelchair Mobility: No    Exercises General Exercises - Lower Extremity Quad Sets: AROM;Both;10 reps;Seated Long Arc Quad: AROM;Both;10 reps;Seated (with slow, controlled return to floor) Hip Flexion/Marching: Seated;15 reps;AROM;Both   PT Diagnosis:    PT Problem List:   PT Treatment Interventions:     PT Goals Acute Rehab PT Goals PT Goal Formulation: With patient Time For Goal Achievement: 02/21/12 Potential to Achieve Goals: Good Pt will go Supine/Side to Sit: with modified independence PT Goal: Supine/Side to Sit - Progress: Met Pt will go Sit to Supine/Side: with modified independence PT Goal: Sit to Supine/Side - Progress: Met Pt will go Sit to Stand: with modified independence PT Goal: Sit to Stand - Progress: Progressing toward goal Pt will go Stand to Sit: with modified independence PT Goal: Stand to Sit - Progress: Progressing toward goal Pt will Ambulate: >150 feet;with modified independence;with rolling walker PT Goal: Ambulate - Progress: Progressing toward goal  Visit Information  Last PT Received On: 02/11/12 Assistance Needed: +1    Subjective Data  Subjective: I am leaving today Patient Stated Goal: Back to [facility]  [place]   Cognition  Overall Cognitive Status: Impaired Area of Impairment: Attention;Memory;Safety/judgement Arousal/Alertness: Awake/alert Orientation Level: Disoriented to;Time Behavior During Session: WFL  for tasks performed Current Attention Level: Selective Memory Deficits: decreased STM Safety/Judgement: Impulsive;Decreased awareness of need for assistance Safety/Judgement - Other Comments: able to state that he needed to return to room due to his knees feeling stiff and making ambulation difficult but does not do well with perceiving difficulties ahead of time    Balance  Balance Balance Assessed: Yes Dynamic Sitting Balance Dynamic Sitting - Balance Support: Feet supported;No upper extremity supported;During functional activity Dynamic Sitting - Level of Assistance: 6: Modified independent (Device/Increase time) Dynamic Standing Balance Dynamic Standing - Balance Support: No upper extremity supported;During functional activity Dynamic Standing - Level of Assistance: 5: Stand by assistance  End of Session PT - End of Session Equipment Utilized During Treatment: Gait belt Activity Tolerance: Patient limited by fatigue Patient left: in bed;with call bell/phone within reach Nurse Communication: Mobility status   GP   Russell [name], PT  Acute Rehab Services  [phone]   Russell [name] 02/11/2012, 12:09 PM

## 2012-02-12 LAB — CULTURE, BLOOD (ROUTINE X 2): Culture: NO GROWTH

## 2012-03-18 ENCOUNTER — Emergency Department (HOSPITAL_COMMUNITY)
Admission: EM | Admit: 2012-03-18 | Discharge: 2012-03-18 | Disposition: A | Discharge status: Home / Self Care | Payer: Medicare Other | Attending: Emergency Medicine | Admitting: Emergency Medicine

## 2012-03-18 ENCOUNTER — Emergency Department (HOSPITAL_COMMUNITY): Payer: Medicare Other

## 2012-03-18 DIAGNOSIS — S0100XA Unspecified open wound of scalp, initial encounter: Secondary | ICD-10-CM | POA: Insufficient documentation

## 2012-03-18 DIAGNOSIS — S0101XA Laceration without foreign body of scalp, initial encounter: Secondary | ICD-10-CM

## 2012-03-18 DIAGNOSIS — Y939 Activity, unspecified: Secondary | ICD-10-CM | POA: Insufficient documentation

## 2012-03-18 DIAGNOSIS — W010XXA Fall on same level from slipping, tripping and stumbling without subsequent striking against object, initial encounter: Secondary | ICD-10-CM | POA: Insufficient documentation

## 2012-03-18 DIAGNOSIS — Y929 Unspecified place or not applicable: Secondary | ICD-10-CM | POA: Insufficient documentation

## 2012-03-18 DIAGNOSIS — Z8673 Personal history of transient ischemic attack (TIA), and cerebral infarction without residual deficits: Secondary | ICD-10-CM | POA: Insufficient documentation

## 2012-03-18 DIAGNOSIS — I10 Essential (primary) hypertension: Secondary | ICD-10-CM | POA: Insufficient documentation

## 2012-03-18 DIAGNOSIS — Z9889 Other specified postprocedural states: Secondary | ICD-10-CM | POA: Insufficient documentation

## 2012-03-18 DIAGNOSIS — Z8709 Personal history of other diseases of the respiratory system: Secondary | ICD-10-CM | POA: Insufficient documentation

## 2012-03-18 DIAGNOSIS — Z79899 Other long term (current) drug therapy: Secondary | ICD-10-CM | POA: Insufficient documentation

## 2012-03-18 DIAGNOSIS — Z880 Allergy status to penicillin: Secondary | ICD-10-CM | POA: Insufficient documentation

## 2012-03-18 DIAGNOSIS — Z888 Allergy status to other drugs, medicaments and biological substances status: Secondary | ICD-10-CM | POA: Insufficient documentation

## 2012-03-18 DIAGNOSIS — W19XXXA Unspecified fall, initial encounter: Secondary | ICD-10-CM

## 2012-03-18 LAB — CBC WITH DIFFERENTIAL/PLATELET
Basophils Absolute: 0 10*3/uL (ref 0.0–0.1)
HCT: 28 % — ABNORMAL LOW (ref 39.0–52.0)
Lymphocytes Relative: 23 % (ref 12–46)
Neutro Abs: 2.1 10*3/uL (ref 1.7–7.7)
Neutrophils Relative %: 60 % (ref 43–77)
Platelets: 265 10*3/uL (ref 150–400)
RDW: 15.1 % (ref 11.5–15.5)
WBC: 3.5 10*3/uL — ABNORMAL LOW (ref 4.0–10.5)

## 2012-03-18 LAB — BASIC METABOLIC PANEL
CO2: 18 mEq/L — ABNORMAL LOW (ref 19–32)
Chloride: 96 mEq/L (ref 96–112)
GFR calc Af Amer: 33 mL/min — ABNORMAL LOW (ref >90)
Potassium: 3.7 mEq/L (ref 3.5–5.1)
Sodium: 129 mEq/L — ABNORMAL LOW (ref 135–145)

## 2012-03-18 LAB — POCT I-STAT TROPONIN I: Troponin i, poc: 0.01 ng/mL (ref 0.00–0.08)

## 2012-03-18 NOTE — ED Notes (Signed)
Per ems pt fell and hit his head

## 2012-03-18 NOTE — ED Notes (Signed)
Pt is currently taking antibiotics for cellulitis of the right elbow due to a spider bite. Dressing was changed this AM

## 2012-03-18 NOTE — ED Provider Notes (Signed)
History     CSN: [id number]  Arrival date & time 03/18/12  1114   First MD Initiated Contact with Patient 03/18/12 1206      Chief Complaint  Patient presents with  . Fall    (Consider location/radiation/quality/duration/timing/severity/associated sxs/prior treatment) Patient is a 78 y.o. male presenting with fall. The history is provided by the patient.  Fall The accident occurred 1 to 2 hours ago. Pertinent negatives include no fever and no headaches.   patient lost his balance and fell backwards, hitting his head. No loss of consciousness. Patient states people were immediately all around him. No other complaints. No headache. No numbness weakness. No neck pain. Is not on blood thinners. No chest pain. No abdominal pain. His right elbow distress from a "spider bite".  Past Medical History  Diagnosis Date  . Stroke   . Acute bronchitis   . Hypertension     Past Surgical History  Procedure Date  . Hernia repair   . Lumbar disc surgery     Family History  Problem Relation Age of Onset  . CVA Mother     History  Substance Use Topics  . Smoking status: Never Smoker   . Smokeless tobacco: Not on file  . Alcohol Use: No      Review of Systems  Constitutional: Negative for fever and fatigue.  Cardiovascular: Negative for chest pain.  Gastrointestinal: Negative for constipation.  Musculoskeletal: Negative for back pain and joint swelling.  Skin: Positive for wound.  Neurological: Negative for tremors, syncope and headaches.    Allergies  Ambien and Penicillins  Home Medications   Current Outpatient Rx  Name  Route  Sig  Dispense  Refill  . ACETAMINOPHEN 325 MG PO TABS   Oral   Take 650 mg by mouth every 6 (six) hours as needed. For pain.         Marland Kitchen BISACODYL 5 MG PO TBEC   Oral   Take 10 mg by mouth daily.         Marland Kitchen VITAMIN D3 1000 UNITS PO TABS   Oral   Take 2,000 Units by mouth daily.         Marland Kitchen CITALOPRAM HYDROBROMIDE 20 MG PO TABS    Oral   Take 20 mg by mouth daily.         Marland Kitchen DIAZEPAM 5 MG PO TABS   Oral   Take 5 mg by mouth 2 (two) times daily as needed. Take 5mg  daily as needed for anxiety and take 5mg  every night at bedtime.         Marland Kitchen DOCUSATE SODIUM 100 MG PO CAPS   Oral   Take 100 mg by mouth 2 (two) times daily.         Marland Kitchen FINASTERIDE 5 MG PO TABS   Oral   Take 5 mg by mouth at bedtime.         . GUAIFENESIN ER 600 MG PO TB12   Oral   Take 1 tablet (600 mg total) by mouth 2 (two) times daily as needed.   30 tablet   0   . HYDROXYZINE HCL 10 MG PO TABS   Oral   Take 10 mg by mouth 3 (three) times daily.         Marland Kitchen OMEPRAZOLE 40 MG PO CPDR   Oral   Take 40 mg by mouth daily before breakfast.         . POLYETHYLENE GLYCOL 3350 PO PACK  Oral   Take 17 g by mouth daily as needed.   14 each   0   . RANITIDINE HCL 300 MG PO TABS   Oral   Take 300 mg by mouth at bedtime.         . SORBITOL 70 % PO SOLN   Oral   Take 5 mLs by mouth daily.         Marland Kitchen TAMSULOSIN HCL 0.4 MG PO CAPS   Oral   Take 0.4 mg by mouth daily after supper.           BP 140/70  Pulse 64  Temp 97.8 F (36.6 C) (Oral)  Resp 18  SpO2 95%  Physical Exam  Constitutional: He appears well-developed and well-nourished.  HENT:       3 cm laceration to left occipital area.  Eyes: Pupils are equal, round, and reactive to light.  Neck: Normal range of motion. Neck supple.  Cardiovascular: Normal rate.   Pulmonary/Chest: Effort normal.  Abdominal: Soft. There is no tenderness.  Musculoskeletal: Normal range of motion.       Dressing to right elbow. Nontender. Range of motion intact.    ED Course  Procedures (including critical care time)  Labs Reviewed  CBC WITH DIFFERENTIAL - Abnormal; Notable for the following:    WBC 3.5 (*)     RBC 3.29 (*)     Hemoglobin 9.9 (*)     HCT 28.0 (*)     Monocytes Relative 15 (*)     All other components within normal limits  BASIC METABOLIC PANEL - Abnormal;  Notable for the following:    Sodium 129 (*)     CO2 18 (*)     Glucose, Bld 116 (*)     Creatinine, Ser 1.92 (*)     GFR calc non Af Amer 28 (*)     GFR calc Af Amer 33 (*)     All other components within normal limits  GLUCOSE, CAPILLARY - Abnormal; Notable for the following:    Glucose-Capillary 108 (*)     All other components within normal limits  POCT I-STAT TROPONIN I  LAB REPORT - SCANNED   No results found.   1. Fall   2. Scalp laceration    LACERATION REPAIR Performed by: Billee Cashing. Authorized by: Billee Cashing Consent: Verbal consent obtained. Risks and benefits: risks, benefits and alternatives were discussed Consent given by: patient Patient identity confirmed: provided demographic data Prepped and Draped in normal sterile fashion Wound explored  Laceration Location: scalp  Laceration Length:3cm  No Foreign Bodies seen or palpated   Amount of cleaning: standard  Skin closure: staples  Number of sutures: 3    Patient tolerance: Patient tolerated the procedure well with no immediate complications.   MDM  Patient with mechanical fall. Scalp laceration. Negative head CT discharged home hyponatremia is chronic.        Russell [name]. Russell Payor, MD 03/22/12 [phone]
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
## 2012-08-25 ENCOUNTER — Encounter (HOSPITAL_COMMUNITY): Payer: Self-pay | Admitting: Emergency Medicine

## 2012-08-25 ENCOUNTER — Emergency Department (HOSPITAL_COMMUNITY): Payer: Medicare Other

## 2012-08-25 ENCOUNTER — Other Ambulatory Visit: Payer: Self-pay | Admitting: Orthopaedic Surgery

## 2012-08-25 ENCOUNTER — Inpatient Hospital Stay (HOSPITAL_COMMUNITY)
Admission: EM | Admit: 2012-08-25 | Discharge: 2012-08-29 | DRG: 470 | Disposition: A | Discharge status: Skilled Nursing Facility | Payer: Medicare Other | Attending: Family Medicine | Admitting: Family Medicine

## 2012-08-25 DIAGNOSIS — R531 Weakness: Secondary | ICD-10-CM | POA: Diagnosis present

## 2012-08-25 DIAGNOSIS — S72001A Fracture of unspecified part of neck of right femur, initial encounter for closed fracture: Secondary | ICD-10-CM

## 2012-08-25 DIAGNOSIS — R5381 Other malaise: Secondary | ICD-10-CM | POA: Diagnosis present

## 2012-08-25 DIAGNOSIS — S72001D Fracture of unspecified part of neck of right femur, subsequent encounter for closed fracture with routine healing: Secondary | ICD-10-CM

## 2012-08-25 DIAGNOSIS — N183 Chronic kidney disease, stage 3 unspecified: Secondary | ICD-10-CM | POA: Diagnosis present

## 2012-08-25 DIAGNOSIS — R918 Other nonspecific abnormal finding of lung field: Secondary | ICD-10-CM | POA: Diagnosis present

## 2012-08-25 DIAGNOSIS — N400 Enlarged prostate without lower urinary tract symptoms: Secondary | ICD-10-CM | POA: Diagnosis present

## 2012-08-25 DIAGNOSIS — N189 Chronic kidney disease, unspecified: Secondary | ICD-10-CM | POA: Diagnosis present

## 2012-08-25 DIAGNOSIS — Z9181 History of falling: Secondary | ICD-10-CM

## 2012-08-25 DIAGNOSIS — K5900 Constipation, unspecified: Secondary | ICD-10-CM | POA: Diagnosis present

## 2012-08-25 DIAGNOSIS — J189 Pneumonia, unspecified organism: Secondary | ICD-10-CM

## 2012-08-25 DIAGNOSIS — I129 Hypertensive chronic kidney disease with stage 1 through stage 4 chronic kidney disease, or unspecified chronic kidney disease: Secondary | ICD-10-CM | POA: Diagnosis present

## 2012-08-25 DIAGNOSIS — E871 Hypo-osmolality and hyponatremia: Secondary | ICD-10-CM | POA: Diagnosis present

## 2012-08-25 DIAGNOSIS — D62 Acute posthemorrhagic anemia: Secondary | ICD-10-CM | POA: Diagnosis not present

## 2012-08-25 DIAGNOSIS — Z66 Do not resuscitate: Secondary | ICD-10-CM | POA: Diagnosis present

## 2012-08-25 DIAGNOSIS — N19 Unspecified kidney failure: Secondary | ICD-10-CM | POA: Diagnosis present

## 2012-08-25 DIAGNOSIS — E876 Hypokalemia: Secondary | ICD-10-CM | POA: Diagnosis not present

## 2012-08-25 DIAGNOSIS — W010XXA Fall on same level from slipping, tripping and stumbling without subsequent striking against object, initial encounter: Secondary | ICD-10-CM | POA: Diagnosis present

## 2012-08-25 DIAGNOSIS — Y9210 Unspecified residential institution as the place of occurrence of the external cause: Secondary | ICD-10-CM | POA: Diagnosis present

## 2012-08-25 DIAGNOSIS — S72009A Fracture of unspecified part of neck of unspecified femur, initial encounter for closed fracture: Principal | ICD-10-CM | POA: Diagnosis present

## 2012-08-25 DIAGNOSIS — D649 Anemia, unspecified: Secondary | ICD-10-CM | POA: Diagnosis present

## 2012-08-25 DIAGNOSIS — R222 Localized swelling, mass and lump, trunk: Secondary | ICD-10-CM | POA: Diagnosis present

## 2012-08-25 DIAGNOSIS — D509 Iron deficiency anemia, unspecified: Secondary | ICD-10-CM | POA: Diagnosis present

## 2012-08-25 DIAGNOSIS — F29 Unspecified psychosis not due to a substance or known physiological condition: Secondary | ICD-10-CM | POA: Diagnosis present

## 2012-08-25 LAB — BASIC METABOLIC PANEL
CO2: 26 mEq/L (ref 19–32)
Calcium: 8.8 mg/dL (ref 8.4–10.5)
Creatinine, Ser: 1.5 mg/dL — ABNORMAL HIGH (ref 0.50–1.35)
GFR calc non Af Amer: 38 mL/min — ABNORMAL LOW (ref >90)
Sodium: 138 mEq/L (ref 135–145)

## 2012-08-25 LAB — PROTIME-INR: Prothrombin Time: 14.2 seconds (ref 11.6–15.2)

## 2012-08-25 LAB — CBC WITH DIFFERENTIAL/PLATELET
Basophils Absolute: 0 10*3/uL (ref 0.0–0.1)
Eosinophils Absolute: 0.1 10*3/uL (ref 0.0–0.7)
Eosinophils Relative: 2 % (ref 0–5)
HCT: 29 % — ABNORMAL LOW (ref 39.0–52.0)
Lymphocytes Relative: 12 % (ref 12–46)
MCH: 29.8 pg (ref 26.0–34.0)
MCHC: 34.1 g/dL (ref 30.0–36.0)
MCV: 87.3 fL (ref 78.0–100.0)
Monocytes Absolute: 0.6 10*3/uL (ref 0.1–1.0)
Platelets: 178 10*3/uL (ref 150–400)
RDW: 14.6 % (ref 11.5–15.5)
WBC: 6.9 10*3/uL (ref 4.0–10.5)

## 2012-08-25 MED ORDER — ENOXAPARIN SODIUM 40 MG/0.4ML ~~LOC~~ SOLN
40.0000 mg | SUBCUTANEOUS | Status: DC
  Filled 2012-08-25 (×2): qty 0.4

## 2012-08-25 MED ORDER — SERTRALINE HCL 100 MG PO TABS
100.0000 mg | ORAL_TABLET | Freq: Every day | ORAL | Status: DC
  Administered 2012-08-26 – 2012-08-29 (×4): 100 mg via ORAL
  Filled 2012-08-25 (×4): qty 1

## 2012-08-25 MED ORDER — DIAZEPAM 5 MG PO TABS
5.0000 mg | ORAL_TABLET | Freq: Two times a day (BID) | ORAL | Status: DC | PRN
  Administered 2012-08-26 – 2012-08-28 (×4): 5 mg via ORAL
  Filled 2012-08-25 (×5): qty 1

## 2012-08-25 MED ORDER — MORPHINE SULFATE 2 MG/ML IJ SOLN
0.5000 mg | INTRAMUSCULAR | Status: DC | PRN
  Administered 2012-08-25 – 2012-08-27 (×4): 0.5 mg via INTRAVENOUS
  Filled 2012-08-25 (×4): qty 1

## 2012-08-25 MED ORDER — DEXTROSE-NACL 5-0.9 % IV SOLN
INTRAVENOUS | Status: DC
  Administered 2012-08-25: 17:00:00 via INTRAVENOUS

## 2012-08-25 MED ORDER — ONDANSETRON HCL 4 MG/2ML IJ SOLN
4.0000 mg | Freq: Three times a day (TID) | INTRAMUSCULAR | Status: AC | PRN
  Administered 2012-08-25: 4 mg via INTRAVENOUS
  Filled 2012-08-25: qty 2

## 2012-08-25 MED ORDER — HYDROCODONE-ACETAMINOPHEN 5-325 MG PO TABS
1.0000 | ORAL_TABLET | Freq: Four times a day (QID) | ORAL | Status: DC | PRN
  Administered 2012-08-25: 2 via ORAL
  Administered 2012-08-26 (×2): 1 via ORAL
  Administered 2012-08-26: 2 via ORAL
  Administered 2012-08-27: 1 via ORAL
  Filled 2012-08-25 (×3): qty 1
  Filled 2012-08-25: qty 2
  Filled 2012-08-25 (×2): qty 1
  Filled 2012-08-25: qty 2

## 2012-08-25 MED ORDER — LORAZEPAM 0.5 MG PO TABS
0.5000 mg | ORAL_TABLET | Freq: Four times a day (QID) | ORAL | Status: DC | PRN
  Administered 2012-08-25 – 2012-08-29 (×5): 0.5 mg via ORAL
  Filled 2012-08-25 (×5): qty 1

## 2012-08-25 MED ORDER — FINASTERIDE 5 MG PO TABS
5.0000 mg | ORAL_TABLET | Freq: Every day | ORAL | Status: DC
  Administered 2012-08-25 – 2012-08-28 (×4): 5 mg via ORAL
  Filled 2012-08-25 (×5): qty 1

## 2012-08-25 MED ORDER — HYDROMORPHONE HCL PF 1 MG/ML IJ SOLN
0.5000 mg | Freq: Once | INTRAMUSCULAR | Status: AC
  Administered 2012-08-25: 0.5 mg via INTRAMUSCULAR
  Filled 2012-08-25: qty 1

## 2012-08-25 MED ORDER — HYDROCODONE-ACETAMINOPHEN 5-325 MG PO TABS: 1.0000 | ORAL_TABLET | ORAL | Status: DC | PRN

## 2012-08-25 MED ORDER — TAMSULOSIN HCL 0.4 MG PO CAPS
0.4000 mg | ORAL_CAPSULE | Freq: Every day | ORAL | Status: DC
  Administered 2012-08-25 – 2012-08-28 (×4): 0.4 mg via ORAL
  Filled 2012-08-25 (×5): qty 1

## 2012-08-25 NOTE — Consult Note (Signed)
Reason for Consult:   Hip fracture Referring Physician:    EDP  Russell [name] is an 79 y.o. male  NHR who is a poor historian and beset with some long term psych issues. Could not get OOB today and was taken to ED where xrays showed femoral neck fracture.  Admitted by medical team through hip fracture pathway.  Difficult to communicate with but denies pain elsewhere.    Past Medical History  Diagnosis Date  . Stroke   . Acute bronchitis   . Hypertension     Past Surgical History  Procedure Laterality Date  . Hernia repair    . Lumbar disc surgery      Family History  Problem Relation Age of Onset  . CVA Mother     Social History:  reports that he has never smoked. He has never used smokeless tobacco. He reports that he does not drink alcohol or use illicit drugs.  Allergies:  Allergies  Allergen Reactions  . Ambien (Zolpidem Tartrate) Other (See Comments)    Unknown reaction  . Penicillins Other (See Comments)    Unknown reaction    Medications: I have reviewed the patient's current medications.  Results for orders placed during the hospital encounter of 08/25/12 (from the past 48 hour(s))  CBC WITH DIFFERENTIAL     Status: Abnormal   Collection Time    08/25/12 12:40 PM      Result Value Range   WBC 6.9  4.0 - 10.5 K/uL   RBC 3.32 (*) 4.22 - 5.81 MIL/uL   Hemoglobin 9.9 (*) 13.0 - 17.0 g/dL   HCT 29.0 (*) 39.0 - 52.0 %   MCV 87.3  78.0 - 100.0 fL   MCH 29.8  26.0 - 34.0 pg   MCHC 34.1  30.0 - 36.0 g/dL   RDW 14.6  11.5 - 15.5 %   Platelets 178  150 - 400 K/uL   Neutrophils Relative % 77  43 - 77 %   Neutro Abs 5.3  1.7 - 7.7 K/uL   Lymphocytes Relative 12  12 - 46 %   Lymphs Abs 0.8  0.7 - 4.0 K/uL   Monocytes Relative 8  3 - 12 %   Monocytes Absolute 0.6  0.1 - 1.0 K/uL   Eosinophils Relative 2  0 - 5 %   Eosinophils Absolute 0.1  0.0 - 0.7 K/uL   Basophils Relative 0  0 - 1 %   Basophils Absolute 0.0  0.0 - 0.1 K/uL  BASIC METABOLIC PANEL     Status:  Abnormal   Collection Time    08/25/12 12:40 PM      Result Value Range   Sodium 138  135 - 145 mEq/L   Potassium 3.4 (*) 3.5 - 5.1 mEq/L   Chloride 100  96 - 112 mEq/L   CO2 26  19 - 32 mEq/L   Glucose, Bld 128 (*) 70 - 99 mg/dL   BUN 21  6 - 23 mg/dL   Creatinine, Ser 1.50 (*) 0.50 - 1.35 mg/dL   Calcium 8.8  8.4 - 10.5 mg/dL   GFR calc non Af Amer 38 (*) >90 mL/min   GFR calc Af Amer 44 (*) >90 mL/min   Comment:            The eGFR has been calculated     using the CKD EPI equation.     This calculation has not been     validated in all clinical       situations.     eGFR's persistently     <90 mL/min signify     possible Chronic Kidney Disease.  PROTIME-INR     Status: None   Collection Time    08/25/12 12:40 PM      Result Value Range   Prothrombin Time 14.2  11.6 - 15.2 seconds   INR 1.11  0.00 - 1.49    Dg Ribs Unilateral W/chest Right  08/25/2012   *RADIOLOGY REPORT*  Clinical Data: Fall, posterior rib pain  RIGHT RIBS AND CHEST - 3+ VIEW  Comparison: 02/07/2012  Findings: Five views right ribs submitted.  Cardiomediastinal silhouette is stable.  No acute infiltrate or pulmonary edema.  No right rib fracture is identified.  No diagnostic pneumothorax.  IMPRESSION: No rib fracture is identified.  No diagnostic pneumothorax.   Original Report Authenticated By: Liviu Pop, M.D.   Dg Hip Complete Right  08/25/2012   *RADIOLOGY REPORT*  Clinical Data: Fall, right hip pain  RIGHT HIP - COMPLETE 2+ VIEW  Comparison: None.  Findings: Four views of the right hip submitted.  Study is limited by diffuse osteopenia.  There is mild displaced impacted fracture of the right femoral neck .Old fracture deformity of the mid femoral shaft.  IMPRESSION: Mild displaced fracture of the right femoral neck.  Diffuse osteopenia.  Per CMS PQRS reporting requirements (PQRS Measure 24): Given the patient's age of greater than 50 and the fracture site (hip, distal radius, or spine), the patient should be  tested for osteoporosis using DXA, and the appropriate treatment considered based on the DXA results.   Original Report Authenticated By: Liviu Pop, M.D.   Dg Knee 2 Views Right  08/25/2012   *RADIOLOGY REPORT*  Clinical Data: Fall  RIGHT KNEE - 1-2 VIEW  Comparison: None.  Findings: Two views of the right knee submitted.  No acute fracture or subluxation.  There is diffuse osteopenia.  IMPRESSION: No acute fracture or subluxation.  Diffuse osteopenia.   Original Report Authenticated By: Liviu Pop, M.D.   Dg Chest Port 1 View  08/25/2012   *RADIOLOGY REPORT*  Clinical Data: Preop, fall  PORTABLE CHEST - 1 VIEW  Comparison: 02/07/2012  Findings: Cardiomediastinal silhouette is stable.  No acute infiltrate or pulmonary edema.  No diagnostic pneumothorax.  IMPRESSION: No active disease.  No diagnostic pneumothorax.   Original Report Authenticated By: Liviu Pop, M.D.    @ROS@ Blood pressure 138/79, pulse 66, temperature 98.1 F (36.7 C), temperature source Oral, resp. rate 20, SpO2 94.00%.  PHYSICAL EXAM:   ABD soft Neurovascular intact Sensation intact distally Intact pulses distally Dorsiflexion/Plantar flexion intact Compartment soft  Right leg SER and painful to rotation Other leg and both arms move well   ASSESSMENT:   Right femoral neck fracture with previous femur fracture  PLAN:   Needs hemi to sit and hopefully stand again.  Will be a little difficult logistically due to mental issues and physically due to his old displaced femur fracture.  Can overcome physical issue with cemented short stem.  Plan surgery for tomorrow about 1pm.  Plan on ASA bid times four weeks for anticoagulation.  With frequent falls I do not think coumadin would be good idea. Hopefully back to [facility] postop assuming they offer correct level of care.  Waynette Towers G 08/25/2012, 6:33 PM      

## 2012-08-25 NOTE — ED Notes (Signed)
[id number]<WG> Expected date:<BR> Expected time:<BR> Means of arrival:<BR> Comments:<BR> Knee pain

## 2012-08-25 NOTE — Progress Notes (Signed)
[facility] History and Physical  Russell [name] ZOX:[id number] DOB: [birth year] DOA: 08/25/2012  Referring physician: Jeraldine [name], EDP PCP: No primary provider on file.  Specialists: Russell [name]  Chief Complaint: Accidental fall with hip pain  HPI: Russell [name] is a 79 y.o. male with h/o CVA, H/o htn presented from [facility] with fall this 08/24/12 with therapy.  He apparently was offered Hospital transport 5/17 and refused the same.  Because he wasn't able to move or get out of bed this am, he was transported to the Ed for further evaluation   It was a witnessed fall with no apparent SOB or dizzyness at the time.  Patient is a relatively poor historian and hence it is difficult to obtain a meaningful ros  Called and spoke with Son Dr. Gwinda [name] [c] [phone] retired]-He allegedly fell.  He refused transport and complained of more back pain.  He has a minimally diplaced femoral neck fracture.  He does have and advanced directive statin DNR.  His reg doc is as Equities trader house calls.  He has Htn.  He has had psychiatric issues all of his life per son.  He will pretend he doesn't hear you if he does not like for you have to say to him.  His son state that it is difficult to care for occasionally  Review of Systems:   Past Medical History  Diagnosis Date  . Stroke   . Acute bronchitis   . Hypertension    Admission 02/05/12 for hyponatremia and N/v and potential lung mass Admission 05/20/99 for Lateral herniated nucleus pulposus L 4-5  Past Surgical History  Procedure Laterality Date  . Hernia repair    . Lumbar disc surgery     Social History:  reports that he has never smoked. He has never used smokeless tobacco. He reports that he does not drink alcohol or use illicit drugs.   Allergies  Allergen Reactions  . Ambien (Zolpidem Tartrate) Other (See Comments)    Unknown reaction  . Penicillins Other (See Comments)    Unknown reaction    Family History   Problem Relation Age of Onset  . CVA Mother      Prior to Admission medications   Medication Sig Start Date End Date Taking? Authorizing Provider  acetaminophen (TYLENOL) 325 MG tablet Take 650 mg by mouth every 6 (six) hours as needed. For pain.   Yes Historical Provider, MD  bisacodyl (DULCOLAX) 10 MG suppository Place 10 mg rectally as needed for constipation (constipation).   Yes Historical Provider, MD  cholecalciferol (VITAMIN D) 1000 UNITS tablet Take 2,000 Units by mouth daily.   Yes Historical Provider, MD  diazepam (VALIUM) 5 MG tablet Take 5 mg by mouth 2 (two) times daily as needed. Take 5mg  daily as needed for anxiety and take 5mg  every night at bedtime. 02/11/12  Yes Joseph Art, DO  Ensure Plus (ENSURE PLUS) LIQD Take 237 mLs by mouth.   Yes Historical Provider, MD  finasteride (PROSCAR) 5 MG tablet Take 5 mg by mouth at bedtime.   Yes Historical Provider, MD  LORazepam (ATIVAN) 0.5 MG tablet Take 0.5 mg by mouth every 6 (six) hours as needed for anxiety (anxiety).   Yes Historical Provider, MD  omeprazole (PRILOSEC) 40 MG capsule Take 40 mg by mouth daily before breakfast.   Yes Historical Provider, MD  polyethylene glycol (MIRALAX / GLYCOLAX) packet Take 17 g by mouth daily as needed. 02/11/12  Yes Joseph Art, DO  ranitidine (ZANTAC) 300 MG tablet Take 300 mg by mouth at bedtime.   Yes Historical Provider, MD  senna-docusate (SENOKOT-S) 8.6-50 MG per tablet Take 1 tablet by mouth 2 (two) times daily.   Yes Historical Provider, MD  sertraline (ZOLOFT) 100 MG tablet Take 100 mg by mouth daily.   Yes Historical Provider, MD  sorbitol 70 % solution Take 5 mLs by mouth daily.   Yes Historical Provider, MD  Tamsulosin HCl (FLOMAX) 0.4 MG CAPS Take 0.4 mg by mouth daily after supper.   Yes Historical Provider, MD  traMADol (ULTRAM) 50 MG tablet Take 50 mg by mouth every 6 (six) hours as needed for pain (pain).   Yes Historical Provider, MD   Physical Exam: Filed Vitals:    08/25/12 1001 08/25/12 1006  BP:  138/79  Pulse:  66  Temp:  98.1 F (36.7 C)  TempSrc:  Oral  Resp:  20  SpO2: 99% 94%     General:  Uncooperative slightly disheveled Caucasian male no apparent distress  Eyes: EOMI no pallor or icterus  ENT: Moderate dentition  Neck: Soft supple  Cardiovascular: S1-S2 no murmur rub or gallop  Respiratory: Grossly clear  Abdomen: Soft nontender nondistended  Musculoskeletal: Range of motion limited on the right given patient has hip fracture  Psychiatric: Uncooperative hard to assess  Neurologic: Uncooperative and hard to assess  Labs on Admission:  Basic Metabolic Panel: No results found for this basename: NA, K, CL, CO2, GLUCOSE, BUN, CREATININE, CALCIUM, MG, PHOS,  in the last 168 hours Liver Function Tests: No results found for this basename: AST, ALT, ALKPHOS, BILITOT, PROT, ALBUMIN,  in the last 168 hours No results found for this basename: LIPASE, AMYLASE,  in the last 168 hours No results found for this basename: AMMONIA,  in the last 168 hours CBC: No results found for this basename: WBC, NEUTROABS, HGB, HCT, MCV, PLT,  in the last 168 hours Cardiac Enzymes: No results found for this basename: CKTOTAL, CKMB, CKMBINDEX, TROPONINI,  in the last 168 hours  BNP (last 3 results) No results found for this basename: PROBNP,  in the last 8760 hours CBG: No results found for this basename: GLUCAP,  in the last 168 hours  Radiological Exams on Admission: Dg Ribs Unilateral W/chest Right  08/25/2012   *RADIOLOGY REPORT*  Clinical Data: Fall, posterior rib pain  RIGHT RIBS AND CHEST - 3+ VIEW  Comparison: 02/07/2012  Findings: Five views right ribs submitted.  Cardiomediastinal silhouette is stable.  No acute infiltrate or pulmonary edema.  No right rib fracture is identified.  No diagnostic pneumothorax.  IMPRESSION: No rib fracture is identified.  No diagnostic pneumothorax.   Original Report Authenticated By: Natasha Mead, M.D.   Dg  Hip Complete Right  08/25/2012   *RADIOLOGY REPORT*  Clinical Data: Fall, right hip pain  RIGHT HIP - COMPLETE 2+ VIEW  Comparison: None.  Findings: Four views of the right hip submitted.  Study is limited by diffuse osteopenia.  There is mild displaced impacted fracture of the right femoral neck .Old fracture deformity of the mid femoral shaft.  IMPRESSION: Mild displaced fracture of the right femoral neck.  Diffuse osteopenia.  Per CMS PQRS reporting requirements (PQRS Measure 24): Given the patient's age of greater than 50 and the fracture site (hip, distal radius, or spine), the patient should be tested for osteoporosis using DXA, and the appropriate treatment considered based on the DXA results.   Original Report Authenticated By: Natasha Mead, M.D.  Dg Knee 2 Views Right  08/25/2012   *RADIOLOGY REPORT*  Clinical Data: Fall  RIGHT KNEE - 1-2 VIEW  Comparison: None.  Findings: Two views of the right knee submitted.  No acute fracture or subluxation.  There is diffuse osteopenia.  IMPRESSION: No acute fracture or subluxation.  Diffuse osteopenia.   Original Report Authenticated By: Natasha Mead, M.D.   Dg Chest Port 1 View  08/25/2012   *RADIOLOGY REPORT*  Clinical Data: Preop, fall  PORTABLE CHEST - 1 VIEW  Comparison: 02/07/2012  Findings: Cardiomediastinal silhouette is stable.  No acute infiltrate or pulmonary edema.  No diagnostic pneumothorax.  IMPRESSION: No active disease.  No diagnostic pneumothorax.   Original Report Authenticated By: Natasha Mead, M.D.    EKG: Independently reviewed. Sinus rhythm first degree AV block no ST-T wave changes QRS axis 45 and peaked T waves throughout  Assessment/Plan Principal Problem:   Fracture of right hip Active Problems:   Hyponatremia   Renal failure, unspecified   Weakness   Anemia   Lung mass    1. Right hip fracture-overall has no systemic serious illnesses that would limit fracture. Risk of this operation is intermediate given nature of surgery.  For pain management, postop weightbearing, postop anticoagulation to be addressed by  orthopedic surgeon Dr. Fara Chute who will be seeing him in consult and potentially operating on him today-appreciate assitance 2. Question mark history lung mass-this is not apparent on chest x-ray. Son states that family's decision was not admitted for this up anyway. 3. Borderline microcytic anemia. Transfusion threshold of less than 7. Monitor CBC in a.m. 4. 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 Continue sertraline 100 mg daily 5. Constipation continue sorbitol senna MiraLax as needed postoperatively 6. ? Benign prostatic hyperplasia- continue Flomax 0.4 mg each bedtime, Proscar 5 mg each bedtime  See above  Code Status: DO NOT RESUSCITATE status other than for surgery  Family Communication: Discussed with the sons, one of whom is a hospitalist over telephone at 4 number [phone] Disposition Plan: Inpatient MedSurg   Time spent: 57  Mahala Menghini [facility] Pager [phone]  If 7PM-7AM, please contact night-coverage [email or web address] Password [email or web address] 08/25/2012, 1:02 PM

## 2012-08-25 NOTE — Progress Notes (Signed)
CSW attempted to complete assessment, however patient currently being transferred to inpatient floor. Per chart review, patient is a resident at [facility]. CSW will complete fl2 for md signature and inform unit CSW.   Catha Gosselin, LCSWA  [phone] .08/25/2012 1440pm

## 2012-08-25 NOTE — ED Provider Notes (Addendum)
History     CSN: [id number]  Arrival date & time 08/25/12  0102   First MD Initiated Contact with Patient 08/25/12 1001      Chief Complaint  Patient presents with  . Fall    (Consider location/radiation/quality/duration/timing/severity/associated sxs/prior treatment) Patient is a 79 y.o. male presenting with fall. The history is provided by the nursing home and the patient. The history is limited by the condition of the patient.  Fall The accident occurred 1 to 2 hours ago. The fall occurred while walking. He fell from a height of 1 to 2 ft. Point of impact: unknwon. The pain is present in the right knee and right hip (r inferior axilla). The pain is moderate. He was not ambulatory at the scene. There was no entrapment after the fall. There was no drug use involved in the accident. There was no alcohol use involved in the accident. Pertinent negatives include no visual change, no abdominal pain, no nausea, no vomiting, no headaches and no loss of consciousness. The symptoms are aggravated by ambulation and pressure on the injury. He has tried nothing for the symptoms.  Level V caveat 2/2 dementia  Past Medical History  Diagnosis Date  . Stroke   . Acute bronchitis   . Hypertension     Past Surgical History  Procedure Laterality Date  . Hernia repair    . Lumbar disc surgery      Family History  Problem Relation Age of Onset  . CVA Mother     History  Substance Use Topics  . Smoking status: Never Smoker   . Smokeless tobacco: Not on file  . Alcohol Use: No      Review of Systems  Unable to perform ROS: Dementia  Gastrointestinal: Negative for nausea, vomiting and abdominal pain.  Neurological: Negative for loss of consciousness and headaches.    Allergies  Ambien and Penicillins  Home Medications   Current Outpatient Rx  Name  Route  Sig  Dispense  Refill  . acetaminophen (TYLENOL) 325 MG tablet   Oral   Take 650 mg by mouth every 6 (six) hours as needed.  For pain.         . bisacodyl (DULCOLAX) 5 MG EC tablet   Oral   Take 10 mg by mouth daily.         . cholecalciferol (VITAMIN D) 1000 UNITS tablet   Oral   Take 2,000 Units by mouth daily.         . citalopram (CELEXA) 20 MG tablet   Oral   Take 20 mg by mouth daily.         . diazepam (VALIUM) 5 MG tablet   Oral   Take 5 mg by mouth 2 (two) times daily as needed. Take 5mg  daily as needed for anxiety and take 5mg  every night at bedtime.         . docusate sodium (COLACE) 100 MG capsule   Oral   Take 100 mg by mouth 2 (two) times daily.         . finasteride (PROSCAR) 5 MG tablet   Oral   Take 5 mg by mouth at bedtime.         Marland Kitchen guaiFENesin (MUCINEX) 600 MG 12 hr tablet   Oral   Take 1 tablet (600 mg total) by mouth 2 (two) times daily as needed.   30 tablet   0   . hydrOXYzine (ATARAX/VISTARIL) 10 MG tablet   Oral  Take 10 mg by mouth 3 (three) times daily.         Marland Kitchen omeprazole (PRILOSEC) 40 MG capsule   Oral   Take 40 mg by mouth daily before breakfast.         . polyethylene glycol (MIRALAX / GLYCOLAX) packet   Oral   Take 17 g by mouth daily as needed.   14 each   0   . ranitidine (ZANTAC) 300 MG tablet   Oral   Take 300 mg by mouth at bedtime.         . sorbitol 70 % solution   Oral   Take 5 mLs by mouth daily.         . Tamsulosin HCl (FLOMAX) 0.4 MG CAPS   Oral   Take 0.4 mg by mouth daily after supper.           BP 138/79  Pulse 66  Temp(Src) 98.1 F (36.7 C) (Oral)  Resp 20  SpO2 94%  Physical Exam  Nursing note and vitals reviewed. Constitutional: He appears well-developed. No distress.  HENT:  Head: Normocephalic and atraumatic.  Eyes: Conjunctivae and EOM are normal.  Neck: No spinous process tenderness and no muscular tenderness present. No rigidity. No edema present.  Cardiovascular: Normal rate and regular rhythm.   Pulmonary/Chest: Effort normal. No stridor. No respiratory distress.  Abdominal: Soft.  He exhibits no distension. There is no tenderness. There is no rebound and no guarding.  Musculoskeletal: He exhibits no edema.       Right shoulder: Normal.       Right elbow: Normal.      Right wrist: Normal.       Right hip: He exhibits tenderness and bony tenderness. He exhibits no swelling, no crepitus and no deformity.       Left hip: Normal.       Left knee: Normal.       Left ankle: Normal.  ttp about R inferior axilla - no deformity  R leg is shortened  Neurological: He is alert. No cranial nerve deficit. He exhibits normal muscle tone. Coordination normal.  Skin: Skin is warm and dry.     Psychiatric: He has a normal mood and affect. His speech is normal and behavior is normal. Thought content is delusional. Cognition and memory are impaired. He exhibits abnormal recent memory and abnormal remote memory.    ED Course  Procedures (including critical care time)  Labs Reviewed - No data to display No results found.   No diagnosis found.  XR demonstrate R femoral neck fracture  I have consulted ortho Margreta Journey), and the patient will be admitted to the hospitalist service for further E/M.   Date: 08/25/2012  Rate: 77  Rhythm: normal sinus rhythm  QRS Axis: normal  Intervals: PR prolonged  ST/T Wave abnormalities: normal  Conduction Disutrbances:first-degree A-V block   Narrative Interpretation:   Old EKG Reviewed: changes noted Longer PR, abnormal   1:12 PM The patient's children are here.  One of them is a physician.  We discussed all results are to  MDM  This elderly male presents from his nursing home following a fall.  On exam the patient has tenderness palpation about the right hip, shortening, and has x-rays consistent with right femoral neck fracture.  The patient seemed stable, was admitted to the hospitalist service for further evaluation and management of her spoke with our orthopedic colleagues        Gerhard Munch, MD 08/25/12 1203  Gerhard Munch, MD 08/25/12 [phone]

## 2012-08-25 NOTE — Clinical Documentation Improvement (Signed)
PATHOLOGICAL FX DOCUMENTATION CLARIFICATION QUERY  THIS DOCUMENT IS NOT A PERMANENT PART OF THE MEDICAL RECORD  TO RESPOND TO THE THIS QUERY, FOLLOW THE INSTRUCTIONS BELOW:  1. If needed, update documentation for the patient's encounter via the notes activity.  2. Access this query again and click edit on the In [employer].  3. After updating, or not, click F2 to complete all highlighted (required) fields concerning your review. Select "additional documentation in the medical record" OR "no additional documentation provided".  4. Click Sign note button.  5. The deficiency will fall out of your In Basket *Please let us know if you are not able to complete this workflow by phone or e-mail (listed below).  Please update your documentation within the medical record to reflect your response to this query.                                                                                    08/25/12  Dear Craig Guess, J or Dr. Cena Benton, LAMA, [facility], (Query re-assigned on Aug 26, 2012)  In a better effort to capture your patient's severity of illness, reflect appropriate length of stay and utilization of resources, a review of the patient medical record has revealed the following indicators.    Based on your clinical judgment, please clarify and document in a progress note and/or discharge summary the clinical condition associated with the following supporting information:  In responding to this query please exercise your independent judgment.  The fact that a query is asked, does not imply that any particular answer is desired or expected.  Pt admitted with fx of hip.  Pt with diffuse osteopenia per Xray report  Clarification Needed   Please clarify if the osteopenia is linked to the hip fx and qualifies for one of the diagnoses listed below and document in pn or d/c summary      Possible Clinical Conditions?  _______Pathological Fracture _______Pathological Fracture due to  slight trauma that is incongruent with injury _______Osteoporotic Fracture _______Traumatic fracture not pathological in nature _______Insufficiency Fracture _______Stress/Non-Traumatic Fracture _______Spontaneous Fracture _______Compression Fx due to (please specify bone disease) _______Other Condition________________ _______Cannot Clinically Determine   Supporting Information:  Risk Factors:     Hip fracture  Renal failure  Hyponatremia  Anemia  Constipation,   Signs & Symptoms:    Diagnostics: Radiology: Hip complete Xray 08/25/12 IMPRESSION:  Mild displaced fracture of the right femoral neck.Diffuse  Osteopenia  Treatments  Pain management   LORazepam (ATIVAN) tablet 0.5 mg        morphine 2 MG/ML injection 0.5 mg    You may use possible, probable, or suspect with inpatient documentation. possible, probable, suspected diagnoses MUST be documented at the time of discharge  Reviewed:  no additional documentation provided ljh  Thank You,  Enis Slipper  RN, BSN, MSN/Inf, CCDS Clinical Documentation Specialist [facility] Dept Pager: [phone] / E-mail: [email or web address] linical Documentation Specialist:  Pager  Health Information Management [place]

## 2012-08-25 NOTE — ED Notes (Signed)
Per EMS: from [facility], fell yesterday now c/o right side back pain with hx of chronic back pain.

## 2012-08-26 ENCOUNTER — Encounter (HOSPITAL_COMMUNITY)
Admission: EM | Disposition: A | Discharge status: Skilled Nursing Facility | Payer: Self-pay | Source: Home / Self Care | Attending: Family Medicine

## 2012-08-26 ENCOUNTER — Inpatient Hospital Stay (HOSPITAL_COMMUNITY): Payer: Medicare Other

## 2012-08-26 ENCOUNTER — Inpatient Hospital Stay (HOSPITAL_COMMUNITY): Payer: Medicare Other | Admitting: Anesthesiology

## 2012-08-26 ENCOUNTER — Encounter (HOSPITAL_COMMUNITY): Payer: Self-pay | Admitting: Anesthesiology

## 2012-08-26 ENCOUNTER — Inpatient Hospital Stay (HOSPITAL_COMMUNITY): Admission: RE | Admit: 2012-08-26 | Payer: Medicare Other | Source: Ambulatory Visit | Admitting: Orthopaedic Surgery

## 2012-08-26 DIAGNOSIS — D649 Anemia, unspecified: Secondary | ICD-10-CM

## 2012-08-26 DIAGNOSIS — S72009D Fracture of unspecified part of neck of unspecified femur, subsequent encounter for closed fracture with routine healing: Secondary | ICD-10-CM

## 2012-08-26 DIAGNOSIS — R222 Localized swelling, mass and lump, trunk: Secondary | ICD-10-CM

## 2012-08-26 HISTORY — PX: HIP ARTHROPLASTY: SHX981

## 2012-08-26 LAB — CBC
MCH: 29.6 pg (ref 26.0–34.0)
MCHC: 33.8 g/dL (ref 30.0–36.0)
MCV: 87.5 fL (ref 78.0–100.0)
Platelets: 171 10*3/uL (ref 150–400)
RDW: 14.9 % (ref 11.5–15.5)

## 2012-08-26 LAB — BASIC METABOLIC PANEL
CO2: 24 mEq/L (ref 19–32)
Calcium: 8.4 mg/dL (ref 8.4–10.5)
Creatinine, Ser: 1.52 mg/dL — ABNORMAL HIGH (ref 0.50–1.35)
GFR calc non Af Amer: 38 mL/min — ABNORMAL LOW (ref >90)
Glucose, Bld: 136 mg/dL — ABNORMAL HIGH (ref 70–99)
Sodium: 139 mEq/L (ref 135–145)

## 2012-08-26 LAB — ABO/RH: ABO/RH(D): O POS

## 2012-08-26 SURGERY — HEMIARTHROPLASTY, HIP, DIRECT ANTERIOR APPROACH, FOR FRACTURE
Anesthesia: Spinal | Site: Hip | Laterality: Right | Wound class: Clean

## 2012-08-26 MED ORDER — CLINDAMYCIN PHOSPHATE 600 MG/50ML IV SOLN
600.0000 mg | Freq: Four times a day (QID) | INTRAVENOUS | Status: AC
  Administered 2012-08-26 – 2012-08-27 (×2): 600 mg via INTRAVENOUS
  Filled 2012-08-26 (×2): qty 50

## 2012-08-26 MED ORDER — STERILE WATER FOR IRRIGATION IR SOLN
Status: DC | PRN
  Administered 2012-08-26: 1500 mL

## 2012-08-26 MED ORDER — MEPERIDINE HCL 50 MG/ML IJ SOLN: 6.2500 mg | INTRAMUSCULAR | Status: DC | PRN

## 2012-08-26 MED ORDER — PROPOFOL INFUSION 10 MG/ML OPTIME
INTRAVENOUS | Status: DC | PRN
  Administered 2012-08-26: 100 ug/kg/min via INTRAVENOUS

## 2012-08-26 MED ORDER — MENTHOL 3 MG MT LOZG: 1.0000 | LOZENGE | OROMUCOSAL | Status: DC | PRN

## 2012-08-26 MED ORDER — METOCLOPRAMIDE HCL 5 MG/ML IJ SOLN: 5.0000 mg | Freq: Three times a day (TID) | INTRAMUSCULAR | Status: DC | PRN

## 2012-08-26 MED ORDER — HYDROCODONE-ACETAMINOPHEN 5-325 MG PO TABS
1.0000 | ORAL_TABLET | Freq: Four times a day (QID) | ORAL | Status: DC | PRN
  Administered 2012-08-28 – 2012-08-29 (×2): 1 via ORAL

## 2012-08-26 MED ORDER — FENTANYL CITRATE 0.05 MG/ML IJ SOLN: 25.0000 ug | INTRAMUSCULAR | Status: DC | PRN

## 2012-08-26 MED ORDER — KETAMINE HCL 10 MG/ML IJ SOLN
INTRAMUSCULAR | Status: DC | PRN
  Administered 2012-08-26: 10 mg via INTRAVENOUS

## 2012-08-26 MED ORDER — CHLORHEXIDINE GLUCONATE 4 % EX LIQD
60.0000 mL | Freq: Once | CUTANEOUS | Status: AC
  Administered 2012-08-26: 4 via TOPICAL
  Filled 2012-08-26: qty 60

## 2012-08-26 MED ORDER — EPHEDRINE SULFATE 50 MG/ML IJ SOLN
INTRAMUSCULAR | Status: DC | PRN
  Administered 2012-08-26 (×3): 10 mg via INTRAVENOUS

## 2012-08-26 MED ORDER — ASPIRIN EC 325 MG PO TBEC: 325.0000 mg | DELAYED_RELEASE_TABLET | Freq: Two times a day (BID) | ORAL | Status: DC

## 2012-08-26 MED ORDER — METOCLOPRAMIDE HCL 10 MG PO TABS: 5.0000 mg | ORAL_TABLET | Freq: Three times a day (TID) | ORAL | Status: DC | PRN

## 2012-08-26 MED ORDER — ASPIRIN EC 325 MG PO TBEC
325.0000 mg | DELAYED_RELEASE_TABLET | Freq: Two times a day (BID) | ORAL | Status: DC
  Administered 2012-08-27 – 2012-08-29 (×5): 325 mg via ORAL
  Filled 2012-08-26 (×7): qty 1

## 2012-08-26 MED ORDER — PROMETHAZINE HCL 25 MG/ML IJ SOLN: 6.2500 mg | INTRAMUSCULAR | Status: DC | PRN

## 2012-08-26 MED ORDER — ACETAMINOPHEN 650 MG RE SUPP: 650.0000 mg | Freq: Four times a day (QID) | RECTAL | Status: DC | PRN

## 2012-08-26 MED ORDER — ACETAMINOPHEN 325 MG PO TABS
650.0000 mg | ORAL_TABLET | Freq: Four times a day (QID) | ORAL | Status: DC | PRN
  Administered 2012-08-28: 650 mg via ORAL
  Filled 2012-08-26: qty 2

## 2012-08-26 MED ORDER — PROPOFOL 10 MG/ML IV BOLUS
INTRAVENOUS | Status: DC | PRN
  Administered 2012-08-26: 15 mg via INTRAVENOUS

## 2012-08-26 MED ORDER — HYDROCODONE-ACETAMINOPHEN 5-325 MG PO TABS: 1.0000 | ORAL_TABLET | Freq: Four times a day (QID) | ORAL | Status: DC | PRN

## 2012-08-26 MED ORDER — ONDANSETRON HCL 4 MG PO TABS: 4.0000 mg | ORAL_TABLET | Freq: Four times a day (QID) | ORAL | Status: DC | PRN

## 2012-08-26 MED ORDER — PHENYLEPHRINE HCL 10 MG/ML IJ SOLN
INTRAMUSCULAR | Status: DC | PRN
  Administered 2012-08-26 (×2): 80 ug via INTRAVENOUS

## 2012-08-26 MED ORDER — CLINDAMYCIN PHOSPHATE 900 MG/50ML IV SOLN
INTRAVENOUS | Status: DC | PRN
  Administered 2012-08-26: 900 mg via INTRAVENOUS

## 2012-08-26 MED ORDER — LACTATED RINGERS IV SOLN
INTRAVENOUS | Status: DC | PRN
  Administered 2012-08-26 (×3): via INTRAVENOUS

## 2012-08-26 MED ORDER — LACTATED RINGERS IV SOLN: INTRAVENOUS | Status: DC

## 2012-08-26 MED ORDER — ONDANSETRON HCL 4 MG/2ML IJ SOLN
4.0000 mg | Freq: Four times a day (QID) | INTRAMUSCULAR | Status: DC | PRN
  Administered 2012-08-27 (×2): 4 mg via INTRAVENOUS
  Filled 2012-08-26 (×2): qty 2

## 2012-08-26 MED ORDER — FENTANYL CITRATE 0.05 MG/ML IJ SOLN
INTRAMUSCULAR | Status: DC | PRN
  Administered 2012-08-26: 50 ug via INTRAVENOUS

## 2012-08-26 MED ORDER — PHENOL 1.4 % MT LIQD: 1.0000 | OROMUCOSAL | Status: DC | PRN

## 2012-08-26 MED ORDER — MORPHINE SULFATE 2 MG/ML IJ SOLN
0.5000 mg | INTRAMUSCULAR | Status: DC | PRN
  Filled 2012-08-26: qty 1

## 2012-08-26 MED ORDER — SODIUM CHLORIDE 0.9 % IR SOLN
Status: DC | PRN
  Administered 2012-08-26: 3000 mL

## 2012-08-26 MED ORDER — PHENYLEPHRINE HCL 10 MG/ML IJ SOLN
20.0000 mg | INTRAVENOUS | Status: DC | PRN
  Administered 2012-08-26: 20 ug/min via INTRAVENOUS

## 2012-08-26 MED ORDER — MIDAZOLAM HCL 5 MG/5ML IJ SOLN
INTRAMUSCULAR | Status: DC | PRN
  Administered 2012-08-26: 1 mg via INTRAVENOUS

## 2012-08-26 SURGICAL SUPPLY — 48 items
BAG SPEC THK2 15X12 ZIP CLS (MISCELLANEOUS)
BAG ZIPLOCK 12X15 (MISCELLANEOUS) ×1 IMPLANT
BIT DRILL 2.4X128 (BIT) ×1 IMPLANT
BLADE SAW SAG 73X25 THK (BLADE) ×1
BLADE SAW SGTL 73X25 THK (BLADE) ×1 IMPLANT
BRUSH FEMORAL CANAL (MISCELLANEOUS) ×1 IMPLANT
CANISTER SUCTION 2500CC (MISCELLANEOUS) ×2 IMPLANT
CEMENT BONE DEPUY (Cement) ×3 IMPLANT
CLOTH BEACON ORANGE TIMEOUT ST (SAFETY) ×2 IMPLANT
COVER SURGICAL LIGHT HANDLE (MISCELLANEOUS) ×2 IMPLANT
DRAPE INCISE IOBAN 66X45 STRL (DRAPES) ×2 IMPLANT
DRAPE ORTHO SPLIT 77X108 STRL (DRAPES) ×4
DRAPE POUCH INSTRU U-SHP 10X18 (DRAPES) ×1 IMPLANT
DRAPE SURG ORHT 6 SPLT 77X108 (DRAPES) ×2 IMPLANT
DRAPE U-SHAPE 47X51 STRL (DRAPES) ×2 IMPLANT
DRAPE WARM FLUID 44X44 (DRAPE) ×1 IMPLANT
DRSG EMULSION OIL 3X16 NADH (GAUZE/BANDAGES/DRESSINGS) ×1 IMPLANT
DRSG MEPILEX BORDER 4X8 (GAUZE/BANDAGES/DRESSINGS) ×1 IMPLANT
DRSG PAD ABDOMINAL 8X10 ST (GAUZE/BANDAGES/DRESSINGS) ×2 IMPLANT
ELECT REM PT RETURN 9FT ADLT (ELECTROSURGICAL) ×2
ELECTRODE REM PT RTRN 9FT ADLT (ELECTROSURGICAL) ×1 IMPLANT
EVACUATOR 1/8 PVC DRAIN (DRAIN) ×2 IMPLANT
FACESHIELD LNG OPTICON STERILE (SAFETY) ×6 IMPLANT
GLOVE BIO SURGEON STRL SZ8.5 (GLOVE) ×2 IMPLANT
HANDPIECE INTERPULSE COAX TIP (DISPOSABLE) ×2
HOOD PEEL AWAY FACE SHEILD DIS (HOOD) ×2 IMPLANT
IMMOBILIZER KNEE 20 (SOFTGOODS)
IMMOBILIZER KNEE 20 THIGH 36 (SOFTGOODS) IMPLANT
KIT BASIN OR (CUSTOM PROCEDURE TRAY) ×2 IMPLANT
NDL MA TROC 1/2 (NEEDLE) ×1 IMPLANT
NEEDLE MA TROC 1/2 (NEEDLE) IMPLANT
PACK TOTAL JOINT (CUSTOM PROCEDURE TRAY) ×2 IMPLANT
PASSER SUT SWANSON 36MM LOOP (INSTRUMENTS) ×2 IMPLANT
POSITIONER SURGICAL ARM (MISCELLANEOUS) ×2 IMPLANT
PRESSURIZER FEMORAL UNIV (MISCELLANEOUS) ×1 IMPLANT
SET HNDPC FAN SPRY TIP SCT (DISPOSABLE) ×1 IMPLANT
SPONGE GAUZE 4X4 12PLY (GAUZE/BANDAGES/DRESSINGS) ×2 IMPLANT
STAPLER VISISTAT 35W (STAPLE) ×2 IMPLANT
STRIP CLOSURE SKIN 1/2X4 (GAUZE/BANDAGES/DRESSINGS) ×2 IMPLANT
SUT ETHIBOND NAB CT1 #1 30IN (SUTURE) ×8 IMPLANT
SUT MNCRL AB 4-0 PS2 18 (SUTURE) ×2 IMPLANT
SUT VIC AB 1 CT1 27 (SUTURE) ×6
SUT VIC AB 1 CT1 27XBRD ANTBC (SUTURE) ×3 IMPLANT
SUT VIC AB 2-0 CT1 27 (SUTURE) ×6
SUT VIC AB 2-0 CT1 TAPERPNT 27 (SUTURE) ×3 IMPLANT
TOWEL OR 17X26 10 PK STRL BLUE (TOWEL DISPOSABLE) ×2 IMPLANT
TOWER CARTRIDGE SMART MIX (DISPOSABLE) ×2 IMPLANT
TRAY FOLEY CATH 14FRSI W/METER (CATHETERS) ×1 IMPLANT

## 2012-08-26 NOTE — Op Note (Signed)
ROWYN SPILDE [id number] 08/26/2012   PRE-OP DIAGNOSIS: right femoral neck fracture  POST-OP DIAGNOSIS: same  PROCEDURE: right hip hemiarthroplasty  ANESTHESIA: spinal  Velna Ochs   Dictation #:  [id number]   May WBAT with PT Plan on ASA bid for DVT prophylaxis for 4 weeks

## 2012-08-26 NOTE — Anesthesia Postprocedure Evaluation (Signed)
  Anesthesia Post-op Note  Patient: Russell [name]  Procedure(s) Performed: Procedure(s) (LRB): RIGHT HEMI HIP ARTHROPLASTY  (Right)  Patient Location: PACU  Anesthesia Type: Spinal  Level of Consciousness: awake and alert confused  Airway and Oxygen Therapy: Patient Spontanous Breathing  Post-op Pain: mild  Post-op Assessment: Post-op Vital signs reviewed, Patient's Cardiovascular Status Stable, Respiratory Function Stable, Patent Airway and No signs of Nausea or vomiting  Last Vitals:  Filed Vitals:   08/26/12 1715  BP: 135/74  Pulse: 77  Temp: 36.4 C  Resp: 14    Post-op Vital Signs: stable   Complications: No apparent anesthesia complications

## 2012-08-26 NOTE — Progress Notes (Signed)
Clinical Social Work Department BRIEF PSYCHOSOCIAL ASSESSMENT 08/26/2012  Patient:  Russell [name], Russell [name]     Account Number:  [id number]     Admit date:  08/25/2012  Clinical Social Worker:  Candie Chroman  Date/Time:  08/26/2012 12:25 PM  Referred by:  Physician  Date Referred:  08/26/2012 Referred for  SNF Placement   Other Referral:   Interview type:  Family Other interview type:    PSYCHOSOCIAL DATA Living Status:  FACILITY Admitted from facility:  [facility] Level of care:  Assisted Living Primary support name:  Dr. Gwinda Maine Primary support relationship to patient:  CHILD, ADULT Degree of support available:   supportive    CURRENT CONCERNS Current Concerns  Post-Acute Placement   Other Concerns:    SOCIAL WORK ASSESSMENT / PLAN Pt is a 79 yr old gentleman admitted from [facility]. Pt sleeping soundly.  CSW spoke with pt's son to assist with d/c planning. Pt is scheduled for surgery today. PT recommendations are pending. [facility] cotacted and d/c planning discussed . ALF would be interested in pt transitioning back to them following a short time at SNF. SNF search will be initiated and bed offers provided to family.   Assessment/plan status:  Psychosocial Support/Ongoing Assessment of Needs Other assessment/ plan:   Information/referral to community resources:   SNF list with bed offers to be provided.    PATIENT'S/FAMILY'S RESPONSE TO PLAN OF CARE: Family would like pt to return to [facility]. Son is willing to consider [facility] Rehab placement prior to reurn to ALF.   Cori Razor LCSW [phone]

## 2012-08-26 NOTE — Progress Notes (Signed)
[facility] PROGRESS NOTE  Russell [name] ZOX:[id number] DOB: 07-29-1916 DOA: 08/25/2012 PCP: No primary provider on file.  Assessment/Plan: 1. Right femoral neck fracture - Management per Ortho.  Will defer pain medication to them and recommendations on dressing and wound care.  2. Anemia - Continue to monitor - obtain cbc next am and anemia panel if not already ordered.  3. Lung mass - No report on recent chest x ray - patient to follow up as outpatient with his pcp for continued evaluation and recommendations.   4. 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 BPH - Pt on Flomax  Code Status: full Family Communication:  No family at bedside.  Disposition Plan: Pending continued improvement in condition   Consultants:  Ortho  Procedures:  Right hip hemiarthroplasty  Antibiotics:  None  HPI/Subjective: Patient has no new complaints.  Objective: Filed Vitals:   08/26/12 1528 08/26/12 1530 08/26/12 1545 08/26/12 1617  BP: 128/68 128/68 141/65 136/65  Pulse: 71 74 73 62  Temp:   97.5 F (36.4 C) 97.6 F (36.4 C)  TempSrc:      Resp: 12 13 13 14   SpO2: 100% 100% 97% 97%    Intake/Output Summary (Last 24 hours) at 08/26/12 1629 Last data filed at 08/26/12 1545  Gross per 24 hour  Intake   2850 ml  Output    950 ml  Net   1900 ml   There were no vitals filed for this visit.  Exam:   General:  Pt in NAD, Alert and Awake  Cardiovascular: RRR, No MRG  Respiratory: CTA BL, no wheezes  Abdomen: soft, Nt, nd  Musculoskeletal: no clubbing, no active bleeding  Data Reviewed: Basic Metabolic Panel:  Recent Labs Lab 08/25/12 1240 08/26/12 0438  NA 138 139  K 3.4* 3.2*  CL 100 104  CO2 26 24  GLUCOSE 128* 136*  BUN 21 21  CREATININE 1.50* 1.52*  CALCIUM 8.8 8.4   Liver Function Tests: No results found for this basename: AST, ALT, ALKPHOS, BILITOT, PROT, ALBUMIN,  in the last 168  hours No results found for this basename: LIPASE, AMYLASE,  in the last 168 hours No results found for this basename: AMMONIA,  in the last 168 hours CBC:  Recent Labs Lab 08/25/12 1240 08/26/12 0438  WBC 6.9 4.7  NEUTROABS 5.3  --   HGB 9.9* 9.0*  HCT 29.0* 26.6*  MCV 87.3 87.5  PLT 178 171   Cardiac Enzymes: No results found for this basename: CKTOTAL, CKMB, CKMBINDEX, TROPONINI,  in the last 168 hours BNP (last 3 results) No results found for this basename: PROBNP,  in the last 8760 hours CBG: No results found for this basename: GLUCAP,  in the last 168 hours  Recent Results (from the past 240 hour(s))  SURGICAL PCR SCREEN     Status: Abnormal   Collection Time    08/26/12 11:01 AM      Result Value Range Status   MRSA, PCR POSITIVE (*) NEGATIVE Final   Comment: RESULT CALLED TO, READ BACK BY AND VERIFIED WITH:     [id number]   Staphylococcus aureus POSITIVE (*) NEGATIVE Final   Comment:            The Xpert SA Assay (FDA     approved for NASAL specimens     in patients over 21 years of age),     is one component of     a  comprehensive surveillance     program.  Test performance has     been validated by [facility] for patients greater     than or equal to 70 year old.     It is not intended     to diagnose infection nor to     guide or monitor treatment.     RESULT CALLED TO, READ BACK BY AND VERIFIED WITH:     [id number]     Studies: Dg Ribs Unilateral W/chest Right  08/25/2012   *RADIOLOGY REPORT*  Clinical Data: Fall, posterior rib pain  RIGHT RIBS AND CHEST - 3+ VIEW  Comparison: 02/07/2012  Findings: Five views right ribs submitted.  Cardiomediastinal silhouette is stable.  No acute infiltrate or pulmonary edema.  No right rib fracture is identified.  No diagnostic pneumothorax.  IMPRESSION: No rib fracture is identified.  No diagnostic pneumothorax.   Original Report Authenticated By: Natasha Mead, M.D.   Dg Hip  Complete Right  08/25/2012   *RADIOLOGY REPORT*  Clinical Data: Fall, right hip pain  RIGHT HIP - COMPLETE 2+ VIEW  Comparison: None.  Findings: Four views of the right hip submitted.  Study is limited by diffuse osteopenia.  There is mild displaced impacted fracture of the right femoral neck .Old fracture deformity of the mid femoral shaft.  IMPRESSION: Mild displaced fracture of the right femoral neck.  Diffuse osteopenia.  Per CMS PQRS reporting requirements (PQRS Measure 24): Given the patient's age of greater than 50 and the fracture site (hip, distal radius, or spine), the patient should be tested for osteoporosis using DXA, and the appropriate treatment considered based on the DXA results.   Original Report Authenticated By: Natasha Mead, M.D.   Dg Knee 2 Views Right  08/25/2012   *RADIOLOGY REPORT*  Clinical Data: Fall  RIGHT KNEE - 1-2 VIEW  Comparison: None.  Findings: Two views of the right knee submitted.  No acute fracture or subluxation.  There is diffuse osteopenia.  IMPRESSION: No acute fracture or subluxation.  Diffuse osteopenia.   Original Report Authenticated By: Natasha Mead, M.D.   Dg Pelvis Portable  08/26/2012   *RADIOLOGY REPORT*  Clinical Data: Postoperative studies status post right hip replacement.  PORTABLE PELVIS  Comparison: 08/25/2012.  Findings: There has been interval placement of a right-sided hip hemiarthroplasty.  The femoral stem of the prosthesis appears properly seated without definite periprosthetic fracture or other acute complicating features.  The prosthetic femoral head projects over the acetabulum.  Gas is present in the overlying joint space and adjacent soft tissues.  Surgical staples is seen lateral to the joint space.  An old healed fracture of the proximal third of the right femoral diaphysis is incidentally noted.  IMPRESSION: 1.  Postoperative changes of right-sided hip arthroplasty without acute complicating features, as above.   Original Report Authenticated  By: Trudie Reed, M.D.   Dg Chest Port 1 View  08/25/2012   *RADIOLOGY REPORT*  Clinical Data: Preop, fall  PORTABLE CHEST - 1 VIEW  Comparison: 02/07/2012  Findings: Cardiomediastinal silhouette is stable.  No acute infiltrate or pulmonary edema.  No diagnostic pneumothorax.  IMPRESSION: No active disease.  No diagnostic pneumothorax.   Original Report Authenticated By: Natasha Mead, M.D.    Scheduled Meds: . enoxaparin (LOVENOX) injection  40 mg Subcutaneous Q24H  . finasteride  5 mg Oral QHS  . sertraline  100 mg Oral Daily  . tamsulosin  0.4 mg Oral QPC supper   Continuous  Infusions: . dextrose 5 % and 0.9% NaCl 50 mL/hr at 08/25/12 1700    Principal Problem:   Fracture of right hip Active Problems:   Hyponatremia   Renal failure, unspecified   Weakness   Anemia   Lung mass    Time spent: > 35 minutes    [name]  [facility] Pager [phone] If 7PM-7AM, please contact night-coverage at [email or web address], password [facility] 08/26/2012, 4:29 PM  LOS: 1 day

## 2012-08-26 NOTE — Progress Notes (Signed)
X-ray results noted 

## 2012-08-26 NOTE — Interval H&P Note (Signed)
History and Physical Interval Note:  08/26/2012 11:53 AM  Russell [name]  has presented today for surgery, with the diagnosis of right femoral neck fracture  The various methods of treatment have been discussed with the patient and family. After consideration of risks, benefits and other options for treatment, the patient has consented to  Procedure(s): RIGHT HEMI HIP ARTHROPLASTY  (Right) as a surgical intervention .  The patient's history has been reviewed, patient examined, no change in status, stable for surgery.  I have reviewed the patient's chart and labs.  Questions were answered to the patient's satisfaction.     Jeffery Gammell G

## 2012-08-26 NOTE — Progress Notes (Signed)
Clinical Social Work Department CLINICAL SOCIAL WORK PLACEMENT NOTE 08/26/2012  Patient:  Russell [name], Russell [name]  Account Number:  [id number] Admit date:  08/25/2012  Clinical Social Worker:  Cori Razor, LCSW  Date/time:  08/26/2012 12:50 PM  Clinical Social Work is seeking post-discharge placement for this patient at the following level of care:   SKILLED NURSING   (*CSW will update this form in Epic as items are completed)     Patient/family provided with [facility] Department of Clinical Social Work's list of facilities offering this level of care within the geographic area requested by the patient (or if unable, by the patient's family).    Patient/family informed of their freedom to choose among providers that offer the needed level of care, that participate in Medicare, Medicaid or managed care program needed by the patient, have an available bed and are willing to accept the patient.    Patient/family informed of [facility]' ownership interest in [facility], as well as of the fact that they are under no obligation to receive care at this facility.  PASARR submitted to EDS on 08/26/2012 PASARR number received from EDS on   FL2 transmitted to all facilities in geographic area requested by pt/family on  08/26/2012 FL2 transmitted to all facilities within larger geographic area on   Patient informed that his/her managed care company has contracts with or will negotiate with  certain facilities, including the following:     Patient/family informed of bed offers received:   Patient chooses bed at  Physician recommends and patient chooses bed at    Patient to be transferred to  on   Patient to be transferred to facility by   The following physician request were entered in Epic:   Additional Comments:  Cori Razor LCSW [phone]

## 2012-08-26 NOTE — H&P (View-Only) (Signed)
Reason for Consult:   Hip fracture Referring Physician:    EDP  Russell [name] is an 79 y.o. male  NHR who is a poor historian and beset with some long term psych issues. Could not get OOB today and was taken to ED where xrays showed femoral neck fracture.  Admitted by medical team through hip fracture pathway.  Difficult to communicate with but denies pain elsewhere.    Past Medical History  Diagnosis Date  . Stroke   . Acute bronchitis   . Hypertension     Past Surgical History  Procedure Laterality Date  . Hernia repair    . Lumbar disc surgery      Family History  Problem Relation Age of Onset  . CVA Mother     Social History:  reports that he has never smoked. He has never used smokeless tobacco. He reports that he does not drink alcohol or use illicit drugs.  Allergies:  Allergies  Allergen Reactions  . Ambien (Zolpidem Tartrate) Other (See Comments)    Unknown reaction  . Penicillins Other (See Comments)    Unknown reaction    Medications: I have reviewed the patient's current medications.  Results for orders placed during the hospital encounter of 08/25/12 (from the past 48 hour(s))  CBC WITH DIFFERENTIAL     Status: Abnormal   Collection Time    08/25/12 12:40 PM      Result Value Range   WBC 6.9  4.0 - 10.5 K/uL   RBC 3.32 (*) 4.22 - 5.81 MIL/uL   Hemoglobin 9.9 (*) 13.0 - 17.0 g/dL   HCT 16.1 (*) 09.6 - 04.5 %   MCV 87.3  78.0 - 100.0 fL   MCH 29.8  26.0 - 34.0 pg   MCHC 34.1  30.0 - 36.0 g/dL   RDW 40.9  81.1 - 91.4 %   Platelets 178  150 - 400 K/uL   Neutrophils Relative % 77  43 - 77 %   Neutro Abs 5.3  1.7 - 7.7 K/uL   Lymphocytes Relative 12  12 - 46 %   Lymphs Abs 0.8  0.7 - 4.0 K/uL   Monocytes Relative 8  3 - 12 %   Monocytes Absolute 0.6  0.1 - 1.0 K/uL   Eosinophils Relative 2  0 - 5 %   Eosinophils Absolute 0.1  0.0 - 0.7 K/uL   Basophils Relative 0  0 - 1 %   Basophils Absolute 0.0  0.0 - 0.1 K/uL  BASIC METABOLIC PANEL     Status:  Abnormal   Collection Time    08/25/12 12:40 PM      Result Value Range   Sodium 138  135 - 145 mEq/L   Potassium 3.4 (*) 3.5 - 5.1 mEq/L   Chloride 100  96 - 112 mEq/L   CO2 26  19 - 32 mEq/L   Glucose, Bld 128 (*) 70 - 99 mg/dL   BUN 21  6 - 23 mg/dL   Creatinine, Ser 7.82 (*) 0.50 - 1.35 mg/dL   Calcium 8.8  8.4 - 95.6 mg/dL   GFR calc non Af Amer 38 (*) >90 mL/min   GFR calc Af Amer 44 (*) >90 mL/min   Comment:            The eGFR has been calculated     using the CKD EPI equation.     This calculation has not been     validated in all clinical  situations.     eGFR's persistently     <90 mL/min signify     possible Chronic Kidney Disease.  PROTIME-INR     Status: None   Collection Time    08/25/12 12:40 PM      Result Value Range   Prothrombin Time 14.2  11.6 - 15.2 seconds   INR 1.11  0.00 - 1.49    Dg Ribs Unilateral W/chest Right  08/25/2012   *RADIOLOGY REPORT*  Clinical Data: Fall, posterior rib pain  RIGHT RIBS AND CHEST - 3+ VIEW  Comparison: 02/07/2012  Findings: Five views right ribs submitted.  Cardiomediastinal silhouette is stable.  No acute infiltrate or pulmonary edema.  No right rib fracture is identified.  No diagnostic pneumothorax.  IMPRESSION: No rib fracture is identified.  No diagnostic pneumothorax.   Original Report Authenticated By: Natasha Mead, M.D.   Dg Hip Complete Right  08/25/2012   *RADIOLOGY REPORT*  Clinical Data: Fall, right hip pain  RIGHT HIP - COMPLETE 2+ VIEW  Comparison: None.  Findings: Four views of the right hip submitted.  Study is limited by diffuse osteopenia.  There is mild displaced impacted fracture of the right femoral neck .Old fracture deformity of the mid femoral shaft.  IMPRESSION: Mild displaced fracture of the right femoral neck.  Diffuse osteopenia.  Per CMS PQRS reporting requirements (PQRS Measure 24): Given the patient's age of greater than 50 and the fracture site (hip, distal radius, or spine), the patient should be  tested for osteoporosis using DXA, and the appropriate treatment considered based on the DXA results.   Original Report Authenticated By: Natasha Mead, M.D.   Dg Knee 2 Views Right  08/25/2012   *RADIOLOGY REPORT*  Clinical Data: Fall  RIGHT KNEE - 1-2 VIEW  Comparison: None.  Findings: Two views of the right knee submitted.  No acute fracture or subluxation.  There is diffuse osteopenia.  IMPRESSION: No acute fracture or subluxation.  Diffuse osteopenia.   Original Report Authenticated By: Natasha Mead, M.D.   Dg Chest Port 1 View  08/25/2012   *RADIOLOGY REPORT*  Clinical Data: Preop, fall  PORTABLE CHEST - 1 VIEW  Comparison: 02/07/2012  Findings: Cardiomediastinal silhouette is stable.  No acute infiltrate or pulmonary edema.  No diagnostic pneumothorax.  IMPRESSION: No active disease.  No diagnostic pneumothorax.   Original Report Authenticated By: Natasha Mead, M.D.    @ROS @ Blood pressure 138/79, pulse 66, temperature 98.1 F (36.7 C), temperature source Oral, resp. rate 20, SpO2 94.00%.  PHYSICAL EXAM:   ABD soft Neurovascular intact Sensation intact distally Intact pulses distally Dorsiflexion/Plantar flexion intact Compartment soft  Right leg SER and painful to rotation Other leg and both arms move well   ASSESSMENT:   Right femoral neck fracture with previous femur fracture  PLAN:   Needs hemi to sit and hopefully stand again.  Will be a little difficult logistically due to mental issues and physically due to his old displaced femur fracture.  Can overcome physical issue with cemented short stem.  Plan surgery for tomorrow about 1pm.  Plan on ASA bid times four weeks for anticoagulation.  With frequent falls I do not think coumadin would be good idea. Hopefully back to [facility] postop assuming they offer correct level of care.  Dazhane Villagomez G 08/25/2012, 6:33 PM

## 2012-08-26 NOTE — Progress Notes (Signed)
Portable AP Pelvis X-ray done. 

## 2012-08-26 NOTE — Progress Notes (Signed)
PT Cancellation Note  Patient Details Name: Russell [name] MRN: [id number] DOB: 1916/05/11   Cancelled Treatment:    Reason Eval/Treat Not Completed: Patient not medically ready Pt with planned hip surgery today due to R hip fx.  Please re-order therapy after surgery if pt appropriate. Thanks.   Rica Heather,KATHrine E 08/26/2012, 8:24 AM Pager: [phone]

## 2012-08-26 NOTE — Transfer of Care (Signed)
Immediate Anesthesia Transfer of Care Note  Patient: Russell [name]  Procedure(s) Performed: Procedure(s): RIGHT HEMI HIP ARTHROPLASTY  (Right)  Patient Location: PACU  Anesthesia Type:Regional and Spinal  Level of Consciousness: awake, sedated and patient cooperative  Airway & Oxygen Therapy: Patient Spontanous Breathing and Patient connected to face mask oxygen  Post-op Assessment: Report given to PACU RN and Post -op Vital signs reviewed and stable  Post vital signs: Reviewed and stable  Complications: No apparent anesthesia complications

## 2012-08-26 NOTE — Anesthesia Preprocedure Evaluation (Addendum)
Anesthesia Evaluation  Patient identified by MRN, date of birth, ID band Patient awake and Patient confused    Reviewed: Allergy & Precautions, H&P , NPO status , Patient's Chart, lab work & pertinent test results  Airway Mallampati: II TM Distance: >3 FB Neck ROM: Full    Dental no notable dental hx.    Pulmonary neg pulmonary ROS,  breath sounds clear to auscultation  Pulmonary exam normal       Cardiovascular hypertension, Pt. on medications negative cardio ROS  Rhythm:Regular Rate:Normal     Neuro/Psych CVA negative neurological ROS  negative psych ROS   GI/Hepatic negative GI ROS, Neg liver ROS,   Endo/Other  negative endocrine ROS  Renal/GU negative Renal ROS  negative genitourinary   Musculoskeletal negative musculoskeletal ROS (+)   Abdominal   Peds negative pediatric ROS (+)  Hematology negative hematology ROS (+) Blood dyscrasia, anemia ,   Anesthesia Other Findings   Reproductive/Obstetrics negative OB ROS                          Anesthesia Physical Anesthesia Plan  ASA: II  Anesthesia Plan: Spinal   Post-op Pain Management:    Induction:   Airway Management Planned: Simple Face Mask  Additional Equipment:   Intra-op Plan:   Post-operative Plan:   Informed Consent: I have reviewed the patients History and Physical, chart, labs and discussed the procedure including the risks, benefits and alternatives for the proposed anesthesia with the patient or authorized representative who has indicated his/her understanding and acceptance.   Dental advisory given  Plan Discussed with: CRNA  Anesthesia Plan Comments:         Anesthesia Quick Evaluation
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
## 2012-08-27 ENCOUNTER — Encounter (HOSPITAL_COMMUNITY): Payer: Self-pay | Admitting: Orthopaedic Surgery

## 2012-08-27 DIAGNOSIS — N19 Unspecified kidney failure: Secondary | ICD-10-CM

## 2012-08-27 LAB — CBC
Hemoglobin: 7.7 g/dL — ABNORMAL LOW (ref 13.0–17.0)
MCV: 88.5 fL (ref 78.0–100.0)
Platelets: 135 10*3/uL — ABNORMAL LOW (ref 150–400)
RBC: 2.6 MIL/uL — ABNORMAL LOW (ref 4.22–5.81)
WBC: 6.2 10*3/uL (ref 4.0–10.5)

## 2012-08-27 LAB — IRON AND TIBC: Iron: <10 ug/dL — ABNORMAL LOW (ref 42–135)

## 2012-08-27 LAB — BASIC METABOLIC PANEL
CO2: 23 mEq/L (ref 19–32)
Calcium: 8.1 mg/dL — ABNORMAL LOW (ref 8.4–10.5)
Creatinine, Ser: 1.44 mg/dL — ABNORMAL HIGH (ref 0.50–1.35)
GFR calc non Af Amer: 40 mL/min — ABNORMAL LOW (ref >90)
Sodium: 138 mEq/L (ref 135–145)

## 2012-08-27 LAB — FERRITIN: Ferritin: 1168 ng/mL — ABNORMAL HIGH (ref 22–322)

## 2012-08-27 MED ORDER — MUPIROCIN 2 % EX OINT
1.0000 | TOPICAL_OINTMENT | Freq: Two times a day (BID) | CUTANEOUS | Status: DC
Start: 2012-08-27 — End: 2012-08-29
  Administered 2012-08-28 – 2012-08-29 (×2): 1 via NASAL
  Filled 2012-08-27: qty 22

## 2012-08-27 MED ORDER — METHOCARBAMOL 100 MG/ML IJ SOLN
250.0000 mg | Freq: Four times a day (QID) | INTRAVENOUS | Status: DC | PRN
  Administered 2012-08-27: 250 mg via INTRAVENOUS
  Filled 2012-08-27: qty 2.5

## 2012-08-27 MED ORDER — CHLORHEXIDINE GLUCONATE CLOTH 2 % EX PADS
6.0000 | MEDICATED_PAD | Freq: Every day | CUTANEOUS | Status: DC
  Administered 2012-08-29: 6 via TOPICAL

## 2012-08-27 MED ORDER — POTASSIUM CHLORIDE 2 MEQ/ML IV SOLN
INTRAVENOUS | Status: DC
  Administered 2012-08-27 – 2012-08-28 (×2): via INTRAVENOUS
  Filled 2012-08-27 (×4): qty 1000

## 2012-08-27 MED ORDER — POTASSIUM CHLORIDE CRYS ER 20 MEQ PO TBCR
40.0000 meq | EXTENDED_RELEASE_TABLET | Freq: Two times a day (BID) | ORAL | Status: AC
  Administered 2012-08-27 – 2012-08-28 (×4): 40 meq via ORAL
  Filled 2012-08-27 (×4): qty 2

## 2012-08-27 MED FILL — Bupivacaine HCl Preservative Free (PF) Inj 0.5%: INTRAMUSCULAR | Qty: 30 | Status: AC

## 2012-08-27 NOTE — Progress Notes (Signed)
CSW assisting with d/c planning. SNF bed offers provided to pt's son. He will review options and contact CSW with his SNF choice.  Cori Razor LCSW [phone]

## 2012-08-27 NOTE — Progress Notes (Signed)
Subjective: 1 Day Post-Op Procedure(s) (LRB): RIGHT HEMI HIP ARTHROPLASTY  (Right)  Activity level:  bedrest Diet tolerance:  fair Voiding:  foley Patient reports pain as moderate.    Objective: Vital signs in last 24 hours: Temp:  [97.4 F (36.3 C)-99.1 F (37.3 C)] 99.1 F (37.3 C) (05/21 0550) Pulse Rate:  [62-78] 76 (05/21 0550) Resp:  [12-20] 16 (05/21 0550) BP: (109-158)/(58-82) 129/58 mmHg (05/21 0550) SpO2:  [96 %-100 %] 96 % (05/21 0550)  Labs:  Recent Labs  08/25/12 1240 08/26/12 0438 08/27/12 0448  HGB 9.9* 9.0* 7.7*    Recent Labs  08/26/12 0438 08/27/12 0448  WBC 4.7 6.2  RBC 3.04* 2.60*  HCT 26.6* 23.0*  PLT 171 135*    Recent Labs  08/26/12 0438 08/27/12 0448  NA 139 138  K 3.2* 3.0*  CL 104 103  CO2 24 23  BUN 21 20  CREATININE 1.52* 1.44*  GLUCOSE 136* 164*  CALCIUM 8.4 8.1*    Recent Labs  08/25/12 1240  INR 1.11    Physical Exam:  ABD soft Neurovascular intact Sensation intact distally Intact pulses distally Dorsiflexion/Plantar flexion intact Compartment soft  Assessment/Plan:  1 Day Post-Op Procedure(s) (LRB): RIGHT HEMI HIP ARTHROPLASTY  (Right) Advance diet Up with therapy Discharge to SNF eventually  Think he is having pain related to spasm more than true pain and would use relaxer like Valium a bit Transfusion probably a good idea Would supplement his potassium May WBAT with PT and needs knee immobilizer only while in bed ASA bid for 4 weeks for DVT prophylaxis if possible    Russell [name] G 08/27/2012, 6:22 AM

## 2012-08-27 NOTE — Progress Notes (Signed)
[facility] PROGRESS NOTE  Russell [name] ZOX:[id number] DOB: [birth year] DOA: 08/25/2012 PCP: No primary provider on file.  Assessment/Plan: 1. Right femoral neck fracture - Management per Ortho.  Will defer pain medication to them and recommendations on dressing and wound care.  2. Anemia - Continue to monitor -  Hemoglobin has dropped from 9.0 to 7.7, he is asymptomatic. Will check CBC in am.  3. Lung mass - No report on recent chest x ray - patient to follow up as outpatient with his pcp for continued evaluation and recommendations.   4. 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 BPH - Pt on Flomax  6 Mild Confusion - at baseline, called and discussed with son who is retired hospitalist, and as per him the patient gets confused under stress.   7 Hypokalemia - will replace potassium and check bmp in am.  Code Status: full Family Communication:  Called and discussed with son on phone Disposition Plan: Pending continued improvement in condition   Consultants:  Ortho  Procedures:  Right hip hemiarthroplasty  Antibiotics:  None  HPI/Subjective: Patient has no new complaints. Pleasantly confused.   Objective: Filed Vitals:   08/27/12 0035 08/27/12 0400 08/27/12 0550 08/27/12 0756  BP: 114/61  129/58   Pulse: 66  76   Temp: 98.1 F (36.7 C)  99.1 F (37.3 C)   TempSrc: Oral  Oral   Resp: 16 16 16 16   SpO2: 98% 96% 96% 96%    Intake/Output Summary (Last 24 hours) at 08/27/12 0911 Last data filed at 08/27/12 8119  Gross per 24 hour  Intake 2792.5 ml  Output    444 ml  Net 2348.5 ml   There were no vitals filed for this visit.  Exam:   General:  Pt in NAD, Alert and Awake, pleasantly confused  Cardiovascular: s1s2 RRR,   Respiratory: CTA BL, no wheezes  Abdomen: soft, non tender, no organomegaly  Musculoskeletal: no clubbing, no active bleeding  Data Reviewed: Basic Metabolic  Panel:  Recent Labs Lab 08/25/12 1240 08/26/12 0438 08/27/12 0448  NA 138 139 138  K 3.4* 3.2* 3.0*  CL 100 104 103  CO2 26 24 23   GLUCOSE 128* 136* 164*  BUN 21 21 20   CREATININE 1.50* 1.52* 1.44*  CALCIUM 8.8 8.4 8.1*   Liver Function Tests: No results found for this basename: AST, ALT, ALKPHOS, BILITOT, PROT, ALBUMIN,  in the last 168 hours No results found for this basename: LIPASE, AMYLASE,  in the last 168 hours No results found for this basename: AMMONIA,  in the last 168 hours CBC:  Recent Labs Lab 08/25/12 1240 08/26/12 0438 08/27/12 0448  WBC 6.9 4.7 6.2  NEUTROABS 5.3  --   --   HGB 9.9* 9.0* 7.7*  HCT 29.0* 26.6* 23.0*  MCV 87.3 87.5 88.5  PLT 178 171 135*   Cardiac Enzymes: No results found for this basename: CKTOTAL, CKMB, CKMBINDEX, TROPONINI,  in the last 168 hours BNP (last 3 results) No results found for this basename: PROBNP,  in the last 8760 hours CBG: No results found for this basename: GLUCAP,  in the last 168 hours  Recent Results (from the past 240 hour(s))  SURGICAL PCR SCREEN     Status: Abnormal   Collection Time    08/26/12 11:01 AM      Result Value Range Status   MRSA, PCR POSITIVE (*) NEGATIVE Final   Comment: RESULT CALLED TO, READ  BACK BY AND VERIFIED WITH:     [id number]   Staphylococcus aureus POSITIVE (*) NEGATIVE Final   Comment:            The Xpert SA Assay (FDA     approved for NASAL specimens     in patients over 77 years of age),     is one component of     a comprehensive surveillance     program.  Test performance has     been validated by [employer] for patients greater     than or equal to 27 year old.     It is not intended     to diagnose infection nor to     guide or monitor treatment.     RESULT CALLED TO, READ BACK BY AND VERIFIED WITH:     HOLLWAYK/[id number]/[email or web address]     Studies: Dg Ribs Unilateral W/chest Right  08/25/2012   *RADIOLOGY REPORT*  Clinical Data: Fall,  posterior rib pain  RIGHT RIBS AND CHEST - 3+ VIEW  Comparison: 02/07/2012  Findings: Five views right ribs submitted.  Cardiomediastinal silhouette is stable.  No acute infiltrate or pulmonary edema.  No right rib fracture is identified.  No diagnostic pneumothorax.  IMPRESSION: No rib fracture is identified.  No diagnostic pneumothorax.   Original Report Authenticated By: Natasha Mead, M.D.   Dg Hip Complete Right  08/25/2012   *RADIOLOGY REPORT*  Clinical Data: Fall, right hip pain  RIGHT HIP - COMPLETE 2+ VIEW  Comparison: None.  Findings: Four views of the right hip submitted.  Study is limited by diffuse osteopenia.  There is mild displaced impacted fracture of the right femoral neck .Old fracture deformity of the mid femoral shaft.  IMPRESSION: Mild displaced fracture of the right femoral neck.  Diffuse osteopenia.  Per CMS PQRS reporting requirements (PQRS Measure 24): Given the patient's age of greater than 50 and the fracture site (hip, distal radius, or spine), the patient should be tested for osteoporosis using DXA, and the appropriate treatment considered based on the DXA results.   Original Report Authenticated By: Natasha Mead, M.D.   Dg Knee 2 Views Right  08/25/2012   *RADIOLOGY REPORT*  Clinical Data: Fall  RIGHT KNEE - 1-2 VIEW  Comparison: None.  Findings: Two views of the right knee submitted.  No acute fracture or subluxation.  There is diffuse osteopenia.  IMPRESSION: No acute fracture or subluxation.  Diffuse osteopenia.   Original Report Authenticated By: Natasha Mead, M.D.   Dg Pelvis Portable  08/26/2012   *RADIOLOGY REPORT*  Clinical Data: Postoperative studies status post right hip replacement.  PORTABLE PELVIS  Comparison: 08/25/2012.  Findings: There has been interval placement of a right-sided hip hemiarthroplasty.  The femoral stem of the prosthesis appears properly seated without definite periprosthetic fracture or other acute complicating features.  The prosthetic femoral head  projects over the acetabulum.  Gas is present in the overlying joint space and adjacent soft tissues.  Surgical staples is seen lateral to the joint space.  An old healed fracture of the proximal third of the right femoral diaphysis is incidentally noted.  IMPRESSION: 1.  Postoperative changes of right-sided hip arthroplasty without acute complicating features, as above.   Original Report Authenticated By: Trudie Reed, M.D.   Dg Chest Port 1 View  08/25/2012   *RADIOLOGY REPORT*  Clinical Data: Preop, fall  PORTABLE CHEST - 1 VIEW  Comparison: 02/07/2012  Findings: Cardiomediastinal silhouette is stable.  No  acute infiltrate or pulmonary edema.  No diagnostic pneumothorax.  IMPRESSION: No active disease.  No diagnostic pneumothorax.   Original Report Authenticated By: Natasha Mead, M.D.    Scheduled Meds: . aspirin EC  325 mg Oral BID  . finasteride  5 mg Oral QHS  . potassium chloride  40 mEq Oral BID  . sertraline  100 mg Oral Daily  . tamsulosin  0.4 mg Oral QPC supper   Continuous Infusions: . dextrose 5 % and 0.45% NaCl 1,000 mL with potassium chloride 20 mEq infusion      Principal Problem:   Fracture of right hip Active Problems:   Renal failure, unspecified   Weakness   Anemia   Lung mass    Time spent: > 35 minutes    LAMA,GAGAN S  [facility] Pager [phone] If 7PM-7AM, please contact night-coverage at [email or web address], password [facility] 08/27/2012, 9:11 AM  LOS: 2 days

## 2012-08-27 NOTE — Progress Notes (Signed)
OT Cancellation Note  Patient Details Name: Russell [name] MRN: [id number] DOB: [birth year]   Cancelled Treatment:     Noted pt is going to SNF for rehab.  Will defer OT eval to that venue.    Russell [name] 08/27/2012, 1:47 PM Marica Otter, OTR/L [phone] 08/27/2012

## 2012-08-27 NOTE — Evaluation (Signed)
Physical Therapy Evaluation Patient Details Name: Russell [name] MRN: [id number] DOB: 08/03/1916 Today's Date: 08/27/2012 Time: 4098-1191 PT Time Calculation (min): 22 min  PT Assessment / Plan / Recommendation Clinical Impression  Pt s/p R hip fx with hemi-arthroplasty presents with decreased R LE strength/ROM , post hip precautions, post op pain and premorbid cognitive deficits limiting functional mobility    PT Assessment  Patient needs continued PT services    Follow Up Recommendations  SNF    Does the patient have the potential to tolerate intense rehabilitation      Barriers to Discharge        Equipment Recommendations  None recommended by PT    Recommendations for Other Services OT consult   Frequency Min 3X/week    Precautions / Restrictions Precautions Precautions: Fall;Posterior Hip Precaution Comments: sign hung in room Required Braces or Orthoses: Knee Immobilizer - Right Knee Immobilizer - Right: Other (comment) (on in bed) Restrictions Weight Bearing Restrictions: No Other Position/Activity Restrictions: WBAT   Pertinent Vitals/Pain Pt unable to rate pain      Mobility  Bed Mobility Bed Mobility: Supine to Sit Supine to Sit: 1: +2 Total assist Supine to Sit: Patient Percentage: 20% Details for Bed Mobility Assistance: pt assisting self with rail Transfers Transfers: Sit to Stand;Stand to Sit Sit to Stand: 1: +2 Total assist Sit to Stand: Patient Percentage: 20% Stand to Sit: 1: +2 Total assist Stand to Sit: Patient Percentage: 20% Details for Transfer Assistance: cues for LE management and use of UEs - pt with difficulty focussing on and following all cues Ambulation/Gait Ambulation/Gait Assistance: 1: +2 Total assist Ambulation/Gait: Patient Percentage: 20% Ambulation Distance (Feet): 2 Feet Assistive device: Rolling walker Ambulation/Gait Assistance Details: Pt cued for posture, sequence, postion from RW but wtih difficulty following all  cues Gait Pattern: Step-to pattern;Ataxic;Antalgic;Shuffle;Narrow base of support;Trunk flexed    Exercises     PT Diagnosis: Difficulty walking  PT Problem List: Decreased strength;Decreased range of motion;Decreased activity tolerance;Decreased mobility;Decreased knowledge of use of DME;Pain;Decreased knowledge of precautions;Decreased cognition PT Treatment Interventions: DME instruction;Gait training;Functional mobility training;Therapeutic activities;Therapeutic exercise;Patient/family education   PT Goals Acute Rehab PT Goals PT Goal Formulation: Patient unable to participate in goal setting Time For Goal Achievement: 09/04/12 Potential to Achieve Goals: Fair Pt will go Supine/Side to Sit: with mod assist PT Goal: Supine/Side to Sit - Progress: Goal set today Pt will go Sit to Supine/Side: with mod assist PT Goal: Sit to Supine/Side - Progress: Goal set today Pt will go Sit to Stand: with mod assist PT Goal: Sit to Stand - Progress: Goal set today Pt will go Stand to Sit: with mod assist PT Goal: Stand to Sit - Progress: Goal set today Pt will Ambulate: 1 - 15 feet;with +2 total assist;with rolling walker PT Goal: Ambulate - Progress: Goal set today  Visit Information  Last PT Received On: 08/27/12 Assistance Needed: +2    Subjective Data  Subjective: I think I should sit over there for my lunch, can you get me another menu? Patient Stated Goal: Pt unable to formulate goals   Prior Functioning  Home Living Lives With: Other (Comment) (SNF resident) Prior Function Level of Independence: Needs assistance Comments: pt unable to provide information  Communication Communication: Other (comment) (dementia)    Cognition  Cognition Arousal/Alertness: Awake/alert Behavior During Therapy: WFL for tasks assessed/performed (pleasantly confused) Overall Cognitive Status: History of cognitive impairments - at baseline    Extremity/Trunk Assessment Right Upper Extremity  Assessment RUE ROM/Strength/Tone:  WFL for tasks assessed Left Upper Extremity Assessment LUE ROM/Strength/Tone: [facility] for tasks assessed Right Lower Extremity Assessment RLE ROM/Strength/Tone: Deficits Left Lower Extremity Assessment LLE ROM/Strength/Tone: WFL for tasks assessed   Balance    End of Session PT - End of Session Equipment Utilized During Treatment: Gait belt Activity Tolerance: Patient limited by pain;Patient limited by fatigue;Other (comment) (cognition) Patient left: in chair;with call bell/phone within reach Nurse Communication: Mobility status  GP     Russell [name] 08/27/2012, 1:19 PM

## 2012-08-27 NOTE — Progress Notes (Signed)
Physical Therapy Treatment Patient Details Name: Russell [name] MRN: [id number] DOB: July 11, 1916 Today's Date: 08/27/2012 Time: 4098-1191 PT Time Calculation (min): 20 min  PT Assessment / Plan / Recommendation Comments on Treatment Session       Follow Up Recommendations  SNF     Does the patient have the potential to tolerate intense rehabilitation     Barriers to Discharge        Equipment Recommendations  None recommended by PT    Recommendations for Other Services OT consult  Frequency Min 3X/week   Plan      Precautions / Restrictions Precautions Precautions: Fall;Posterior Hip Precaution Comments: pt currently unable to recall any THP 2* cognitive status Required Braces or Orthoses: Knee Immobilizer - Right Knee Immobilizer - Right: Other (comment) Restrictions Weight Bearing Restrictions: No Other Position/Activity Restrictions: WBAT   Pertinent Vitals/Pain Pt indicates discomfort with WB and attempts to position L LE in bed but unable to rate pain    Mobility  Bed Mobility Bed Mobility: Sit to Supine Sit to Supine: 1: +2 Total assist Sit to Supine: Patient Percentage: 20% Details for Bed Mobility Assistance: pt with difficulty following cues but intermittantly assisting when tasks physically initiated for him Transfers Transfers: Sit to Stand;Stand to Sit Sit to Stand: 1: +2 Total assist Sit to Stand: Patient Percentage: 30% Stand to Sit: 1: +2 Total assist Stand to Sit: Patient Percentage: 20% Details for Transfer Assistance: cues for LE management and use of UEs - pt with difficulty focussing on and following all cues Ambulation/Gait Ambulation/Gait Assistance: 1: +2 Total assist Ambulation/Gait: Patient Percentage: 40% Ambulation Distance (Feet): 1 Feet Assistive device: Rolling walker Ambulation/Gait Assistance Details: Pt able turn using RW and physical cues but unable to comprehend stepping frward/backwared.   Bed pulled behind pt to sit down Gait  Pattern: Step-to pattern;Ataxic;Antalgic;Shuffle;Narrow base of support;Trunk flexed    Exercises     PT Diagnosis:    PT Problem List:   PT Treatment Interventions:     PT Goals Acute Rehab PT Goals PT Goal Formulation: Patient unable to participate in goal setting Time For Goal Achievement: 09/04/12 Potential to Achieve Goals: Fair Pt will go Supine/Side to Sit: with mod assist PT Goal: Supine/Side to Sit - Progress: Goal set today Pt will go Sit to Supine/Side: with mod assist PT Goal: Sit to Supine/Side - Progress: Goal set today Pt will go Sit to Stand: with mod assist PT Goal: Sit to Stand - Progress: Goal set today Pt will go Stand to Sit: with mod assist PT Goal: Stand to Sit - Progress: Goal set today Pt will Ambulate: 1 - 15 feet;with +2 total assist;with rolling walker PT Goal: Ambulate - Progress: Goal set today  Visit Information  Last PT Received On: 08/27/12 Assistance Needed: +2    Subjective Data  Subjective: Now don't move that tray over there Patient Stated Goal: Pt unable to formulate goals   Cognition  Cognition Arousal/Alertness: Awake/alert Behavior During Therapy: WFL for tasks assessed/performed Overall Cognitive Status: History of cognitive impairments - at baseline    Balance     End of Session PT - End of Session Equipment Utilized During Treatment: Gait belt Activity Tolerance: Patient limited by pain;Patient limited by fatigue;Other (comment) (cognition) Patient left: in bed;with call bell/phone within reach;with nursing in room Nurse Communication: Mobility status   GP     Russell [name] 08/27/2012, 4:05 PM

## 2012-08-27 NOTE — Op Note (Signed)
NAME:  Russell [name], Russell [name] NO.:  [id number]  MEDICAL RECORD NO.:  [id number]  LOCATION:                                 FACILITY:  PHYSICIAN:  Lubertha Basque. Suleyma Wafer, M.D.DATE OF BIRTH:  October 06, 1916  DATE OF PROCEDURE:  08/26/2012 DATE OF DISCHARGE:                              OPERATIVE REPORT   PREOPERATIVE DIAGNOSIS:  Right hip femoral neck fracture.  POSTOPERATIVE DIAGNOSIS:  Right hip femoral neck fracture.  PROCEDURE:  Right hip hemiarthroplasty.  ANESTHESIA:  Spinal.  ATTENDING SURGEON:  Lubertha Basque. Jerl Santos, M.D.  ASSISTANT:  Lindwood Qua, PA-C  INDICATION FOR PROCEDURE:  The patient is a 79 year old man who unfortunately fell yesterday.  He is unable to stand or sit, and was taken to the emergency room, where x-rays confirmed a displaced femoral neck fracture.  He was admitted to the Medicine Service and stabilized and offered hemiarthroplasty in hopes of allowing him to sit, potentially stand and walk again.  Informed operative consent was obtained after discussion of possible complications including reaction to anesthesia, infection, DVT, PE, dislocation, and death.  The case was a little bit difficult due to a previous history of a femur fracture treated in a closed fashion, probably with traction many years ago.  SUMMARY OF FINDINGS AND PROCEDURE:  The patient was taken to an operating suite, where under spinal anesthesia underwent a right hip hemiarthroplasty.  He had a displaced fracture and fairly good bone quality.  We were able to place a cemented size 3 Summit basic stem without going past the fracture site.  We placed this in slight anteversion and capped it with a +0 53 unipolar hip ball.  Lindwood Qua assisted throughout and was invaluable to the completion of the case and that he helped maintain retraction and exposure and aided in reduction of the hip.  He also closed simultaneously to help minimize OR time.  DESCRIPTION OF  PROCEDURE:  The patient was taken to the operating suite, where spinal anesthetic was applied without difficulty.  He was positioned in the lateral decubitus position with the right hip up. Axillary roll was placed and the bony prominences were padded.  Hip positioners were utilized.  He was then prepped and draped in normal sterile fashion.  After the administration of IV clindamycin and an appropriate time out, a posterior approach was taken to the right hip. He was fairly thin and this was a simple approach.  We made a skin incision, with dissection down to the ITB and gluteus maximus fascia. These structures were incised longitudinally to expose the short external rotators of the hip which were tagged and reflected.  I also released the posterior capsule and tagged this independently.  We made a revision femoral neck cut at this completely displaced fracture site. We then removed the femoral head without much difficulty.  This sized to a 53 on the back table.  We did a trial reduction with the 53 hip ball and this seemed to fit well.  We then broached the femoral canal.  I could feel the rigid bone at the old fracture site past the tip of our trial components and we did not broach this area.  We took him up to a size 3 and then a trial reduction with the +0 53 hip ball and this seemed to give [place] good stability in extension with external rotation and flexion with internal rotation.  I thought the leg lengths were about equal.  The trial component was removed, followed by pulsatile lavage, irrigation of the femoral canal.  We then placed some cement under pressure and placed the aforementioned size 3 Summit basic stem in slight anteversion.  Excess cement was trimmed and pressure was held on the component until the cement had hardened.  We then placed the same +0 53 unipolar metal hip ball assembly on a dry femoral stem.  The hip was again reduced and was stable in the aforementioned  positions.  The wound was irrigated, followed by reapproximation of capsule and short external rotators to the greater trochanteric area with nonabsorbable suture.  We then reapproximated IT band and gluteus maximus fascia with #1 Vicryl in interrupted fashion.  Subcutaneous tissue was reapproximated with 0 and 2-0 undyed Vicryl;  followed by skin closure with staples.  We applied a Mepilex dressing at the end.  Estimated blood loss and intraoperative fluids obtained from anesthesia records.  DISPOSITION:  The patient was taken to recovery room in stable condition.  He is to be admitted to the Medicine Service for appropriate postop care.  He may weight bear as tolerated and will receive aspirin for DVT prophylaxis for about 4 weeks.     Lubertha Basque Jerl Santos, M.D.     PGD/MEDQ  D:  08/26/2012  T:  08/27/2012  Job:  [id number]

## 2012-08-28 LAB — BASIC METABOLIC PANEL
Calcium: 8.1 mg/dL — ABNORMAL LOW (ref 8.4–10.5)
Chloride: 103 mEq/L (ref 96–112)
Creatinine, Ser: 1.58 mg/dL — ABNORMAL HIGH (ref 0.50–1.35)
GFR calc Af Amer: 42 mL/min — ABNORMAL LOW (ref >90)
Sodium: 135 mEq/L (ref 135–145)

## 2012-08-28 LAB — CBC
MCH: 30 pg (ref 26.0–34.0)
MCV: 89 fL (ref 78.0–100.0)
Platelets: 111 10*3/uL — ABNORMAL LOW (ref 150–400)
RDW: 15.7 % — ABNORMAL HIGH (ref 11.5–15.5)
WBC: 9.3 10*3/uL (ref 4.0–10.5)

## 2012-08-28 NOTE — Progress Notes (Signed)
Pt pleasantly confused, cooperative and calm. Has made no attempts to pull at lines. 1:1 safety sitter d/ced. Bed alarm on. Will monitor closely.

## 2012-08-28 NOTE — Care Management Note (Signed)
    Page 1 of 1   08/28/2012     1:51:46 PM   CARE MANAGEMENT NOTE 08/28/2012  Patient:  Russell [name], Russell [name]   Account Number:  [id number]  Date Initiated:  08/27/2012  Documentation initiated by:  Colleen Can  Subjective/Objective Assessment:   dx femoral neck fx;  Ryt hemiarthroplasty     Action/Plan:   SNF rehab   Anticipated DC Date:  08/29/2012   Anticipated DC Plan:  SKILLED NURSING FACILITY  In-house referral  Clinical Social Worker      DC Planning Services  CM consult      Choice offered to / List presented to:             Status of service:  Completed, signed off Medicare Important Message given?   (If response is "NO", the following Medicare IM given date fields will be blank) Date Medicare IM given:   Date Additional Medicare IM given:    Discharge Disposition:    Per UR Regulation:  Reviewed for med. necessity/level of care/duration of stay  If discussed at Long Length of Stay Meetings, dates discussed:    Comments:

## 2012-08-28 NOTE — Progress Notes (Signed)
PT NOTE:  PT deferred this date - pt with HGB @ 7.1 with transfusion pending.  Will follow.

## 2012-08-28 NOTE — Progress Notes (Addendum)
Subjective: 2 Days Post-Op Procedure(s) (LRB): RIGHT HEMI HIP ARTHROPLASTY  (Right) Getting blood today Activity level:  wbat Diet tolerance:  ok Voiding:  foley Patient reports pain as 1 on 0-10 scale.    Objective: Vital signs in last 24 hours: Temp:  [97.6 F (36.4 C)-99.8 F (37.7 C)] 98.8 F (37.1 C) (05/22 1424) Pulse Rate:  [61-75] 67 (05/22 1424) Resp:  [18-20] 18 (05/22 1424) BP: (105-155)/(53-94) 122/85 mmHg (05/22 1424) SpO2:  [91 %-100 %] 98 % (05/22 1424)  Labs:  Recent Labs  08/26/12 0438 08/27/12 0448 08/28/12 0500  HGB 9.0* 7.7* 7.1*    Recent Labs  08/27/12 0448 08/28/12 0500  WBC 6.2 9.3  RBC 2.60* 2.37*  HCT 23.0* 21.1*  PLT 135* 111*    Recent Labs  08/27/12 0448 08/28/12 0500  NA 138 135  K 3.0* 3.9  CL 103 103  CO2 23 22  BUN 20 25*  CREATININE 1.44* 1.58*  GLUCOSE 164* 131*  CALCIUM 8.1* 8.1*   No results found for this basename: LABPT, INR,  in the last 72 hours  Physical Exam:  Neurologically intact ABD soft Neurovascular intact Sensation intact distally Intact pulses distally Dorsiflexion/Plantar flexion intact Incision: dressing C/D/I No cellulitis present Compartment soft  Assessment/Plan:  2 Days Post-Op Procedure(s) (LRB): RIGHT HEMI HIP ARTHROPLASTY  (Right) Up with therapy Discharge to SNF in the next 1-2 days. Patient receiving 2 units of blood today. May be weightbearing as tolerated. ASA 3251 pill twice a day x4 weeks Continue SCDs    Reece Mcbroom R 08/28/2012, 3:09 PM

## 2012-08-28 NOTE — Progress Notes (Signed)
[facility] PROGRESS NOTE  Russell [name] ZOX:[id number] DOB: [birth year] DOA: 08/25/2012 PCP: No primary provider on file.  Assessment/Plan:  1.Right femoral neck fracture - Management per Ortho.  Will defer pain medication to them and recommendations on dressing and wound care.  2. Anemia - Continue to monitor -  Hemoglobin has dropped from 9.0 to 7.1, he is asymptomatic. Will transfuse 2 units prbc  3. Lung mass - No report on recent chest x ray - patient to follow up as outpatient with his pcp for continued evaluation and recommendations.   4. 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 BPH - Pt on Flomax  6 Mild Confusion - at baseline, called and discussed with son who is retired hospitalist, and as per him the patient gets confused under stress.   7 Hypokalemia - potassium replaced  Code Status: full Family Communication:  Called and discussed with son on phone Disposition Plan: Pending continued improvement in condition   Consultants:  Ortho  Procedures:  Right hip hemiarthroplasty  Antibiotics:  None  HPI/Subjective: Patient seen and examined, feels better today.  Objective: Filed Vitals:   08/28/12 1127 08/28/12 1130 08/28/12 1223 08/28/12 1328  BP:  105/60 155/53 124/83  Pulse:   68 75  Temp:  98.9 F (37.2 C) 98.5 F (36.9 C) 98.9 F (37.2 C)  TempSrc:  Oral Oral Oral  Resp: 18 18 18 18   SpO2:        Intake/Output Summary (Last 24 hours) at 08/28/12 1354 Last data filed at 08/28/12 1342  Gross per 24 hour  Intake 3198.33 ml  Output    700 ml  Net 2498.33 ml   There were no vitals filed for this visit.  Exam:   General:  Pt in NAD, Alert and Awake, pleasantly confused  Cardiovascular: s1s2 RRR,   Respiratory: CTA BL, no wheezes  Abdomen: soft, non tender, no organomegaly  Musculoskeletal: no clubbing, no active bleeding  Data Reviewed: Basic Metabolic  Panel:  Recent Labs Lab 08/25/12 1240 08/26/12 0438 08/27/12 0448 08/28/12 0500  NA 138 139 138 135  K 3.4* 3.2* 3.0* 3.9  CL 100 104 103 103  CO2 26 24 23 22   GLUCOSE 128* 136* 164* 131*  BUN 21 21 20  25*  CREATININE 1.50* 1.52* 1.44* 1.58*  CALCIUM 8.8 8.4 8.1* 8.1*   Liver Function Tests: No results found for this basename: AST, ALT, ALKPHOS, BILITOT, PROT, ALBUMIN,  in the last 168 hours No results found for this basename: LIPASE, AMYLASE,  in the last 168 hours No results found for this basename: AMMONIA,  in the last 168 hours CBC:  Recent Labs Lab 08/25/12 1240 08/26/12 0438 08/27/12 0448 08/28/12 0500  WBC 6.9 4.7 6.2 9.3  NEUTROABS 5.3  --   --   --   HGB 9.9* 9.0* 7.7* 7.1*  HCT 29.0* 26.6* 23.0* 21.1*  MCV 87.3 87.5 88.5 89.0  PLT 178 171 135* 111*   Cardiac Enzymes: No results found for this basename: CKTOTAL, CKMB, CKMBINDEX, TROPONINI,  in the last 168 hours BNP (last 3 results) No results found for this basename: PROBNP,  in the last 8760 hours CBG: No results found for this basename: GLUCAP,  in the last 168 hours  Recent Results (from the past 240 hour(s))  SURGICAL PCR SCREEN     Status: Abnormal   Collection Time    08/26/12 11:01 AM      Result Value Range  Status   MRSA, PCR POSITIVE (*) NEGATIVE Final   Comment: RESULT CALLED TO, READ BACK BY AND VERIFIED WITH:     [id number]   Staphylococcus aureus POSITIVE (*) NEGATIVE Final   Comment:            The Xpert SA Assay (FDA     approved for NASAL specimens     in patients over 73 years of age),     is one component of     a comprehensive surveillance     program.  Test performance has     been validated by [employer] for patients greater     than or equal to 60 year old.     It is not intended     to diagnose infection nor to     guide or monitor treatment.     RESULT CALLED TO, READ BACK BY AND VERIFIED WITH:     [id number]      Studies: Dg Pelvis Portable  08/26/2012   *RADIOLOGY REPORT*  Clinical Data: Postoperative studies status post right hip replacement.  PORTABLE PELVIS  Comparison: 08/25/2012.  Findings: There has been interval placement of a right-sided hip hemiarthroplasty.  The femoral stem of the prosthesis appears properly seated without definite periprosthetic fracture or other acute complicating features.  The prosthetic femoral head projects over the acetabulum.  Gas is present in the overlying joint space and adjacent soft tissues.  Surgical staples is seen lateral to the joint space.  An old healed fracture of the proximal third of the right femoral diaphysis is incidentally noted.  IMPRESSION: 1.  Postoperative changes of right-sided hip arthroplasty without acute complicating features, as above.   Original Report Authenticated By: Trudie Reed, M.D.    Scheduled Meds: . aspirin EC  325 mg Oral BID  . Chlorhexidine Gluconate Cloth  6 each Topical Q0600  . finasteride  5 mg Oral QHS  . mupirocin ointment  1 application Nasal BID  . potassium chloride  40 mEq Oral BID  . sertraline  100 mg Oral Daily  . tamsulosin  0.4 mg Oral QPC supper   Continuous Infusions: . dextrose 5 % and 0.45% NaCl 1,000 mL with potassium chloride 20 mEq infusion 10 mL/hr at 08/28/12 1040    Principal Problem:   Fracture of right hip Active Problems:   Renal failure, unspecified   Weakness   Anemia   Lung mass    Time spent: > 35 minutes    Rasa Degrazia S  [facility] Pager [phone] If 7PM-7AM, please contact night-coverage at [email or web address], password [facility] 08/28/2012, 1:54 PM  LOS: 3 days

## 2012-08-28 NOTE — Progress Notes (Signed)
Pt had not voided since foley d/c this AM. Bladder scan shows in bladder. No bladder distention, pt denies urge. Dr Sharl Ma aware. ORders to place foley cath.

## 2012-08-29 DIAGNOSIS — R5381 Other malaise: Secondary | ICD-10-CM

## 2012-08-29 DIAGNOSIS — R5383 Other fatigue: Secondary | ICD-10-CM

## 2012-08-29 LAB — TYPE AND SCREEN
ABO/RH(D): O POS
Unit division: 0

## 2012-08-29 LAB — BASIC METABOLIC PANEL
CO2: 23 mEq/L (ref 19–32)
Calcium: 8.3 mg/dL — ABNORMAL LOW (ref 8.4–10.5)
Chloride: 102 mEq/L (ref 96–112)
Creatinine, Ser: 1.4 mg/dL — ABNORMAL HIGH (ref 0.50–1.35)
Glucose, Bld: 126 mg/dL — ABNORMAL HIGH (ref 70–99)
Sodium: 134 mEq/L — ABNORMAL LOW (ref 135–145)

## 2012-08-29 LAB — CBC
Hemoglobin: 9.5 g/dL — ABNORMAL LOW (ref 13.0–17.0)
MCH: 29.1 pg (ref 26.0–34.0)
MCV: 84.4 fL (ref 78.0–100.0)
RBC: 3.27 MIL/uL — ABNORMAL LOW (ref 4.22–5.81)

## 2012-08-29 MED ORDER — HYDROCODONE-ACETAMINOPHEN 5-325 MG PO TABS: 1.0000 | ORAL_TABLET | Freq: Four times a day (QID) | ORAL | Status: DC | PRN

## 2012-08-29 MED ORDER — ASPIRIN EC 325 MG PO TBEC: 325.0000 mg | DELAYED_RELEASE_TABLET | Freq: Two times a day (BID) | ORAL | Status: AC

## 2012-08-29 MED ORDER — DIAZEPAM 5 MG PO TABS: 5.0000 mg | ORAL_TABLET | Freq: Two times a day (BID) | ORAL | Status: DC | PRN

## 2012-08-29 NOTE — Clinical Documentation Improvement (Signed)
Anemia Blood Loss Clarification  THIS DOCUMENT IS NOT A PERMANENT PART OF THE MEDICAL RECORD  RESPOND TO THE THIS QUERY, FOLLOW THE INSTRUCTIONS BELOW:  1. If needed, update documentation for the patient's encounter via the notes activity.  2. Access this query again and click edit on the In [employer].  3. After updating, or not, click F2 to complete all highlighted (required) fields concerning your review. Select "additional documentation in the medical record" OR "no additional documentation provided".  4. Click Sign note button.  5. The deficiency will fall out of your In Basket *Please let us know if you are not able to complete this workflow by phone or e-mail (listed below).        08/29/12  Dear Dr. Sharl Ma, G/Associates  In an effort to better capture your patient's severity of illness, reflect appropriate length of stay and utilization of resources, a review of the patient medical record has revealed the following indicators.    Based on your clinical judgment, please clarify and document in a progress note and/or discharge summary the clinical condition associated with the following supporting information:  In responding to this query please exercise your independent judgment.  The fact that a query is asked, does not imply that any particular answer is desired or expected.   Pt Post Op H/H=7.1/21.1 necessitating a transfusion   Clarification Needed    Please clarify if Post Op H/H can be further specified as one of the diagnoses listed below and document in pn or d/c summary.     Possible Clinical Conditions?   " Expected Acute Blood Loss Anemia  " Acute Blood Loss Anemia  " Acute on chronic blood loss anemia   " Other Condition________________  " Cannot Clinically Determine  Risk Factors: (recent surgery, pre op anemia, EBL in OR)  Supporting Information: PN 08/27/12 Anemia  - Continue to monitor  -  Hemoglobin has dropped from 9.0 to 7.7, he is  asymptomatic.   Signs and Symptoms Hip fracture  Renal failure  Hyponatremia  Anemia  Constipation hemiarhroplasty  Diagnostics: Component     Latest Ref Rng 08/25/2012 08/26/2012          4:38 AM  Hemoglobin     13.0 - 17.0 g/dL 9.9 (L) 9.0 (L)  HCT     39.0 - 52.0 % 29.0 (L) 26.6 (L)   Component     Latest Ref Rng 08/27/2012 08/28/2012           Hemoglobin     13.0 - 17.0 g/dL 7.7 (L) 7.1 (L)  HCT     39.0 - 52.0 % 23.0 (L) 21.1 (L)   Treatments: Transfusion:  Transfuse RBC ordered on 08/28/12  IV fluids   dextrose 5 % and 0.45% NaCl 1,000 mL with potassium chloride 20 mEq infusion     Serial H&H monitoring and Ferritin    Reviewed: additional documentation in the medical record ljh  Thank You,  Enis Slipper  RN, BSN, MSN/Inf, CCDS Clinical Documentation Specialist [facility] Dept Pager: [phone] / E-mail: [email or web address]  Health Information Management [place]

## 2012-08-29 NOTE — Progress Notes (Signed)
SNF bed available at [facility] today if pt is stable for d/c. Pt's son toured SNF yesterday and completed admission papers for pending placement.   Cori Razor LCSW [phone]

## 2012-08-29 NOTE — Clinical Documentation Improvement (Signed)
RENAL FAILURE DOCUMENTATION CLARIFICATION QUERY  THIS DOCUMENT IS NOT A PERMANENT PART OF THE MEDICAL RECORD  TO RESPOND TO THE THIS QUERY, FOLLOW THE INSTRUCTIONS BELOW:  1. If needed, update documentation for the patient's encounter via the notes activity.  2. Access this query again and click edit on the In [employer].  3. After updating, or not, click F2 to complete all highlighted (required) fields concerning your review. Select "additional documentation in the medical record" OR "no additional documentation provided".  4. Click Sign note button.  5. The deficiency will fall out of your In Basket *Please let us know if you are not able to complete this workflow by phone or e-mail (listed below).  Please update your documentation within the medical record to reflect your response to this query.                                                                                    08/29/12  Dear Dr. Sharl Ma, G   In a better effort to capture your patient's severity of illness, reflect appropriate length of stay and utilization of resources, a review of the patient medical record has revealed the following indicators.    Based on your clinical judgment, please clarify and document in a progress note and/or discharge summary the clinical condition associated with the following supporting information:  In responding to this query please exercise your independent judgment.  The fact that a query is asked, does not imply that any particular answer is desired or expected.  Pt with "renal failure" unspecified.  Clarification Needed   Please specify the type of renal failure and document in pn or d/c summary    Possible Clinical Conditions?  _______Acute Renal Failure _______Acute Kidney Injury _______Acute Tubular Necrosis _______Acute Renal Cortical Necrosis _______Acute Renal Medullary Necrosis _______Acute on Chronic Renal Failure  If Chronic Renal failure please document the  stage _______CKD Stage I -  GFR > OR = 90 _______CKD Stage II - GFR 60-80 _______CKD Stage III - GFR 30-59 _______CKD Stage IV - GFR 15-29 _______CKD Stage V - GFR < 15 _______ESRD (End Stage Renal Disease)   _______Other Condition_____________ _______Cannot Clinically Determine     Supporting Information:  Risk Factors: Hip fracture  Renal failure unspecified Hyponatremia  Anemia  Constipation  hemiarthroplasty hyponatremia  Signs and Symptoms:  Diagnostics:  Lab: Component      BUN  Latest Ref Rng      6 - 23 mg/dL  1/61/0960      25 (H)   Component      Creatinine  Latest Ref Rng      0.50 - 1.35 mg/dL  4/54/0981      1.91 (H)  08/26/2012     4:38 AM 1.52 (H)   Component      Creatinine  Latest Ref Rng      0.50 - 1.35 mg/dL  4/78/2956      2.13 (H)  08/26/2012     4:38 AM 1.52 (H)   Component      Creatinine  Latest Ref Rng      0.50 - 1.35 mg/dL  0/86/5784  1.44 (H)  08/28/2012      1.58 (H)   Component      GFR calc non Af Amer  Latest Ref Rng      >90 mL/min  08/25/2012      38 (L)  08/26/2012     4:38 AM 38 (L)   Component      GFR calc non Af Amer  Latest Ref Rng      >90 mL/min  08/27/2012      40 (L)  08/28/2012      36 (L)    Treatments: Monitoring                    You may use possible, probable, or suspect with inpatient documentation. possible, probable, suspected diagnoses MUST be documented at the time of discharge  Reviewed: additional documentation in the medical record ljh  Thank You,  Enis Slipper  RN, BSN, MSN/Inf, CCDS Clinical Documentation Specialist [facility] Dept Pager: [phone] / E-mail: [email or web address]  Health Information Management [place]

## 2012-08-29 NOTE — Progress Notes (Signed)
Had am labs rescheduled for 0800. Patient asleep after being awake previous day and most of the night.

## 2012-08-29 NOTE — Progress Notes (Signed)
Physical Therapy Treatment Patient Details Name: Russell [name] MRN: [id number] DOB: 1916/10/01 Today's Date: 08/29/2012 Time: 1007-1030 PT Time Calculation (min): 23 min  PT Assessment / Plan / Recommendation Comments on Treatment Session  Arrived at room to nursing requesting assistance to get pt up to [facility]. Pt continues to require +2 assistance for all mobility. Still presents with confusion. Plan is for d/c to SNF today.     Follow Up Recommendations  SNF     Does the patient have the potential to tolerate intense rehabilitation     Barriers to Discharge        Equipment Recommendations  None recommended by PT    Recommendations for Other Services    Frequency Min 3X/week   Plan Discharge plan remains appropriate    Precautions / Restrictions Precautions Precautions: Fall;Posterior Hip Precaution Comments: pt currently unable to recall any THP 2* cognitive status Required Braces or Orthoses: Knee Immobilizer - Right Knee Immobilizer - Right: Other (comment) Restrictions Weight Bearing Restrictions: No RLE Weight Bearing: Weight bearing as tolerated   Pertinent Vitals/Pain Pain with activity-pt unable to rate R LE    Mobility  Bed Mobility Bed Mobility: Supine to Sit Supine to Sit: 1: +2 Total assist;HOB elevated Supine to Sit: Patient Percentage: 20% Sit to Supine: 1: +2 Total assist Sit to Supine: Patient Percentage: 20% Details for Bed Mobility Assistance: pt with difficulty following cues but intermittantly assisting when tasks physically initiated for him. Assist for trunk to upright/supine and bil LEs off/onto bed.  Transfers Transfers: Sit to Stand;Stand to Sit;Stand Pivot Transfers Sit to Stand: 1: +2 Total assist;From elevated surface;From bed;From chair/3-in-1 Sit to Stand: Patient Percentage: 30% Stand to Sit: 1: +2 Total assist;To elevated surface;To bed;To chair/3-in-1 Stand to Sit: Patient Percentage: 20% Stand Pivot Transfers: 1: +2 Total  assist Stand Pivot Transfers: Patient Percentage: 20% Details for Transfer Assistance: x 2. Assist to rise,stablize, control descent, maneuver with RW. Pt with difficulty WBing on R LE. Encourged increased use of UEs to assist with WBing. Assist to weightshift for pivot and managmenet of LEs.  Ambulation/Gait Ambulation/Gait Assistance: Not tested (comment)    Exercises     PT Diagnosis:    PT Problem List:   PT Treatment Interventions:     PT Goals Acute Rehab PT Goals Pt will go Supine/Side to Sit: with mod assist PT Goal: Supine/Side to Sit - Progress: Progressing toward goal Pt will go Sit to Supine/Side: with mod assist PT Goal: Sit to Supine/Side - Progress: Progressing toward goal Pt will go Sit to Stand: with mod assist PT Goal: Sit to Stand - Progress: Progressing toward goal Pt will go Stand to Sit: with mod assist PT Goal: Stand to Sit - Progress: Progressing toward goal  Visit Information  Last PT Received On: 08/29/12 Assistance Needed: +2    Subjective Data  Subjective: Wheres the toilet? Patient Stated Goal: Pt unable to formulate goals   Cognition  Cognition Arousal/Alertness: Awake/alert Behavior During Therapy: WFL for tasks assessed/performed Overall Cognitive Status: History of cognitive impairments - at baseline    Balance     End of Session PT - End of Session Activity Tolerance: Patient limited by pain Patient left: in bed;with call bell/phone within reach;with nursing in room   GP     Rebeca Alert, MPT Pager: [phone]

## 2012-08-29 NOTE — Progress Notes (Signed)
Subjective: 3 Days Post-Op Procedure(s) (LRB): RIGHT HEMI HIP ARTHROPLASTY  (Right) Received blood yesterday. Patient feels good and is very talkative  Activity level:  May be weightbearing as tolerated Diet tolerance:  Eating Voiding:  foley Patient reports pain as 1 on 0-10 scale.    Objective: Vital signs in last 24 hours: Temp:  [97.6 F (36.4 C)-99.2 F (37.3 C)] 97.8 F (36.6 C) (05/23 0740) Pulse Rate:  [61-101] 72 (05/23 0740) Resp:  [16-18] 18 (05/23 0740) BP: (105-173)/(53-85) 156/85 mmHg (05/23 0740) SpO2:  [94 %-98 %] 94 % (05/23 0740)  Labs:  Recent Labs  08/27/12 0448 08/28/12 0500  HGB 7.7* 7.1*    Recent Labs  08/27/12 0448 08/28/12 0500  WBC 6.2 9.3  RBC 2.60* 2.37*  HCT 23.0* 21.1*  PLT 135* 111*    Recent Labs  08/27/12 0448 08/28/12 0500  NA 138 135  K 3.0* 3.9  CL 103 103  CO2 23 22  BUN 20 25*  CREATININE 1.44* 1.58*  GLUCOSE 164* 131*  CALCIUM 8.1* 8.1*   No results found for this basename: LABPT, INR,  in the last 72 hours  Physical Exam:  Neurologically intact ABD soft Neurovascular intact Sensation intact distally Intact pulses distally Dorsiflexion/Plantar flexion intact Incision: dressing C/D/I No cellulitis present Compartment soft Dressing has been changed Assessment/Plan:  3 Days Post-Op Procedure(s) (LRB): RIGHT HEMI HIP ARTHROPLASTY  (Right) Up with therapy Discharge to SNF per medical team ASA 3251 pill twice a day x4 weeks. Continue SCDs while in the hospital May be weightbearing as tolerated May change dressing as needed. Staples to come out in 2 weeks from her surgical date. Return to Dr. Nolon Nations office in 2-3 weeks. Prescriptions for pain medicine and ASA are in the chart    Tal Neer R 08/29/2012, 8:17 AM

## 2012-08-29 NOTE — Clinical Documentation Improvement (Signed)
    CHANGE MENTAL STATUS DOCUMENTATION CLARIFICATION   THIS DOCUMENT IS NOT A PERMANENT PART OF THE MEDICAL RECORD  TO RESPOND TO THE THIS QUERY, FOLLOW THE INSTRUCTIONS BELOW:  1. If needed, update documentation for the patient's encounter via the notes activity.  2. Access this query again and click edit on the In [employer].  3. After updating, or not, click F2 to complete all highlighted (required) fields concerning your review. Select "additional documentation in the medical record" OR "no additional documentation provided".  4. Click Sign note button.  5. The deficiency will fall out of your In Basket *Please let us know if you are not able to complete this workflow by phone or e-mail (listed below).         08/29/12  Dear Dr.  Sharl Ma, Reece Agar [facility] [place]  In an effort to better capture your patient's severity of illness, reflect appropriate length of stay and utilization of resources, a review of the patient medical record has revealed the following indicators.    Based on your clinical judgment, please clarify and document in a progress note and/or discharge summary the clinical condition associated with the following supporting information:  In responding to this query please exercise your independent judgment.  The fact that a query is asked, does not imply that any particular answer is desired or expected.  Pt with confusion noted per pn/eval on 5/21; 5/22 necessitating Valium and Ativan  Clarification Needed   Please clarify if "confusion" in the setting of post op hemiarthroplasty can be further specified as one of the diagnoses listed below and document in pn or d/c summary.    Possible Clinical Conditions?   Post Op Confusion  Encephalopathy (describe type if known)                       Anoxic                       Septic                       Alcoholic                        Hepatic                       Hypertensive                       Metabolic         _______ Toxic  _______Acute delirium  _______Other Condition__________________ _______Cannot Clinically Determine   Supporting Information:  Risk Factors:  Signs & Symptoms:  PN 08/28/12  Pt pleasantly confused, cooperative and calm.  Eval 08/27/12  Behavior During Therapy: [facility] for tasks assessed/performed (pleasantly confused)    Treatment:  diazepam (VALIUM) tablet 5 mg   [[id number]]  LORazepam (ATIVAN) tablet 0.5 mg       Reviewed:  no additional documentation provided ljh  Thank You,  Enis Slipper  RN, BSN, MSN/Inf, CCDS Clinical Documentation Specialist [facility] Dept Pager: [phone] / E-mail: [email or web address]  Health Information Management [place]

## 2012-08-29 NOTE — Discharge Summary (Addendum)
Physician Discharge Summary  TOWNES FUHS ZOX:[id number] DOB: Oct 03, 1916 DOA: 08/25/2012  PCP: No primary provider on file.  Admit date: 08/25/2012 Discharge date: 08/29/2012  Time spent: 50* minutes  Recommendations for Outpatient Follow-up:  1. Follow up urology as outpatient 2. Follow up orthopedics in 2 weeks  Discharge Diagnoses:  Principal Problem:   Fracture of right hip Active Problems:   Renal failure, unspecified   Weakness   Anemia   Lung mass   Discharge Condition: Stable  Diet recommendation: regular  There were no vitals filed for this visit.  History of present illness:  79 y.o. male NHR who is a poor historian and beset with some long term psych issues. Could not get OOB today and was taken to ED where xrays showed femoral neck fracture. Admitted by medical team through hip fracture pathway. Difficult to communicate with but denies pain elsewhere   Hospital Course:  1.Right femoral neck fracture  -patient underwent right hemi hip arthroplasty, at this time pain seems to be well controlled. Weight bearing as tolerated. 2. Anemia  - Post operative anemia, hemoglobin dropped to 7.1 required two units PRBC. Hemoglobin has now improved to 9.5 After two units PRBC. 3. Lung mass  - No report on recent chest x ray  - patient to follow up as outpatient with his pcp for continued evaluation and recommendations.  4. 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 BPH  - Pt on Flomax and Proscar. Foley catheter was removed and he continued to have high residual on the bladder scan, foley was reinserted. At this time we have removed foley for the voiding trial. May need folwy at [place] [place] and outpatient follow up urology 6 Mild Confusion  Resolved, patient does not have dementia. - at baseline, called and discussed with son who is retired hospitalist, and as per him the patient gets confused under stress.  7. CKD stage  3: Patient's creatinine level is at baseline at 1.40  DVT prophylaxis: aspirin 325 mg po BID x 4 weeks  Procedures:  Right hip hemi arthroplasty  Consultations:  orthopedics  Discharge Exam: Filed Vitals:   08/28/12 1623 08/28/12 1700 08/28/12 2158 08/29/12 0740  BP: 157/70 173/75 113/54 156/85  Pulse: 91 101 70 72  Temp: 98.4 F (36.9 C) 98.8 F (37.1 C) 99.2 F (37.3 C) 97.8 F (36.6 C)  TempSrc: Oral Oral Oral Oral  Resp: 16  18 18   SpO2:   97% 94%    General: Appear in no acute distress Cardiovascular: s1s2 RRR Respiratory: Clear bilaterally Ext : No edema  Discharge Instructions  Discharge Orders   Future Orders Complete By Expires     Diet - low sodium heart healthy  As directed     Increase activity slowly  As directed     Weight bearing as tolerated  As directed         Medication List    STOP taking these medications       LORazepam 0.5 MG tablet  Commonly known as:  ATIVAN     traMADol 50 MG tablet  Commonly known as:  ULTRAM      TAKE these medications       acetaminophen 325 MG tablet  Commonly known as:  TYLENOL  Take 650 mg by mouth every 6 (six) hours as needed. For pain.     aspirin EC 325 MG tablet  Take 1 tablet (325 mg total) by mouth  2 (two) times daily.     bisacodyl 10 MG suppository  Commonly known as:  DULCOLAX  Place 10 mg rectally as needed for constipation (constipation).     cholecalciferol 1000 UNITS tablet  Commonly known as:  VITAMIN D  Take 2,000 Units by mouth daily.     diazepam 5 MG tablet  Commonly known as:  VALIUM  Take 1 tablet (5 mg total) by mouth every 12 (twelve) hours as needed. Take 5mg  daily as needed for anxiety and take 5mg  every night at bedtime.     Ensure Plus Liqd  Take 237 mLs by mouth.     finasteride 5 MG tablet  Commonly known as:  PROSCAR  Take 5 mg by mouth at bedtime.     HYDROcodone-acetaminophen 5-325 MG per tablet  Commonly known as:  NORCO  Take 1 tablet by mouth every 6  (six) hours as needed for pain.     omeprazole 40 MG capsule  Commonly known as:  PRILOSEC  Take 40 mg by mouth daily before breakfast.     polyethylene glycol packet  Commonly known as:  MIRALAX / GLYCOLAX  Take 17 g by mouth daily as needed.     ranitidine 300 MG tablet  Commonly known as:  ZANTAC  Take 300 mg by mouth at bedtime.     senna-docusate 8.6-50 MG per tablet  Commonly known as:  Senokot-S  Take 1 tablet by mouth 2 (two) times daily.     sertraline 100 MG tablet  Commonly known as:  ZOLOFT  Take 100 mg by mouth daily.     sorbitol 70 % solution  Take 5 mLs by mouth daily.     tamsulosin 0.4 MG Caps  Commonly known as:  FLOMAX  Take 0.4 mg by mouth daily after supper.       Allergies  Allergen Reactions  . Ambien (Zolpidem Tartrate) Other (See Comments)    Unknown reaction  . Penicillins Other (See Comments)    Unknown reaction       Follow-up Information   Follow up with DALLDORF,PETER G, MD. Call in 2 weeks.   Contact information:   [address] [place] [address] [phone]        The results of significant diagnostics from this hospitalization (including imaging, microbiology, ancillary and laboratory) are listed below for reference.    Significant Diagnostic Studies: Dg Ribs Unilateral W/chest Right  08/25/2012   *RADIOLOGY REPORT*  Clinical Data: Fall, posterior rib pain  RIGHT RIBS AND CHEST - 3+ VIEW  Comparison: 02/07/2012  Findings: Five views right ribs submitted.  Cardiomediastinal silhouette is stable.  No acute infiltrate or pulmonary edema.  No right rib fracture is identified.  No diagnostic pneumothorax.  IMPRESSION: No rib fracture is identified.  No diagnostic pneumothorax.   Original Report Authenticated By: Natasha Mead, M.D.   Dg Hip Complete Right  08/25/2012   *RADIOLOGY REPORT*  Clinical Data: Fall, right hip pain  RIGHT HIP - COMPLETE 2+ VIEW  Comparison: None.  Findings: Four views of the right hip submitted.  Study  is limited by diffuse osteopenia.  There is mild displaced impacted fracture of the right femoral neck .Old fracture deformity of the mid femoral shaft.  IMPRESSION: Mild displaced fracture of the right femoral neck.  Diffuse osteopenia.  Per CMS PQRS reporting requirements (PQRS Measure 24): Given the patient's age of greater than 50 and the fracture site (hip, distal radius, or spine), the patient should be tested for  osteoporosis using DXA, and the appropriate treatment considered based on the DXA results.   Original Report Authenticated By: Natasha Mead, M.D.   Dg Knee 2 Views Right  08/25/2012   *RADIOLOGY REPORT*  Clinical Data: Fall  RIGHT KNEE - 1-2 VIEW  Comparison: None.  Findings: Two views of the right knee submitted.  No acute fracture or subluxation.  There is diffuse osteopenia.  IMPRESSION: No acute fracture or subluxation.  Diffuse osteopenia.   Original Report Authenticated By: Natasha Mead, M.D.   Dg Pelvis Portable  08/26/2012   *RADIOLOGY REPORT*  Clinical Data: Postoperative studies status post right hip replacement.  PORTABLE PELVIS  Comparison: 08/25/2012.  Findings: There has been interval placement of a right-sided hip hemiarthroplasty.  The femoral stem of the prosthesis appears properly seated without definite periprosthetic fracture or other acute complicating features.  The prosthetic femoral head projects over the acetabulum.  Gas is present in the overlying joint space and adjacent soft tissues.  Surgical staples is seen lateral to the joint space.  An old healed fracture of the proximal third of the right femoral diaphysis is incidentally noted.  IMPRESSION: 1.  Postoperative changes of right-sided hip arthroplasty without acute complicating features, as above.   Original Report Authenticated By: Trudie Reed, M.D.   Dg Chest Port 1 View  08/25/2012   *RADIOLOGY REPORT*  Clinical Data: Preop, fall  PORTABLE CHEST - 1 VIEW  Comparison: 02/07/2012  Findings: Cardiomediastinal  silhouette is stable.  No acute infiltrate or pulmonary edema.  No diagnostic pneumothorax.  IMPRESSION: No active disease.  No diagnostic pneumothorax.   Original Report Authenticated By: Natasha Mead, M.D.    Microbiology: Recent Results (from the past 240 hour(s))  SURGICAL PCR SCREEN     Status: Abnormal   Collection Time    08/26/12 11:01 AM      Result Value Range Status   MRSA, PCR POSITIVE (*) NEGATIVE Final   Comment: RESULT CALLED TO, READ BACK BY AND VERIFIED WITH:     [id number]   Staphylococcus aureus POSITIVE (*) NEGATIVE Final   Comment:            The Xpert SA Assay (FDA     approved for NASAL specimens     in patients over 56 years of age),     is one component of     a comprehensive surveillance     program.  Test performance has     been validated by [employer] for patients greater     than or equal to 33 year old.     It is not intended     to diagnose infection nor to     guide or monitor treatment.     RESULT CALLED TO, READ BACK BY AND VERIFIED WITH:     [id number]     Labs: Basic Metabolic Panel:  Recent Labs Lab 08/25/12 1240 08/26/12 0438 08/27/12 0448 08/28/12 0500 08/29/12 0834  NA 138 139 138 135 134*  K 3.4* 3.2* 3.0* 3.9 4.3  CL 100 104 103 103 102  CO2 26 24 23 22 23   GLUCOSE 128* 136* 164* 131* 126*  BUN 21 21 20  25* 28*  CREATININE 1.50* 1.52* 1.44* 1.58* 1.40*  CALCIUM 8.8 8.4 8.1* 8.1* 8.3*   Liver Function Tests: No results found for this basename: AST, ALT, ALKPHOS, BILITOT, PROT, ALBUMIN,  in the last 168 hours No results found for this basename: LIPASE, AMYLASE,  in the last  168 hours No results found for this basename: AMMONIA,  in the last 168 hours CBC:  Recent Labs Lab 08/25/12 1240 08/26/12 0438 08/27/12 0448 08/28/12 0500 08/29/12 0834  WBC 6.9 4.7 6.2 9.3 7.0  NEUTROABS 5.3  --   --   --   --   HGB 9.9* 9.0* 7.7* 7.1* 9.5*  HCT 29.0* 26.6* 23.0* 21.1* 27.6*  MCV 87.3  87.5 88.5 89.0 84.4  PLT 178 171 135* 111* 150   Cardiac Enzymes: No results found for this basename: CKTOTAL, CKMB, CKMBINDEX, TROPONINI,  in the last 168 hours BNP: BNP (last 3 results) No results found for this basename: PROBNP,  in the last 8760 hours CBG: No results found for this basename: GLUCAP,  in the last 168 hours     Signed:  Jeniffer Culliver S  [facility] 08/29/2012, 10:12 AM

## 2012-09-04 ENCOUNTER — Non-Acute Institutional Stay (SKILLED_NURSING_FACILITY): Payer: Medicare Other | Admitting: Nurse Practitioner

## 2012-09-04 ENCOUNTER — Encounter: Payer: Self-pay | Admitting: Nurse Practitioner

## 2012-09-04 DIAGNOSIS — S72009S Fracture of unspecified part of neck of unspecified femur, sequela: Secondary | ICD-10-CM

## 2012-09-04 DIAGNOSIS — R5383 Other fatigue: Secondary | ICD-10-CM

## 2012-09-04 DIAGNOSIS — N400 Enlarged prostate without lower urinary tract symptoms: Secondary | ICD-10-CM

## 2012-09-04 DIAGNOSIS — R531 Weakness: Secondary | ICD-10-CM

## 2012-09-04 DIAGNOSIS — D649 Anemia, unspecified: Secondary | ICD-10-CM

## 2012-09-04 DIAGNOSIS — S72001S Fracture of unspecified part of neck of right femur, sequela: Secondary | ICD-10-CM

## 2012-09-04 DIAGNOSIS — N19 Unspecified kidney failure: Secondary | ICD-10-CM

## 2012-09-04 NOTE — Assessment & Plan Note (Signed)
Pt had difficulty voiding in hospital currently on Flomax and proscar; will cont medications and cont to monitor.

## 2012-09-04 NOTE — Progress Notes (Signed)
Patient ID: Russell [name], male   DOB: 03-01-1917, 79 y.o.   MRN: [id number]   PCP: No primary provider on file.  Code Status: DNR  Allergies  Allergen Reactions  . Ambien (Zolpidem Tartrate) Other (See Comments)    Unknown reaction  . Penicillins Other (See Comments)    Unknown reaction    Chief Complaint: follow up hospitalization   HPI:  79 year old male who was unable to get OOB and was taken to ED where xrays showed femoral neck fracture. He was admitted to the hospital and underwent right hemi hip arthroplasty currently not complaining of pain. Pt had post op Anemia, pts Hgb dropped after surgery he was transfused 2 units prbc and hgb improved to 9.5. During hospitalization after pts Foley catheter was removed and he continued to have high residual on the bladder scan, foley was reinserted. Pt currently on Flomax and Proscar without any complaints of difficulty voiding.Patient's creatinine level is at baseline at 1.40. DVT prophylaxis: aspirin 325 mg po BID x 4 weeks. Pt is currently at [facility] for STR in hopes to go back to assisted living.  Review of Systems:  Review of Systems  Constitutional: Negative for fever and chills.  Respiratory: Negative for cough and shortness of breath.   Cardiovascular: Negative for chest pain.  Gastrointestinal: Negative for abdominal pain, diarrhea and constipation.  Genitourinary: Negative for dysuria, urgency and frequency.  Musculoskeletal: Positive for myalgias.  Skin: Negative.   Neurological: Positive for weakness. Negative for headaches.     Past Medical History  Diagnosis Date  . Stroke   . Acute bronchitis   . Hypertension    Past Surgical History  Procedure Laterality Date  . Hernia repair    . Lumbar disc surgery    . Hip arthroplasty Right 08/26/2012    Procedure: RIGHT HEMI HIP ARTHROPLASTY ;  Surgeon: Velna Ochs, MD;  Location: [facility] ORS;  Service: Orthopedics;  Laterality: Right;   Social History:   reports that  he has never smoked. He has never used smokeless tobacco. He reports that he does not drink alcohol or use illicit drugs.  Family History  Problem Relation Age of Onset  . CVA Mother     Medications: Patient's Medications  New Prescriptions   No medications on file  Previous Medications   ACETAMINOPHEN (TYLENOL) 325 MG TABLET    Take 650 mg by mouth every 6 (six) hours as needed. For pain.   ASPIRIN EC 325 MG TABLET    Take 1 tablet (325 mg total) by mouth 2 (two) times daily.   BISACODYL (DULCOLAX) 10 MG SUPPOSITORY    Place 10 mg rectally as needed for constipation (constipation).   CHOLECALCIFEROL (VITAMIN D) 1000 UNITS TABLET    Take 2,000 Units by mouth daily.   DIAZEPAM (VALIUM) 5 MG TABLET    Take 1 tablet (5 mg total) by mouth every 12 (twelve) hours as needed. Take 5mg  daily as needed for anxiety and take 5mg  every night at bedtime.   ENSURE PLUS (ENSURE PLUS) LIQD    Take 237 mLs by mouth.   FINASTERIDE (PROSCAR) 5 MG TABLET    Take 5 mg by mouth at bedtime.   HYDROCODONE-ACETAMINOPHEN (NORCO) 5-325 MG PER TABLET    Take 1 tablet by mouth every 6 (six) hours as needed for pain.   OMEPRAZOLE (PRILOSEC) 40 MG CAPSULE    Take 40 mg by mouth daily before breakfast.   POLYETHYLENE GLYCOL (MIRALAX / GLYCOLAX) PACKET  Take 17 g by mouth daily as needed.   SENNA-DOCUSATE (SENOKOT-S) 8.6-50 MG PER TABLET    Take 1 tablet by mouth 2 (two) times daily.   SERTRALINE (ZOLOFT) 100 MG TABLET    Take 100 mg by mouth daily.   SORBITOL 70 % SOLUTION    Take 5 mLs by mouth daily.   TAMSULOSIN HCL (FLOMAX) 0.4 MG CAPS    Take 0.4 mg by mouth daily after supper.  Modified Medications   No medications on file  Discontinued Medications   RANITIDINE (ZANTAC) 300 MG TABLET    Take 300 mg by mouth at bedtime.     Physical Exam:  Filed Vitals:   09/04/12 1536  BP: 135/78  Pulse: 60  Temp: 97.4 F (36.3 C)  Resp: 20   Physical Exam  Constitutional: No distress.  Neck: Normal range of  motion. Neck supple.  Cardiovascular: Normal rate, regular rhythm and normal heart sounds.   Pulmonary/Chest: Effort normal and breath sounds normal. No respiratory distress.  Abdominal: Soft. Bowel sounds are normal. He exhibits no distension.  Musculoskeletal: He exhibits tenderness (to right extermity ). He exhibits no edema.  Right leg in brace  Neurological: He is alert.  Oriented to self only  Skin: Skin is warm and dry. He is not diaphoretic.      Labs reviewed: Basic Metabolic Panel:  Recent Labs  16/10/96 0448 08/28/12 0500 08/29/12 0834  NA 138 135 134*  K 3.0* 3.9 4.3  CL 103 103 102  CO2 23 22 23   GLUCOSE 164* 131* 126*  BUN 20 25* 28*  CREATININE 1.44* 1.58* 1.40*  CALCIUM 8.1* 8.1* 8.3*   Liver Function Tests:  Recent Labs  02/06/12 0611  AST 10  ALT 8  ALKPHOS 36*  BILITOT 0.2*  PROT 6.2  ALBUMIN 2.6*   No results found for this basename: LIPASE, AMYLASE,  in the last 8760 hours No results found for this basename: AMMONIA,  in the last 8760 hours CBC:  Recent Labs  02/06/12 0611  03/18/12 1235 08/25/12 1240  08/27/12 0448 08/28/12 0500 08/29/12 0834  WBC 8.4  < > 3.5* 6.9  < > 6.2 9.3 7.0  NEUTROABS 5.6  --  2.1 5.3  --   --   --   --   HGB 8.6*  < > 9.9* 9.9*  < > 7.7* 7.1* 9.5*  HCT 24.2*  < > 28.0* 29.0*  < > 23.0* 21.1* 27.6*  MCV 87.1  < > 85.1 87.3  < > 88.5 89.0 84.4  PLT 299  < > 265 178  < > 135* 111* 150  < > = values in this interval not displayed. Cardiac Enzymes: No results found for this basename: CKTOTAL, CKMB, CKMBINDEX, TROPONINI,  in the last 8760 hours BNP: No components found with this basename: POCBNP,  CBG:  Recent Labs  03/18/12 1318  GLUCAP 108*    Radiological Exams: Dg Ribs Unilateral W/chest Right  08/25/2012 *RADIOLOGY REPORT* Clinical Data: Fall, posterior rib pain RIGHT RIBS AND CHEST - 3+ VIEW Comparison: 02/07/2012 Findings: Five views right ribs submitted. Cardiomediastinal silhouette is stable.  No acute infiltrate or pulmonary edema. No right rib fracture is identified. No diagnostic pneumothorax. IMPRESSION: No rib fracture is identified. No diagnostic pneumothorax. Original Report Authenticated By: Natasha Mead, M.D.  Dg Hip Complete Right  08/25/2012 *RADIOLOGY REPORT* Clinical Data: Fall, right hip pain RIGHT HIP - COMPLETE 2+ VIEW Comparison: None. Findings: Four views of the right hip submitted.  Study is limited by diffuse osteopenia. There is mild displaced impacted fracture of the right femoral neck .Old fracture deformity of the mid femoral shaft. IMPRESSION: Mild displaced fracture of the right femoral neck. Diffuse osteopenia. Per CMS PQRS reporting requirements (PQRS Measure 24): Given the patient's age of greater than 50 and the fracture site (hip, distal radius, or spine), the patient should be tested for osteoporosis using DXA, and the appropriate treatment considered based on the DXA results. Original Report Authenticated By: Natasha Mead, M.D.  Dg Knee 2 Views Right  08/25/2012 *RADIOLOGY REPORT* Clinical Data: Fall RIGHT KNEE - 1-2 VIEW Comparison: None. Findings: Two views of the right knee submitted. No acute fracture or subluxation. There is diffuse osteopenia. IMPRESSION: No acute fracture or subluxation. Diffuse osteopenia. Original Report Authenticated By: Natasha Mead, M.D.  Dg Pelvis Portable  08/26/2012 *RADIOLOGY REPORT* Clinical Data: Postoperative studies status post right hip replacement. PORTABLE PELVIS Comparison: 08/25/2012. Findings: There has been interval placement of a right-sided hip hemiarthroplasty. The femoral stem of the prosthesis appears properly seated without definite periprosthetic fracture or other acute complicating features. The prosthetic femoral head projects over the acetabulum. Gas is present in the overlying joint space and adjacent soft tissues. Surgical staples is seen lateral to the joint space. An old healed fracture of the proximal third of the right  femoral diaphysis is incidentally noted. IMPRESSION: 1. Postoperative changes of right-sided hip arthroplasty without acute complicating features, as above. Original Report Authenticated By: Trudie Reed, M.D.  Dg Chest Port 1 View  08/25/2012 *RADIOLOGY REPORT* Clinical Data: Preop, fall PORTABLE CHEST - 1 VIEW Comparison: 02/07/2012 Findings: Cardiomediastinal silhouette is stable. No acute infiltrate or pulmonary edema. No diagnostic pneumothorax. IMPRESSION: No active disease. No diagnostic pneumothorax. Original Report Authenticated By: Natasha Mead, M.D.       Assessment/Plan Fracture of right hip S/p right femoral neck fracture- working with therapy; pain controlled with current medications.   Renal failure, unspecified Will follow up BMP  Anemia Will follow up CBC  BPH (benign prostatic hyperplasia) Pt had difficulty voiding in hospital currently on Flomax and proscar; will cont medications and cont to monitor.   Weakness Weakness and debility. At [facility] for rehab     Labs/tests ordered Cbc and bmp

## 2012-09-04 NOTE — Assessment & Plan Note (Signed)
Weakness and debility. At [facility] for rehab

## 2012-09-04 NOTE — Assessment & Plan Note (Signed)
S/p right femoral neck fracture- working with therapy; pain controlled with current medications.

## 2012-09-04 NOTE — Assessment & Plan Note (Signed)
Will follow up CBC  

## 2012-09-04 NOTE — Assessment & Plan Note (Signed)
Will follow up BMP

## 2012-09-11 ENCOUNTER — Encounter (HOSPITAL_COMMUNITY): Payer: Self-pay

## 2012-09-11 ENCOUNTER — Emergency Department (HOSPITAL_COMMUNITY): Payer: Medicare Other

## 2012-09-11 ENCOUNTER — Emergency Department (HOSPITAL_COMMUNITY)
Admission: EM | Admit: 2012-09-11 | Discharge: 2012-09-11 | Disposition: A | Discharge status: Skilled Nursing Facility | Payer: Medicare Other | Attending: Emergency Medicine | Admitting: Emergency Medicine

## 2012-09-11 DIAGNOSIS — R45851 Suicidal ideations: Secondary | ICD-10-CM | POA: Insufficient documentation

## 2012-09-11 DIAGNOSIS — Z880 Allergy status to penicillin: Secondary | ICD-10-CM | POA: Insufficient documentation

## 2012-09-11 DIAGNOSIS — Z8709 Personal history of other diseases of the respiratory system: Secondary | ICD-10-CM | POA: Insufficient documentation

## 2012-09-11 DIAGNOSIS — F22 Delusional disorders: Secondary | ICD-10-CM | POA: Insufficient documentation

## 2012-09-11 DIAGNOSIS — Z7982 Long term (current) use of aspirin: Secondary | ICD-10-CM | POA: Insufficient documentation

## 2012-09-11 DIAGNOSIS — I10 Essential (primary) hypertension: Secondary | ICD-10-CM | POA: Insufficient documentation

## 2012-09-11 DIAGNOSIS — Z8673 Personal history of transient ischemic attack (TIA), and cerebral infarction without residual deficits: Secondary | ICD-10-CM | POA: Insufficient documentation

## 2012-09-11 DIAGNOSIS — Z79899 Other long term (current) drug therapy: Secondary | ICD-10-CM | POA: Insufficient documentation

## 2012-09-11 LAB — BASIC METABOLIC PANEL
BUN: 28 mg/dL — ABNORMAL HIGH (ref 6–23)
CO2: 20 mEq/L (ref 19–32)
Calcium: 8.6 mg/dL (ref 8.4–10.5)
Creatinine, Ser: 1.22 mg/dL (ref 0.50–1.35)
GFR calc non Af Amer: 49 mL/min — ABNORMAL LOW (ref >90)
Glucose, Bld: 100 mg/dL — ABNORMAL HIGH (ref 70–99)
Sodium: 133 mEq/L — ABNORMAL LOW (ref 135–145)

## 2012-09-11 LAB — CBC WITH DIFFERENTIAL/PLATELET
Eosinophils Absolute: 0.1 10*3/uL (ref 0.0–0.7)
Eosinophils Relative: 2 % (ref 0–5)
HCT: 28.3 % — ABNORMAL LOW (ref 39.0–52.0)
Lymphs Abs: 1.9 10*3/uL (ref 0.7–4.0)
MCH: 29 pg (ref 26.0–34.0)
MCV: 84.7 fL (ref 78.0–100.0)
Monocytes Absolute: 1 10*3/uL (ref 0.1–1.0)
Platelets: 373 10*3/uL (ref 150–400)
RBC: 3.34 MIL/uL — ABNORMAL LOW (ref 4.22–5.81)

## 2012-09-11 NOTE — ED Notes (Addendum)
Gave patient a blanket. 

## 2012-09-11 NOTE — ED Notes (Signed)
Tele psych completed.  Pt was incontinent of stool.  Pt cleaned and new sterile dressing applied to right hip.  Pt sitting up eating dinner at this time with no complaints voiced.

## 2012-09-11 NOTE — ED Notes (Signed)
Papers being filled out for tele psych.

## 2012-09-11 NOTE — ED Notes (Signed)
Pt presents with report of suicidal ideations today that was witnessed by staff.  Per EMS, pt expressed wishing to die and asked staff to cut his tongue out.

## 2012-09-11 NOTE — ED Provider Notes (Addendum)
History     CSN: [id number]  Arrival date & time 09/11/12  1221   First MD Initiated Contact with Patient 09/11/12 1223      No chief complaint on file.   (Consider location/radiation/quality/duration/timing/severity/associated sxs/prior treatment) HPI Comments: Patient currently has no complaints today and states that he is in his normal state of health. He denies any pain, shortness of breath, fever or other associated symptoms. He states he does not know when it happened today and he does not know why he is here. Speaking with the nursing home today patient's told the nurse that he wanted to die. He states that he had a real knife he would slit his throat. She stated at that time they called EMS and the police and he told the police officer he had his gun he would put it to his temple and pulled the trigger. The nurse stated they spoke with his son who states he's had suicidal ideations in the past and attempted to act on them. Currently in the room patient states that the facility is making all this up and putting words into his mouth to make him angry.  The history is provided by the nursing home and the patient.    Past Medical History  Diagnosis Date  . Stroke   . Acute bronchitis   . Hypertension     Past Surgical History  Procedure Laterality Date  . Hernia repair    . Lumbar disc surgery    . Hip arthroplasty Right 08/26/2012    Procedure: RIGHT HEMI HIP ARTHROPLASTY ;  Surgeon: Velna Ochs, MD;  Location: [facility];  Service: Orthopedics;  Laterality: Right;    Family History  Problem Relation Age of Onset  . CVA Mother     History  Substance Use Topics  . Smoking status: Never Smoker   . Smokeless tobacco: Never Used  . Alcohol Use: No      Review of Systems  All other systems reviewed and are negative.    Allergies  Ambien and Penicillins  Home Medications   Current Outpatient Rx  Name  Route  Sig  Dispense  Refill  . acetaminophen (TYLENOL) 325  MG tablet   Oral   Take 650 mg by mouth every 6 (six) hours as needed. For pain.         Marland Kitchen aspirin EC 325 MG tablet   Oral   Take 1 tablet (325 mg total) by mouth 2 (two) times daily.   60 tablet   0     BID x 4 weeks   . bisacodyl (DULCOLAX) 10 MG suppository   Rectal   Place 10 mg rectally as needed for constipation (constipation).         . cholecalciferol (VITAMIN D) 1000 UNITS tablet   Oral   Take 2,000 Units by mouth daily.         . diazepam (VALIUM) 5 MG tablet   Oral   Take 1 tablet (5 mg total) by mouth every 12 (twelve) hours as needed. Take 5mg  daily as needed for anxiety and take 5mg  every night at bedtime.   30 tablet   0   . Ensure Plus (ENSURE PLUS) LIQD   Oral   Take 237 mLs by mouth.         . finasteride (PROSCAR) 5 MG tablet   Oral   Take 5 mg by mouth at bedtime.         Marland Kitchen HYDROcodone-acetaminophen (NORCO) 5-325  MG per tablet   Oral   Take 1 tablet by mouth every 6 (six) hours as needed for pain.   30 tablet   0   . omeprazole (PRILOSEC) 40 MG capsule   Oral   Take 40 mg by mouth daily before breakfast.         . polyethylene glycol (MIRALAX / GLYCOLAX) packet   Oral   Take 17 g by mouth daily as needed.   14 each   0   . ranitidine (ZANTAC) 300 MG tablet   Oral   Take 300 mg by mouth at bedtime.         . senna-docusate (SENOKOT-S) 8.6-50 MG per tablet   Oral   Take 1 tablet by mouth 2 (two) times daily.         . sertraline (ZOLOFT) 100 MG tablet   Oral   Take 100 mg by mouth daily.         . sorbitol 70 % solution   Oral   Take 5 mLs by mouth daily.         . Tamsulosin HCl (FLOMAX) 0.4 MG CAPS   Oral   Take 0.4 mg by mouth daily after supper.           BP 172/75  Pulse 58  Temp(Src) 97.8 F (36.6 C) (Oral)  Resp 20  SpO2 98%  Physical Exam  Nursing note and vitals reviewed. Constitutional: He is oriented to person, place, and time. He appears well-developed and well-nourished. No distress.   HENT:  Head: Normocephalic and atraumatic.  Mouth/Throat: Oropharynx is clear and moist.  Eyes: Conjunctivae and EOM are normal. Pupils are equal, round, and reactive to light.  Neck: Normal range of motion. Neck supple.  Cardiovascular: Normal rate, regular rhythm and intact distal pulses.   No murmur heard. Pulmonary/Chest: Effort normal and breath sounds normal. No respiratory distress. He has no wheezes. He has no rales.  Abdominal: Soft. He exhibits no distension. There is no tenderness. There is no rebound and no guarding.  Musculoskeletal: Normal range of motion. He exhibits no edema and no tenderness.  Neurological: He is alert and oriented to person, place, and time.  Skin: Skin is warm and dry. No rash noted. No erythema.  Psychiatric: He has a normal mood and affect. His behavior is normal. His affect is not labile and not inappropriate. Thought content is paranoid. He does not exhibit a depressed mood. He expresses no homicidal and no suicidal ideation.    ED Course  Procedures (including critical care time)  Labs Reviewed  CBC WITH DIFFERENTIAL - Abnormal; Notable for the following:    RBC 3.34 (*)    Hemoglobin 9.7 (*)    HCT 28.3 (*)    RDW 16.6 (*)    Monocytes Relative 18 (*)    All other components within normal limits  BASIC METABOLIC PANEL - Abnormal; Notable for the following:    Sodium 133 (*)    Potassium 3.4 (*)    Glucose, Bld 100 (*)    BUN 28 (*)    GFR calc non Af Amer 49 (*)    GFR calc Af Amer 57 (*)    All other components within normal limits  ETHANOL  URINALYSIS, ROUTINE W REFLEX MICROSCOPIC  URINE RAPID DRUG SCREEN (HOSP PERFORMED)   Dg Chest 2 View  09/11/2012   *RADIOLOGY REPORT*  Clinical Data: 79 year old male with weakness, confusion.  CHEST - 2 VIEW  Comparison: 08/25/2012 and  earlier.  Findings: AP and lateral views of the chest.  Lung volumes are stable and within normal limits.  Stable cardiac size at the upper limits of normal.   Stable tortuosity the thoracic aorta. Other mediastinal contours are within normal limits.  Visualized tracheal air column is within normal limits.  No pneumothorax, pulmonary edema, pleural effusion or confluent pulmonary opacity. Osteopenia. No acute osseous abnormality identified.  IMPRESSION: No acute cardiopulmonary abnormality.   Original Report Authenticated By: Erskine Speed, M.D.   Ct Head Wo Contrast  09/11/2012   *RADIOLOGY REPORT*  Clinical Data: 79 year old male with weakness.  Femoral neck fracture of unknown etiology.  Decreased communication.  CT HEAD WITHOUT CONTRAST  Technique:  Contiguous axial images were obtained from the base of the skull through the vertex without contrast.  Comparison: 03/18/2012.  Findings: Visualized paranasal sinuses and mastoids are clear.  No scalp hematoma.  No acute orbit soft tissue findings. No acute osseous abnormality identified.  Extensive calcified atherosclerosis at the skull base.  Stable cerebral volume.  No ventriculomegaly. No midline shift, mass effect, or evidence of mass lesion.  No suspicious intracranial vascular hyperdensity.  Mild for age white matter hypodensity. No evidence of cortically based acute infarction identified.  No acute intracranial hemorrhage identified.  IMPRESSION: No acute intracranial abnormality.  Except for a advanced skull base atherosclerosis, largely unremarkable for age noncontrast CT appearance of the brain.   Original Report Authenticated By: Erskine Speed, M.D.     Date: 09/11/2012  Rate: 60  Rhythm: normal sinus rhythm and premature atrial contractions (PAC)  QRS Axis: normal  Intervals: normal  ST/T Wave abnormalities: nonspecific ST/T changes  Conduction Disutrbances:nonspecific intraventricular conduction delay and first degree av block  Narrative Interpretation:   Old EKG Reviewed: unchanged   1. Suicidal ideation       MDM   Patient sent by his nursing facility due to suicidal ideation. Currently  patient is denying anything here however the longer he talks he does appear to be paranoid. Per his son he has had suicidal ideation in the past and attempted to act on it. Patient currently denies suicidal ideation a.m. per nursing home has been in his normal state of health. He currently has no complaints. Based on accusations by medical staff concern given patient's prior history) today. Meds clearance labs pending and ACT to come and evaluate the patient   6:39 PM Pt seen by telepsych and found to be psychiatrically stable for D/c.  No further meds recommended and felt he was not a danger to self or others.  Will d/c home.     Gwyneth Sprout, MD 09/11/12 1840  Gwyneth Sprout, MD 09/11/12 [phone]

## 2012-09-11 NOTE — BH Assessment (Signed)
Assessment Note   Russell [name] is an 79 y.o. male that presents via EMS from [facility].  Pt stated he does not know why he is here when assessed by this clinician. However, per nursing home today patient told the nurse that he wanted to die. He stated that he had a real knife he would slit his throat. She stated at that time they called EMS and the police and he told the police officer he had his gun he would put it to his temple and pulled the trigger. The nurse stated they spoke with his son who states he's had suicidal ideations in the past and attempted to act on them. However, pt denies this.  Pt's son was present with pt, but left before this assessment.  The pt stated that the facility is making all this up and putting words into his mouth to make him angry.  Pt stated someone started a rumor about him and "I don't want to talk about it anymore."  Pt appeared suspicious at times, stating the nursing home was trying to make him mad and that he was here at [facility] because the hospital wants money.  Pt also made jokes and was laughing.  Pt denies SI, HI or psychosis.  Pt stated he goes to [facility] for medical treatment and denies any past mental health treatment.  Pt was pleasant and cooperative during assessment.  Consulted with EDP Plunkett who agreed a telepsych warranted for further assessment and evaluation.  Pt pending telepsych.  Updated ED staff.   Axis I: 296.9 Mood Disorder NOS Axis II: Deferred Axis III:  Past Medical History  Diagnosis Date  . Stroke   . Acute bronchitis   . Hypertension    Axis IV: other psychosocial or environmental problems and problems related to social environment Axis V: 31-40 impairment in reality testing  Past Medical History:  Past Medical History  Diagnosis Date  . Stroke   . Acute bronchitis   . Hypertension     Past Surgical History  Procedure Laterality Date  . Hernia repair    . Lumbar disc surgery    . Hip arthroplasty Right  08/26/2012    Procedure: RIGHT HEMI HIP ARTHROPLASTY ;  Surgeon: Velna Ochs, MD;  Location: [facility];  Service: Orthopedics;  Laterality: Right;    Family History:  Family History  Problem Relation Age of Onset  . CVA Mother     Social History:  reports that he has never smoked. He has never used smokeless tobacco. He reports that he does not drink alcohol or use illicit drugs.  Additional Social History:  Alcohol / Drug Use Pain Medications: see MAR Prescriptions: see MAR Over the Counter: see MAR History of alcohol / drug use?:  (pt admits to ETOH use while in [place]) Longest period of sobriety (when/how long):  (unknown) Negative Consequences of Use:  (unknown) Withdrawal Symptoms:  (pt denies)  CIWA: CIWA-Ar BP: 172/75 mmHg Pulse Rate: 58 COWS:    Allergies:  Allergies  Allergen Reactions  . Ambien (Zolpidem Tartrate) Other (See Comments)    Unknown reaction  . Penicillins Other (See Comments)    Unknown reaction    Home Medications:  (Not in a hospital admission)  OB/GYN Status:  No LMP for male patient.  General Assessment Data Location of Assessment: [facility] ED Living Arrangements: Other (Comment) (Lives in [facility]) Can pt return to current living arrangement?: Yes Admission Status: Voluntary Is patient capable of  signing voluntary admission?: Yes Transfer from: Acute Hospital Referral Source: Other (Nursing home)  Education Status Is patient currently in school?: No  Risk to self Suicidal Ideation: No (per nursing home, he stated he was going to cut or shoot sel) Suicidal Intent: No Is patient at risk for suicide?:  (pt denies) Suicidal Plan?: No (Reportedly told staff he was going to cut self with knife or) Access to Means: No (not in ED) What has been your use of drugs/alcohol within the last 12 months?: pt denies Previous Attempts/Gestures:  (pt denies) How many times?:  (pt denies) Other Self Harm Risks: pt denies Triggers for Past  Attempts: None known Intentional Self Injurious Behavior: None Family Suicide History: Unknown Recent stressful life event(s): Other (Comment) (pt denies) Persecutory voices/beliefs?: No (pt denies) Depression: No (pt denies) Depression Symptoms:  (pt denies) Substance abuse history and/or treatment for substance abuse?: No Suicide prevention information given to non-admitted patients: Not applicable  Risk to Others Homicidal Ideation: No Thoughts of Harm to Others: No Current Homicidal Intent: No Current Homicidal Plan: No Access to Homicidal Means: No Identified Victim: pt denies History of harm to others?: No (pt denies) Assessment of Violence: None Noted Violent Behavior Description: pt calm, cooperative during assessment Does patient have access to weapons?: No Criminal Charges Pending?: No Does patient have a court date: No  Psychosis Hallucinations: None noted Delusions: None noted  Mental Status Report Appear/Hygiene: Disheveled Eye Contact: Fair Motor Activity: Freedom of movement;Unremarkable Speech: Logical/coherent Level of Consciousness: Alert Mood: Suspicious Affect: Apprehensive Anxiety Level: Minimal Thought Processes: Coherent;Relevant Judgement: Unimpaired Orientation: Person;Place Obsessive Compulsive Thoughts/Behaviors: None  Cognitive Functioning Concentration: Normal Memory: Recent Intact;Remote Intact IQ: Average Insight: Fair Impulse Control: Fair Appetite: Good Weight Loss: 0 Weight Gain: 0 Sleep: No Change Total Hours of Sleep:  (6-8 hrs per night) Vegetative Symptoms: None  ADLScreening [facility] Assessment Services) Patient's cognitive ability adequate to safely complete daily activities?: No Patient able to express need for assistance with ADLs?: Yes Independently performs ADLs?: No  Abuse/Neglect [facility]) Physical Abuse: Denies Verbal Abuse: Denies Sexual Abuse: Denies  Prior Inpatient Therapy Prior Inpatient Therapy: No Prior  Therapy Dates: na Prior Therapy Facilty/Provider(s): na Reason for Treatment: na  Prior Outpatient Therapy Prior Outpatient Therapy: No Prior Therapy Dates: na Prior Therapy Facilty/Provider(s): na Reason for Treatment: na  ADL Screening (condition at time of admission) Patient's cognitive ability adequate to safely complete daily activities?: No Patient able to express need for assistance with ADLs?: Yes Independently performs ADLs?: No Communication: Independent Is this a change from baseline?: Pre-admission baseline Dressing (OT): Needs assistance Is this a change from baseline?: Change from baseline, expected to last >3 days Grooming: Needs assistance Is this a change from baseline?: Pre-admission baseline Feeding: Independent Bathing: Needs assistance Is this a change from baseline?: Pre-admission baseline Toileting: Needs assistance Is this a change from baseline?: Change from baseline, expected to last >3days In/Out Bed: Needs assistance Is this a change from baseline?: Change from baseline, expected to last >3 days Walks in Home: Needs assistance Is this a change from baseline?: Change from baseline, expected to last >3 days       Abuse/Neglect Assessment (Assessment to be complete while patient is alone) Physical Abuse: Denies Verbal Abuse: Denies Sexual Abuse: Denies Exploitation of patient/patient's resources: Denies Self-Neglect: Denies Values / Beliefs Cultural Requests During Hospitalization: None Spiritual Requests During Hospitalization: None Consults Spiritual Care Consult Needed: No Social Work Consult Needed: No Merchant navy officer (For Healthcare) Advance Directive: Patient has  advance directive, copy not in chart Type of Advance Directive: Healthcare Power of [place];Living will Advance Directive not in Chart: Copy requested from family    Additional Information 1:1 In Past 12 Months?: No CIRT Risk: No Elopement Risk: No Does patient have  medical clearance?: Yes     Disposition:  Disposition Initial Assessment Completed for this Encounter: Yes Disposition of Patient: Other dispositions Other disposition(s): Other (Comment) (Pending telepsych)  On Site Evaluation by:   Reviewed with Physician:  Juneau Callas, Rennis Harding 09/11/2012 4:07 PM

## 2012-09-11 NOTE — ED Notes (Addendum)
Son at bedside, speaking with Dr. Anitra Lauth.  He was informed what nursing facility staff heard pt say, per staff, he said if he had a knife, he would cut his throat; police called to facility, pt told officer if he had his gun, he would put it to his temple and shoot himself.  Pt denies saying anything, claims to not remember incident.

## 2012-09-11 NOTE — ED Notes (Signed)
Pt screaming out "I'm frozen, I'm frozen".  Pt inquiring when he can go home, pt instructed to await lab results.  Additional warm blankets given for pt's comfort.

## 2012-09-11 NOTE — ED Notes (Signed)
Pt st's he wants [place] to call an ambulance to transport him back to where he was picked up from.  St's he does not need to be here.  I ask pt if he was hungry, st's yes   Dinner tray ordered for pt. Per VO Dr. Anitra Lauth.  Pt made aware of same.

## 2012-09-11 NOTE — ED Notes (Signed)
Pt transported to and from radiology on stretcher with tech, tolerated well.  

## 2012-09-11 NOTE — ED Notes (Signed)
Report called to [facility] and given to [place].  [facility] called to transport pt back to facility.  Pt informed of plan of care.

## 2012-09-12 NOTE — Progress Notes (Signed)
Date: 09/12/2012  MRN:  [id number] Name:  Russell [name] Sex:  male Age:  79 y.o. DOB:04/21/1916                         Facility/Room; [facility] Level Of Care:SNF Provider: Dr. Murray Hodgkins  Emergency Contacts: Contact Information   Name Relation Home Work Mobile   Tait, Balistreri Son [phone]  [phone]      Code Status:DNR MOST Form:  Allergies: Allergies  Allergen Reactions  . Ambien (Zolpidem Tartrate) Other (See Comments)    Unknown reaction  . Penicillins Other (See Comments)    Unknown reaction     Chief Complaint  Patient presents with  . Medical Managment of Chronic Issues    New admit to SNF following hospitalization for fracture right hip     HPI: 08/25/2012 Patient is a 79 y.o. male presenting with fall. The history is provided by the patient. The accident occurred 1 to 2 hours ago. Pertinent negatives include no fever and no headaches.  patient lost his balance and fell backwards, hitting his head. No loss of consciousness. Patient states people were immediately all around him. No other complaints. No headache. No numbness weakness. No neck pain. Is not on blood thinners. No chest pain. No abdominal pain. His right elbow distress from a "spider bite". 3 cm laceration to left occipital area. On 08/26/2012 Had right hip femoral neck fracture surgery by Dr. Jerl Santos. Patient was discharged 08/29/2012 to [facility] for rehab.     Past Medical History  Diagnosis Date  . Stroke   . Acute bronchitis   . Hypertension     Past Surgical History  Procedure Laterality Date  . Hernia repair    . Lumbar disc surgery    . Hip arthroplasty Right 08/26/2012    Procedure: RIGHT HEMI HIP ARTHROPLASTY ;  Surgeon: Velna Ochs, MD;  Location: [facility] ORS;  Service: Orthopedics;  Laterality: Right;     Procedures: 08/25/2012  RIGHT RIBS AND CHEST -  IMPRESSION: No rib fracture is identified. No diagnostic pneumothorax.  08/25/2012 RIGHT HIP -  IMPRESSION:  Mild displaced fracture of the right femoral neck. Diffuse osteopenia. Per CMS PQRS reporting requirements (PQRS Measure 24): Given the patient's age of greater than 50 and the fracture site (hip, distal radius, or spine), the patient should be tested for osteoporosis using DXA, and the appropriate treatment considered based on the DXA results.   08/25/2012  RIGHT KNEE - 1-2 VIEW  IMPRESSION: No acute fracture or subluxation. Diffuse osteopenia. 08/25/2012 PORTABLE CHEST - 1 VIEW . IMPRESSION: No active disease. No diagnostic pneumothorax. 08/26/2012  PORTABLE PELVIS . IMPRESSION: 1. Postoperative changes of right-sided hip arthroplasty without acute complicating features, as above.   .  Consultants:  Dr. Jerl Santos Orthopedic   Current Outpatient Prescriptions  Medication Sig Dispense Refill  . acetaminophen (TYLENOL) 325 MG tablet Take 650 mg by mouth every 6 (six) hours as needed. For pain.      Marland Kitchen aspirin EC 325 MG tablet Take 1 tablet (325 mg total) by mouth 2 (two) times daily.  60 tablet  0  . bisacodyl (DULCOLAX) 10 MG suppository Place 10 mg rectally as needed for constipation (constipation).      . cholecalciferol (VITAMIN D) 1000 UNITS tablet Take 2,000 Units by mouth daily.      . diazepam (VALIUM) 5 MG tablet Take 1 tablet (5 mg total) by mouth every 12 (twelve) hours as needed. Take  5mg  daily as needed for anxiety and take 5mg  every night at bedtime.  30 tablet  0  . Ensure Plus (ENSURE PLUS) LIQD Take 237 mLs by mouth.      . finasteride (PROSCAR) 5 MG tablet Take 5 mg by mouth at bedtime.      Marland Kitchen HYDROcodone-acetaminophen (NORCO) 5-325 MG per tablet Take 1 tablet by mouth every 6 (six) hours as needed for pain.  30 tablet  0  . omeprazole (PRILOSEC) 40 MG capsule Take 40 mg by mouth daily before breakfast.      . polyethylene glycol (MIRALAX / GLYCOLAX) packet Take 17 g by mouth daily as needed.  14 each  0  . ranitidine (ZANTAC) 300 MG tablet Take 300 mg by mouth at bedtime.      .  senna-docusate (SENOKOT-S) 8.6-50 MG per tablet Take 1 tablet by mouth 2 (two) times daily.      . sertraline (ZOLOFT) 100 MG tablet Take 100 mg by mouth daily.      . sorbitol 70 % solution Take 5 mLs by mouth daily.      . Tamsulosin HCl (FLOMAX) 0.4 MG CAPS Take 0.4 mg by mouth daily after supper.       No current facility-administered medications for this visit.     There is no immunization history on file for this patient.   Diet:  History  Substance Use Topics  . Smoking status: Never Smoker   . Smokeless tobacco: Never Used  . Alcohol Use: No    Family History  Problem Relation Age of Onset  . CVA Mother        Vital signs: BP 114/71  Pulse 64  Resp 23  Ht 5\' 10"  (1.778 m)  Wt 154 lb 3.2 oz (69.945 kg)  BMI 22.13 kg/m2  General Appearance:    Alert, cooperative, no distress, appears stated age  Head:    Normocephalic, without obvious abnormality, atraumatic  Eyes:    PERRL, conjunctiva/corneas clear, EOM's intact, fundi    benign, both eyes       Ears:    Normal TM's and external ear canals, both ears  Nose:   Nares normal, septum midline, mucosa normal, no drainage   or sinus tenderness  Throat:   Lips, mucosa, and tongue normal; teeth and gums normal  Neck:   Supple, symmetrical, trachea midline, no adenopathy;       thyroid:  No enlargement/tenderness/nodules; no carotid   bruit or JVD  Back:     Symmetric, no curvature, ROM normal, no CVA tenderness  Lungs:     Clear to auscultation bilaterally, respirations unlabored  Chest wall:    No tenderness or deformity  Heart:    Regular rate and rhythm, S1 and S2 normal, no murmur, rub   or gallop  Abdomen:     Soft, non-tender, bowel sounds active all four quadrants,    no masses, no organomegaly  Genitalia:    Normal male without lesion, discharge or tenderness  Rectal:    Normal tone, normal prostate, no masses or tenderness;   guaiac negative stool  Extremities:   Extremities normal, atraumatic, no  cyanosis or edema  Pulses:   2+ and symmetric all extremities  Skin:   Skin color, texture, turgor normal, no rashes or lesions  Lymph nodes:   Cervical, supraclavicular, and axillary nodes normal  Neurologic:   CNII-XII intact. Normal strength, sensation and reflexes      throughout    Screening Score  MMS    PHQ2    PHQ9     Fall Risk    BIMS    Admission on 09/11/2012, Discharged on 09/11/2012  Component Date Value Range Status  . WBC 09/11/2012 5.7  4.0 - 10.5 K/uL Final  . RBC 09/11/2012 3.34* 4.22 - 5.81 MIL/uL Final  . Hemoglobin 09/11/2012 9.7* 13.0 - 17.0 g/dL Final  . HCT 16/01/9603 28.3* 39.0 - 52.0 % Final  . MCV 09/11/2012 84.7  78.0 - 100.0 fL Final  . MCH 09/11/2012 29.0  26.0 - 34.0 pg Final  . MCHC 09/11/2012 34.3  30.0 - 36.0 g/dL Final  . RDW 54/12/8117 16.6* 11.5 - 15.5 % Final  . Platelets 09/11/2012 373  150 - 400 K/uL Final  . Neutrophils Relative % 09/11/2012 47  43 - 77 % Final  . Neutro Abs 09/11/2012 2.7  1.7 - 7.7 K/uL Final  . Lymphocytes Relative 09/11/2012 33  12 - 46 % Final  . Lymphs Abs 09/11/2012 1.9  0.7 - 4.0 K/uL Final  . Monocytes Relative 09/11/2012 18* 3 - 12 % Final  . Monocytes Absolute 09/11/2012 1.0  0.1 - 1.0 K/uL Final  . Eosinophils Relative 09/11/2012 2  0 - 5 % Final  . Eosinophils Absolute 09/11/2012 0.1  0.0 - 0.7 K/uL Final  . Basophils Relative 09/11/2012 0  0 - 1 % Final  . Basophils Absolute 09/11/2012 0.0  0.0 - 0.1 K/uL Final  . Sodium 09/11/2012 133* 135 - 145 mEq/L Final  . Potassium 09/11/2012 3.4* 3.5 - 5.1 mEq/L Final  . Chloride 09/11/2012 101  96 - 112 mEq/L Final  . CO2 09/11/2012 20  19 - 32 mEq/L Final  . Glucose, Bld 09/11/2012 100* 70 - 99 mg/dL Final  . BUN 14/78/2956 28* 6 - 23 mg/dL Final  . Creatinine, Ser 09/11/2012 1.22  0.50 - 1.35 mg/dL Final  . Calcium 21/30/8657 8.6  8.4 - 10.5 mg/dL Final  . GFR calc non Af Amer 09/11/2012 49* >90 mL/min Final  . GFR calc Af Amer 09/11/2012 57* >90 mL/min  Final   Comment:                                 The eGFR has been calculated                          using the CKD EPI equation.                          This calculation has not been                          validated in all clinical                          situations.                          eGFR's persistently                          <90 mL/min signify                          possible Chronic Kidney Disease.  Marland Kitchen  Alcohol, Ethyl (B) 09/11/2012 <11  0 - 11 mg/dL Final   Comment:                                 LOWEST DETECTABLE LIMIT FOR                          SERUM ALCOHOL IS 11 mg/dL                          FOR MEDICAL PURPOSES ONLY  Admission on 08/25/2012, Discharged on 08/29/2012  Component Date Value Range Status  . WBC 08/25/2012 6.9  4.0 - 10.5 K/uL Final  . RBC 08/25/2012 3.32* 4.22 - 5.81 MIL/uL Final  . Hemoglobin 08/25/2012 9.9* 13.0 - 17.0 g/dL Final  . HCT 47/82/9562 29.0* 39.0 - 52.0 % Final  . MCV 08/25/2012 87.3  78.0 - 100.0 fL Final  . MCH 08/25/2012 29.8  26.0 - 34.0 pg Final  . MCHC 08/25/2012 34.1  30.0 - 36.0 g/dL Final  . RDW 13/11/6576 14.6  11.5 - 15.5 % Final  . Platelets 08/25/2012 178  150 - 400 K/uL Final  . Neutrophils Relative % 08/25/2012 77  43 - 77 % Final  . Neutro Abs 08/25/2012 5.3  1.7 - 7.7 K/uL Final  . Lymphocytes Relative 08/25/2012 12  12 - 46 % Final  . Lymphs Abs 08/25/2012 0.8  0.7 - 4.0 K/uL Final  . Monocytes Relative 08/25/2012 8  3 - 12 % Final  . Monocytes Absolute 08/25/2012 0.6  0.1 - 1.0 K/uL Final  . Eosinophils Relative 08/25/2012 2  0 - 5 % Final  . Eosinophils Absolute 08/25/2012 0.1  0.0 - 0.7 K/uL Final  . Basophils Relative 08/25/2012 0  0 - 1 % Final  . Basophils Absolute 08/25/2012 0.0  0.0 - 0.1 K/uL Final  . Sodium 08/25/2012 138  135 - 145 mEq/L Final  . Potassium 08/25/2012 3.4* 3.5 - 5.1 mEq/L Final  . Chloride 08/25/2012 100  96 - 112 mEq/L Final  . CO2 08/25/2012 26  19 - 32 mEq/L Final  . Glucose,  Bld 08/25/2012 128* 70 - 99 mg/dL Final  . BUN 46/96/2952 21  6 - 23 mg/dL Final  . Creatinine, Ser 08/25/2012 1.50* 0.50 - 1.35 mg/dL Final  . Calcium 84/13/2440 8.8  8.4 - 10.5 mg/dL Final  . GFR calc non Af Amer 08/25/2012 38* >90 mL/min Final  . GFR calc Af Amer 08/25/2012 44* >90 mL/min Final   Comment:                                 The eGFR has been calculated                          using the CKD EPI equation.                          This calculation has not been                          validated in all clinical  situations.                          eGFR's persistently                          <90 mL/min signify                          possible Chronic Kidney Disease.  Marland Kitchen Prothrombin Time 08/25/2012 14.2  11.6 - 15.2 seconds Final  . INR 08/25/2012 1.11  0.00 - 1.49 Final  . Sodium 08/26/2012 139  135 - 145 mEq/L Final  . Potassium 08/26/2012 3.2* 3.5 - 5.1 mEq/L Final  . Chloride 08/26/2012 104  96 - 112 mEq/L Final  . CO2 08/26/2012 24  19 - 32 mEq/L Final  . Glucose, Bld 08/26/2012 136* 70 - 99 mg/dL Final  . BUN 30/86/5784 21  6 - 23 mg/dL Final  . Creatinine, Ser 08/26/2012 1.52* 0.50 - 1.35 mg/dL Final  . Calcium 69/62/9528 8.4  8.4 - 10.5 mg/dL Final  . GFR calc non Af Amer 08/26/2012 38* >90 mL/min Final  . GFR calc Af Amer 08/26/2012 44* >90 mL/min Final   Comment:                                 The eGFR has been calculated                          using the CKD EPI equation.                          This calculation has not been                          validated in all clinical                          situations.                          eGFR's persistently                          <90 mL/min signify                          possible Chronic Kidney Disease.  . WBC 08/26/2012 4.7  4.0 - 10.5 K/uL Final  . RBC 08/26/2012 3.04* 4.22 - 5.81 MIL/uL Final  . Hemoglobin 08/26/2012 9.0* 13.0 - 17.0 g/dL Final  . HCT 41/32/4401 26.6* 39.0  - 52.0 % Final  . MCV 08/26/2012 87.5  78.0 - 100.0 fL Final  . MCH 08/26/2012 29.6  26.0 - 34.0 pg Final  . MCHC 08/26/2012 33.8  30.0 - 36.0 g/dL Final  . RDW 02/72/5366 14.9  11.5 - 15.5 % Final  . Platelets 08/26/2012 171  150 - 400 K/uL Final  . ABO/RH(D) 08/26/2012 O POS   Final  . Antibody Screen 08/26/2012 NEG   Final  . Sample Expiration 08/26/2012 08/29/2012   Final  . Unit Number 08/26/2012 [id number]   Final  . Blood Component Type 08/26/2012 RBC LR PHER1  Final  . Unit division 08/26/2012 00   Final  . Status of Unit 08/26/2012 ISSUED,FINAL   Final  . Transfusion Status 08/26/2012 OK TO TRANSFUSE   Final  . Crossmatch Result 08/26/2012 Compatible   Final  . Unit Number 08/26/2012 [id number]   Final  . Blood Component Type 08/26/2012 RBC LR PHER1   Final  . Unit division 08/26/2012 00   Final  . Status of Unit 08/26/2012 ISSUED,FINAL   Final  . Transfusion Status 08/26/2012 OK TO TRANSFUSE   Final  . Crossmatch Result 08/26/2012 Compatible   Final  . MRSA, PCR 08/26/2012 POSITIVE* NEGATIVE Final   Comment: RESULT CALLED TO, READ BACK BY AND VERIFIED WITH:                          [id number]  . Staphylococcus aureus 08/26/2012 POSITIVE* NEGATIVE Final   Comment:                                 The Xpert SA Assay (FDA                          approved for NASAL specimens                          in patients over 49 years of age),                          is one component of                          a comprehensive surveillance                          program.  Test performance has                          been validated by [employer] for patients greater                          than or equal to 26 year old.                          It is not intended                          to diagnose infection nor to                          guide or monitor treatment.                          RESULT CALLED TO, READ BACK BY AND  VERIFIED WITH:                          [id number]  . ABO/RH(D) 08/26/2012 O POS   Final  . WBC 08/27/2012 6.2  4.0 - 10.5 K/uL  Final  . RBC 08/27/2012 2.60* 4.22 - 5.81 MIL/uL Final  . Hemoglobin 08/27/2012 7.7* 13.0 - 17.0 g/dL Final  . HCT 56/21/3086 23.0* 39.0 - 52.0 % Final  . MCV 08/27/2012 88.5  78.0 - 100.0 fL Final  . MCH 08/27/2012 29.6  26.0 - 34.0 pg Final  . MCHC 08/27/2012 33.5  30.0 - 36.0 g/dL Final  . RDW 57/84/6962 15.2  11.5 - 15.5 % Final  . Platelets 08/27/2012 135* 150 - 400 K/uL Final  . Vitamin B-12 08/27/2012 441  211 - 911 pg/mL Final  . Folate 08/27/2012 15.8   Final   Comment: (NOTE)                          Reference Ranges                                 Deficient:       0.4 - 3.3 ng/mL                                 Indeterminate:   3.4 - 5.4 ng/mL                                 Normal:              > 5.4 ng/mL  . Iron 08/27/2012 <10* 42 - 135 ug/dL Final  . TIBC 95/28/4132 Not calculated due to Iron <10.  215 - 435 ug/dL Final  . Saturation Ratios 08/27/2012 Not calculated due to Iron <10.  20 - 55 % Final  . UIBC 08/27/2012 149  125 - 400 ug/dL Final  . Ferritin 44/04/270 1168* 22 - 322 ng/mL Final  . Retic Ct Pct 08/27/2012 0.9  0.4 - 3.1 % Final  . RBC. 08/27/2012 2.60* 4.22 - 5.81 MIL/uL Final  . Retic Count, Manual 08/27/2012 23.4  19.0 - 186.0 K/uL Final  . Sodium 08/27/2012 138  135 - 145 mEq/L Final  . Potassium 08/27/2012 3.0* 3.5 - 5.1 mEq/L Final  . Chloride 08/27/2012 103  96 - 112 mEq/L Final  . CO2 08/27/2012 23  19 - 32 mEq/L Final  . Glucose, Bld 08/27/2012 164* 70 - 99 mg/dL Final  . BUN 53/66/4403 20  6 - 23 mg/dL Final  . Creatinine, Ser 08/27/2012 1.44* 0.50 - 1.35 mg/dL Final  . Calcium 47/42/5956 8.1* 8.4 - 10.5 mg/dL Final  . GFR calc non Af Amer 08/27/2012 40* >90 mL/min Final  . GFR calc Af Amer 08/27/2012 47* >90 mL/min Final   Comment:                                 The eGFR has been calculated                           using the CKD EPI equation.                          This calculation has not been  validated in all clinical                          situations.                          eGFR's persistently                          <90 mL/min signify                          possible Chronic Kidney Disease.  . WBC 08/28/2012 9.3  4.0 - 10.5 K/uL Final  . RBC 08/28/2012 2.37* 4.22 - 5.81 MIL/uL Final  . Hemoglobin 08/28/2012 7.1* 13.0 - 17.0 g/dL Final  . HCT 19/14/7829 21.1* 39.0 - 52.0 % Final  . MCV 08/28/2012 89.0  78.0 - 100.0 fL Final  . MCH 08/28/2012 30.0  26.0 - 34.0 pg Final  . MCHC 08/28/2012 33.6  30.0 - 36.0 g/dL Final  . RDW 56/21/3086 15.7* 11.5 - 15.5 % Final  . Platelets 08/28/2012 111* 150 - 400 K/uL Final   Comment: SPECIMEN CHECKED FOR CLOTS                          REPEATED TO VERIFY                          PLATELET COUNT CONFIRMED BY SMEAR  . Sodium 08/28/2012 135  135 - 145 mEq/L Final  . Potassium 08/28/2012 3.9  3.5 - 5.1 mEq/L Final   Comment: RESULT REPEATED AND VERIFIED                          DELTA CHECK NOTED  . Chloride 08/28/2012 103  96 - 112 mEq/L Final  . CO2 08/28/2012 22  19 - 32 mEq/L Final  . Glucose, Bld 08/28/2012 131* 70 - 99 mg/dL Final  . BUN 57/84/6962 25* 6 - 23 mg/dL Final  . Creatinine, Ser 08/28/2012 1.58* 0.50 - 1.35 mg/dL Final  . Calcium 95/28/4132 8.1* 8.4 - 10.5 mg/dL Final  . GFR calc non Af Amer 08/28/2012 36* >90 mL/min Final  . GFR calc Af Amer 08/28/2012 42* >90 mL/min Final   Comment:                                 The eGFR has been calculated                          using the CKD EPI equation.                          This calculation has not been                          validated in all clinical                          situations.                          eGFR's persistently                          <  90 mL/min signify                          possible Chronic Kidney Disease.  .  Order Confirmation 08/28/2012 ORDER PROCESSED BY BLOOD BANK   Final  . WBC 08/29/2012 7.0  4.0 - 10.5 K/uL Final  . RBC 08/29/2012 3.27* 4.22 - 5.81 MIL/uL Final  . Hemoglobin 08/29/2012 9.5* 13.0 - 17.0 g/dL Final   Comment: REPEATED TO VERIFY                          DELTA CHECK NOTED                          POST TRANSFUSION SPECIMEN  . HCT 08/29/2012 27.6* 39.0 - 52.0 % Final  . MCV 08/29/2012 84.4  78.0 - 100.0 fL Final  . MCH 08/29/2012 29.1  26.0 - 34.0 pg Final  . MCHC 08/29/2012 34.4  30.0 - 36.0 g/dL Final  . RDW 04/54/0981 17.5* 11.5 - 15.5 % Final  . Platelets 08/29/2012 150  150 - 400 K/uL Final   Comment: REPEATED TO VERIFY                          DELTA CHECK NOTED  . Sodium 08/29/2012 134* 135 - 145 mEq/L Final  . Potassium 08/29/2012 4.3  3.5 - 5.1 mEq/L Final  . Chloride 08/29/2012 102  96 - 112 mEq/L Final  . CO2 08/29/2012 23  19 - 32 mEq/L Final  . Glucose, Bld 08/29/2012 126* 70 - 99 mg/dL Final  . BUN 19/14/7829 28* 6 - 23 mg/dL Final  . Creatinine, Ser 08/29/2012 1.40* 0.50 - 1.35 mg/dL Final  . Calcium 56/21/3086 8.3* 8.4 - 10.5 mg/dL Final  . GFR calc non Af Amer 08/29/2012 42* >90 mL/min Final  . GFR calc Af Amer 08/29/2012 48* >90 mL/min Final   Comment:                                 The eGFR has been calculated                          using the CKD EPI equation.                          This calculation has not been                          validated in all clinical                          situations.                          eGFR's persistently                          <90 mL/min signify                          possible Chronic Kidney Disease.     Annual summary: Hospitalizations: Infection History:  Functional assessment: Areas of potential improvement:  Rehabilitation Potential: Prognosis for survival: Plan:  This encounter was created in error - please disregard.

## 2012-09-29 ENCOUNTER — Other Ambulatory Visit: Payer: Self-pay | Admitting: *Deleted

## 2012-09-29 MED ORDER — DIAZEPAM 5 MG PO TABS: ORAL_TABLET | ORAL | Status: DC

## 2012-10-01 ENCOUNTER — Non-Acute Institutional Stay (SKILLED_NURSING_FACILITY): Payer: Medicare Other | Admitting: Nurse Practitioner

## 2012-10-01 ENCOUNTER — Encounter: Payer: Self-pay | Admitting: Nurse Practitioner

## 2012-10-01 DIAGNOSIS — K5900 Constipation, unspecified: Secondary | ICD-10-CM

## 2012-10-01 DIAGNOSIS — R404 Transient alteration of awareness: Secondary | ICD-10-CM

## 2012-10-01 DIAGNOSIS — F411 Generalized anxiety disorder: Secondary | ICD-10-CM

## 2012-10-01 DIAGNOSIS — R410 Disorientation, unspecified: Secondary | ICD-10-CM

## 2012-10-01 DIAGNOSIS — G4700 Insomnia, unspecified: Secondary | ICD-10-CM

## 2012-10-01 NOTE — Progress Notes (Signed)
Patient ID: Russell [name], male   DOB: Mar 02, 1917, 79 y.o.   MRN: [id number]  Nursing Home Location:  [facility]   Place of Service: SNF (31)   Chief Complaint: acute visit  HPI:  79 year old male with pmh of right hip fracture who is at [facility] for rehab has been increasingly more confused and combative. Pt also with constipation- recently added miralax which has helped per nursing. Nursing reports pt has had multiple falls trying to get to the bathroom and not waiting for someone to come to assist him. Pt confused and unable to contribute to HPI or ROS   Review of Systems:  Review of Systems  Unable to perform ROS: dementia    Medications: Patient's Medications  New Prescriptions   No medications on file  Previous Medications   ACETAMINOPHEN (TYLENOL) 325 MG TABLET    Take 650 mg by mouth every 6 (six) hours as needed. For pain.   BISACODYL (DULCOLAX) 10 MG SUPPOSITORY    Place 10 mg rectally as needed for constipation (constipation).   CHOLECALCIFEROL (VITAMIN D) 1000 UNITS TABLET    Take 2,000 Units by mouth daily.   DIAZEPAM (VALIUM) 5 MG TABLET    Take one tablet by mouth at bedtime; Take one tablet by mouth every 12 hours as needed for anxiety   ENSURE PLUS (ENSURE PLUS) LIQD    Take 237 mLs by mouth.   FINASTERIDE (PROSCAR) 5 MG TABLET    Take 5 mg by mouth at bedtime.   HYDROCODONE-ACETAMINOPHEN (NORCO) 5-325 MG PER TABLET    Take 1 tablet by mouth every 6 (six) hours as needed for pain.   OMEPRAZOLE (PRILOSEC) 40 MG CAPSULE    Take 40 mg by mouth daily before breakfast.   RANITIDINE (ZANTAC) 300 MG TABLET    Take 300 mg by mouth at bedtime.   SENNA-DOCUSATE (SENOKOT-S) 8.6-50 MG PER TABLET    Take 1 tablet by mouth 2 (two) times daily.   SERTRALINE (ZOLOFT) 100 MG TABLET    Take 100 mg by mouth daily.   SORBITOL 70 % SOLUTION    Take 5 mLs by mouth daily.   TAMSULOSIN HCL (FLOMAX) 0.4 MG CAPS    Take 0.4 mg by mouth daily after supper.  Modified  Medications   Modified Medication Previous Medication   POLYETHYLENE GLYCOL (MIRALAX / GLYCOLAX) PACKET polyethylene glycol (MIRALAX / GLYCOLAX) packet      Take 17 g by mouth daily.    Take 17 g by mouth daily as needed.  Discontinued Medications   No medications on file     Physical Exam:  Filed Vitals:   10/01/12 1427  BP: 112/54  Pulse: 70  Temp: 97 F (36.1 C)  Resp: 18    Physical Exam  Constitutional: He is well-developed, well-nourished, and in no distress. No distress.  HENT:  Head: Normocephalic and atraumatic.  Eyes: Conjunctivae and EOM are normal. Pupils are equal, round, and reactive to light.  Neck: Normal range of motion. Neck supple. No thyromegaly present.  Cardiovascular: Normal rate, regular rhythm and normal heart sounds.   Pulmonary/Chest: Effort normal and breath sounds normal.  Abdominal: Soft. Bowel sounds are normal. He exhibits no distension.  Musculoskeletal: Normal range of motion.  Neurological: He is alert.  Skin: Skin is warm and dry. He is not diaphoretic.       Assessment/Plan  Delirium- Will get UA C&S , cbc with diff, bmp  Using valium to help sleep  and in the morning for anxiety  Taking valium which staff reports is not working- will slowly titrate this medication off Anxiety-Will give PRN xanax 0.25 q 6 hours as needed anxiety  Insomnia- will add trazodone 50 mg q hs for sleep  Constipation- improved with addition of miralax daily will cont this medication and hold for diarrhea

## 2012-10-02 ENCOUNTER — Other Ambulatory Visit: Payer: Self-pay | Admitting: *Deleted

## 2012-10-02 MED ORDER — DIAZEPAM 2 MG PO TABS: ORAL_TABLET | ORAL | Status: DC

## 2012-10-02 MED ORDER — ALPRAZOLAM 0.25 MG PO TABS: ORAL_TABLET | ORAL | Status: DC

## 2012-10-14 ENCOUNTER — Non-Acute Institutional Stay (SKILLED_NURSING_FACILITY): Payer: Medicare Other | Admitting: Nurse Practitioner

## 2012-10-14 DIAGNOSIS — N19 Unspecified kidney failure: Secondary | ICD-10-CM

## 2012-10-14 DIAGNOSIS — D649 Anemia, unspecified: Secondary | ICD-10-CM

## 2012-10-14 DIAGNOSIS — R5381 Other malaise: Secondary | ICD-10-CM

## 2012-10-14 DIAGNOSIS — S72001S Fracture of unspecified part of neck of right femur, sequela: Secondary | ICD-10-CM

## 2012-10-14 DIAGNOSIS — R531 Weakness: Secondary | ICD-10-CM

## 2012-10-14 DIAGNOSIS — N400 Enlarged prostate without lower urinary tract symptoms: Secondary | ICD-10-CM

## 2012-10-14 DIAGNOSIS — S72009S Fracture of unspecified part of neck of unspecified femur, sequela: Secondary | ICD-10-CM

## 2012-10-26 NOTE — Progress Notes (Signed)
Patient ID: Russell [name], male   DOB: 07-Jun-1916, 79 y.o.   MRN: [id number]    Allergies  Allergen Reactions  . Ambien (Zolpidem Tartrate) Other (See Comments)    Unknown reaction  . Penicillins Other (See Comments)    Unknown reaction    Chief Complaint  Patient presents with  . Discharge Note    HPI:  79 year old male with a pmh of dementia, anemia, bph, renal failure, weakness who was unable to get OOB and was taken to ED where xrays showed femoral neck fracture. He was admitted to the hospital and underwent right hemi hip arthroplasty. Pt had post op Anemia, pts Hgb dropped after surgery he was transfused 2 units prbc and hgb improved to 9.5. During hospitalization after pts Foley catheter was removed and he continued to have high residual on the bladder scan, foley was reinserted. Pt currently on Flomax and Proscar without any complaints of difficulty voiding.Patient's creatinine level is at baseline at 1.40. DVT prophylaxis: aspirin 325 mg po BID x 4 weeks. Pt is currently at [facility] for STR and now he is ready to go back to his assisted living. While at [facility] pt had increased delirium with agitation however this as resolved. Patient currently doing well with therapy, now stable to discharge with home health.  Review of Systems:  Unable due to dementia   Past Medical History  Diagnosis Date  . Stroke   . Acute bronchitis   . Hypertension    Past Surgical History  Procedure Laterality Date  . Hernia repair    . Lumbar disc surgery    . Hip arthroplasty Right 08/26/2012    Procedure: RIGHT HEMI HIP ARTHROPLASTY ;  Surgeon: Velna Ochs, MD;  Location: [facility];  Service: Orthopedics;  Laterality: Right;   Social History:   reports that he has never smoked. He has never used smokeless tobacco. He reports that he does not drink alcohol or use illicit drugs.  Family History  Problem Relation Age of Onset  . CVA Mother     Medications: Patient's Medications  New  Prescriptions   No medications on file  Previous Medications   ACETAMINOPHEN (TYLENOL) 325 MG TABLET    Take 650 mg by mouth every 6 (six) hours as needed. For pain.   ALPRAZOLAM (XANAX) 0.25 MG TABLET    Take one tablet by mouth every 6 hours as needed for anxiety   BISACODYL (DULCOLAX) 10 MG SUPPOSITORY    Place 10 mg rectally as needed for constipation (constipation).   CHOLECALCIFEROL (VITAMIN D) 1000 UNITS TABLET    Take 2,000 Units by mouth daily.   DIAZEPAM (VALIUM) 2 MG TABLET    Take one tablet by mouth at bedtime times 7 day   DIAZEPAM (VALIUM) 5 MG TABLET    Take one tablet by mouth at bedtime; Take one tablet by mouth every 12 hours as needed for anxiety   ENSURE PLUS (ENSURE PLUS) LIQD    Take 237 mLs by mouth.   FINASTERIDE (PROSCAR) 5 MG TABLET    Take 5 mg by mouth at bedtime.   HYDROCODONE-ACETAMINOPHEN (NORCO) 5-325 MG PER TABLET    Take 1 tablet by mouth every 6 (six) hours as needed for pain.   OMEPRAZOLE (PRILOSEC) 40 MG CAPSULE    Take 40 mg by mouth daily before breakfast.   POLYETHYLENE GLYCOL (MIRALAX / GLYCOLAX) PACKET    Take 17 g by mouth daily.   RANITIDINE (ZANTAC) 300 MG TABLET  Take 300 mg by mouth at bedtime.   SENNA-DOCUSATE (SENOKOT-S) 8.6-50 MG PER TABLET    Take 1 tablet by mouth 2 (two) times daily.   SERTRALINE (ZOLOFT) 100 MG TABLET    Take 100 mg by mouth daily.   SORBITOL 70 % SOLUTION    Take 5 mLs by mouth daily.   TAMSULOSIN HCL (FLOMAX) 0.4 MG CAPS    Take 0.4 mg by mouth daily after supper.  Modified Medications   No medications on file  Discontinued Medications   No medications on file     Physical Exam:  Filed Vitals:   10/26/12 2203  BP: 122/72  Pulse: 66  Temp: 98.2 F (36.8 C)  Resp: 18   Physical Exam  Constitutional: He is well-developed, well-nourished, and in no distress. No distress.  HENT:  Head: Normocephalic and atraumatic.  Mouth/Throat: Oropharynx is clear and moist. No oropharyngeal exudate.  Eyes: Conjunctivae  and EOM are normal. Pupils are equal, round, and reactive to light.  Neck: Normal range of motion. Neck supple. No JVD present. No thyromegaly present.  Cardiovascular: Normal rate, regular rhythm and normal heart sounds.   Pulmonary/Chest: Effort normal and breath sounds normal. No respiratory distress. He has no wheezes.  Abdominal: Soft. Bowel sounds are normal. He exhibits no distension. There is no tenderness.  Musculoskeletal: Normal range of motion. He exhibits no edema.  Lymphadenopathy:    He has no cervical adenopathy.  Neurological: He is alert.  Skin: Skin is warm and dry. He is not diaphoretic.      Labs reviewed: Basic Metabolic Panel:  Recent Labs  45/40/98 0500 08/29/12 0834 09/11/12 1349  NA 135 134* 133*  K 3.9 4.3 3.4*  CL 103 102 101  CO2 22 23 20   GLUCOSE 131* 126* 100*  BUN 25* 28* 28*  CREATININE 1.58* 1.40* 1.22  CALCIUM 8.1* 8.3* 8.6   Liver Function Tests:  Recent Labs  02/06/12 0611  AST 10  ALT 8  ALKPHOS 36*  BILITOT 0.2*  PROT 6.2  ALBUMIN 2.6*   No results found for this basename: LIPASE, AMYLASE,  in the last 8760 hours No results found for this basename: AMMONIA,  in the last 8760 hours CBC:  Recent Labs  03/18/12 1235 08/25/12 1240  08/28/12 0500 08/29/12 0834 09/11/12 1349  WBC 3.5* 6.9  < > 9.3 7.0 5.7  NEUTROABS 2.1 5.3  --   --   --  2.7  HGB 9.9* 9.9*  < > 7.1* 9.5* 9.7*  HCT 28.0* 29.0*  < > 21.1* 27.6* 28.3*  MCV 85.1 87.3  < > 89.0 84.4 84.7  PLT 265 178  < > 111* 150 373  < > = values in this interval not displayed. Cardiac Enzymes: No results found for this basename: CKTOTAL, CKMB, CKMBINDEX, TROPONINI,  in the last 8760 hours BNP: No components found with this basename: POCBNP,  CBG:  Recent Labs  03/18/12 1318  GLUCAP 108*   Iron and IBC       Result: 10/03/2012 5:22 PM    ( Status: F )       C     Iron  41     L  42-165  ug/dL  SLN       UIBC  119        125-400  ug/dL  SLN       TIBC  147     L   215-435  ug/dL  SLN       %  SAT  23        20-55  %  SLN      Ferritin       Result: 10/03/2012 7:03 PM    ( Status: F )            Ferritin  1096     H  22-322  ng/mL  SLN        CBC with Diff       Result: 10/02/2012 2:19 PM    ( Status: F )       C     WBC  4.7        4.0-10.5  K/uL  SLN       RBC  2.67     L  4.22-5.81  MIL/uL  SLN       Hemoglobin  7.9     L  13.0-17.0  g/dL  SLN       Hematocrit  22.5     L  39.0-52.0  %  SLN       MCV  84.3        78.0-100.0  fL  SLN       MCH  29.6        26.0-34.0  pg  SLN       MCHC  35.1        30.0-36.0  g/dL  SLN       RDW  16.1     H  11.5-15.5  %  SLN       Platelet Count  221        150-400  K/uL  SLN       Granulocyte %  52        43-77  %  SLN       Absolute Gran  2.4        1.7-7.7  K/uL  SLN       Lymph %  35        12-46  %  SLN       Absolute Lymph  1.7        0.7-4.0  K/uL  SLN       Mono %  11        3-12  %  SLN       Absolute Mono  0.5        0.1-1.0  K/uL  SLN       Eos %  2        0-5  %  SLN       Absolute Eos  0.1        0.0-0.7  K/uL  SLN       Baso %  0        0-1  %  SLN       Absolute Baso  0.0        0.0-0.1  K/uL  SLN       Smear Review  Criteria for review not met   SLN      Comprehensive Metabolic Panel       Result: 10/02/2012 4:14 PM    ( Status: F )            Sodium  135        135-145  mEq/L  SLN       Potassium  3.3     L  3.5-5.3  mEq/L  SLN       Chloride  101  96-112  mEq/L  SLN       CO2  26        19-32  mEq/L  SLN       Glucose  126     H  70-99  mg/dL  SLN       BUN  25     H  6-23  mg/dL  SLN       Creatinine  1.51     H  0.50-1.35  mg/dL  SLN       Bilirubin, Total  0.3        0.3-1.2  mg/dL  SLN       Alkaline Phosphatase  54        39-117  U/L  SLN       AST/SGOT  12        0-37  U/L  SLN       ALT/SGPT  8        0-53  U/L  SLN       Total Protein  6.0        6.0-8.3  g/dL  SLN       Albumin  3.2     L  3.5-5.2  g/dL  SLN       Calcium  8.1     L  8.4-10.5  mg/dL  SLN        Assessment/Plan  Renal failure, unspecified -will need to follow up bmp once returns to [place] Anemia stable will need  follow up CBC once at [place] BPH (benign prostatic hyperplasia) stable on current medications Weakness Weakness and debility- improved but still will need to therapies once he returns to [place] S/p right femoral neck fracture-pain controlled with current medications. pt is stable for discharge-will need PT/OT/Nursing per home health. No DME needed.  will need to follow up with PCP with labs this week.

## 2013-12-08 DEATH — deceased

## 2014-03-17 ENCOUNTER — Emergency Department (HOSPITAL_COMMUNITY): Payer: Medicare Other

## 2014-03-17 ENCOUNTER — Observation Stay (HOSPITAL_COMMUNITY)
Admission: EM | Admit: 2014-03-17 | Discharge: 2014-03-19 | Disposition: A | Discharge status: Skilled Nursing Facility | Payer: Medicare Other | Attending: Internal Medicine | Admitting: Internal Medicine

## 2014-03-17 ENCOUNTER — Encounter (HOSPITAL_COMMUNITY): Payer: Self-pay | Admitting: *Deleted

## 2014-03-17 ENCOUNTER — Observation Stay (HOSPITAL_COMMUNITY): Payer: Medicare Other

## 2014-03-17 DIAGNOSIS — Y9289 Other specified places as the place of occurrence of the external cause: Secondary | ICD-10-CM | POA: Insufficient documentation

## 2014-03-17 DIAGNOSIS — S72114A Nondisplaced fracture of greater trochanter of right femur, initial encounter for closed fracture: Secondary | ICD-10-CM | POA: Diagnosis not present

## 2014-03-17 DIAGNOSIS — S32591A Other specified fracture of right pubis, initial encounter for closed fracture: Secondary | ICD-10-CM | POA: Diagnosis not present

## 2014-03-17 DIAGNOSIS — S32501A Unspecified fracture of right pubis, initial encounter for closed fracture: Secondary | ICD-10-CM

## 2014-03-17 DIAGNOSIS — I129 Hypertensive chronic kidney disease with stage 1 through stage 4 chronic kidney disease, or unspecified chronic kidney disease: Secondary | ICD-10-CM | POA: Insufficient documentation

## 2014-03-17 DIAGNOSIS — N400 Enlarged prostate without lower urinary tract symptoms: Secondary | ICD-10-CM | POA: Insufficient documentation

## 2014-03-17 DIAGNOSIS — Z96641 Presence of right artificial hip joint: Secondary | ICD-10-CM | POA: Insufficient documentation

## 2014-03-17 DIAGNOSIS — S32599A Other specified fracture of unspecified pubis, initial encounter for closed fracture: Secondary | ICD-10-CM | POA: Diagnosis present

## 2014-03-17 DIAGNOSIS — Z993 Dependence on wheelchair: Secondary | ICD-10-CM | POA: Diagnosis not present

## 2014-03-17 DIAGNOSIS — Z8673 Personal history of transient ischemic attack (TIA), and cerebral infarction without residual deficits: Secondary | ICD-10-CM | POA: Diagnosis not present

## 2014-03-17 DIAGNOSIS — D649 Anemia, unspecified: Secondary | ICD-10-CM | POA: Diagnosis present

## 2014-03-17 DIAGNOSIS — N189 Chronic kidney disease, unspecified: Secondary | ICD-10-CM

## 2014-03-17 DIAGNOSIS — S32511A Fracture of superior rim of right pubis, initial encounter for closed fracture: Principal | ICD-10-CM | POA: Insufficient documentation

## 2014-03-17 DIAGNOSIS — Z66 Do not resuscitate: Secondary | ICD-10-CM | POA: Insufficient documentation

## 2014-03-17 DIAGNOSIS — N183 Chronic kidney disease, stage 3 (moderate): Secondary | ICD-10-CM | POA: Insufficient documentation

## 2014-03-17 DIAGNOSIS — S72001A Fracture of unspecified part of neck of right femur, initial encounter for closed fracture: Secondary | ICD-10-CM | POA: Diagnosis present

## 2014-03-17 DIAGNOSIS — W1830XA Fall on same level, unspecified, initial encounter: Secondary | ICD-10-CM | POA: Insufficient documentation

## 2014-03-17 DIAGNOSIS — I2510 Atherosclerotic heart disease of native coronary artery without angina pectoris: Secondary | ICD-10-CM | POA: Insufficient documentation

## 2014-03-17 DIAGNOSIS — S329XXA Fracture of unspecified parts of lumbosacral spine and pelvis, initial encounter for closed fracture: Secondary | ICD-10-CM | POA: Diagnosis present

## 2014-03-17 DIAGNOSIS — F0390 Unspecified dementia without behavioral disturbance: Secondary | ICD-10-CM | POA: Insufficient documentation

## 2014-03-17 DIAGNOSIS — W19XXXA Unspecified fall, initial encounter: Secondary | ICD-10-CM

## 2014-03-17 LAB — BASIC METABOLIC PANEL
Anion gap: 14 (ref 5–15)
BUN: 19 mg/dL (ref 6–23)
CALCIUM: 8.7 mg/dL (ref 8.4–10.5)
CHLORIDE: 95 meq/L — AB (ref 96–112)
CO2: 23 meq/L (ref 19–32)
CREATININE: 1.38 mg/dL — AB (ref 0.50–1.35)
GFR calc Af Amer: 48 mL/min — ABNORMAL LOW (ref >90)
GFR calc non Af Amer: 42 mL/min — ABNORMAL LOW (ref >90)
Glucose, Bld: 116 mg/dL — ABNORMAL HIGH (ref 70–99)
Potassium: 3.7 mEq/L (ref 3.7–5.3)
Sodium: 132 mEq/L — ABNORMAL LOW (ref 137–147)

## 2014-03-17 LAB — CBC WITH DIFFERENTIAL/PLATELET
Basophils Absolute: 0 10*3/uL (ref 0.0–0.1)
Basophils Relative: 0 % (ref 0–1)
EOS PCT: 1 % (ref 0–5)
Eosinophils Absolute: 0 10*3/uL (ref 0.0–0.7)
HEMATOCRIT: 26.2 % — AB (ref 39.0–52.0)
HEMOGLOBIN: 9 g/dL — AB (ref 13.0–17.0)
LYMPHS PCT: 29 % (ref 12–46)
Lymphs Abs: 1.4 10*3/uL (ref 0.7–4.0)
MCH: 32.5 pg (ref 26.0–34.0)
MCHC: 34.4 g/dL (ref 30.0–36.0)
MCV: 94.6 fL (ref 78.0–100.0)
MONO ABS: 0.6 10*3/uL (ref 0.1–1.0)
MONOS PCT: 13 % — AB (ref 3–12)
NEUTROS ABS: 2.8 10*3/uL (ref 1.7–7.7)
Neutrophils Relative %: 57 % (ref 43–77)
Platelets: 184 10*3/uL (ref 150–400)
RBC: 2.77 MIL/uL — ABNORMAL LOW (ref 4.22–5.81)
RDW: 15.2 % (ref 11.5–15.5)
WBC: 4.8 10*3/uL (ref 4.0–10.5)

## 2014-03-17 MED ORDER — GUAIFENESIN 100 MG/5ML PO SYRP
200.0000 mg | ORAL_SOLUTION | Freq: Four times a day (QID) | ORAL | Status: DC | PRN
  Filled 2014-03-17: qty 10

## 2014-03-17 MED ORDER — ONDANSETRON HCL 4 MG PO TABS: 4.0000 mg | ORAL_TABLET | Freq: Four times a day (QID) | ORAL | Status: DC | PRN

## 2014-03-17 MED ORDER — SODIUM CHLORIDE 0.9 % IV SOLN
INTRAVENOUS | Status: DC
  Administered 2014-03-17 (×2): via INTRAVENOUS

## 2014-03-17 MED ORDER — ACETAMINOPHEN 650 MG RE SUPP: 650.0000 mg | Freq: Four times a day (QID) | RECTAL | Status: DC | PRN

## 2014-03-17 MED ORDER — SERTRALINE HCL 50 MG PO TABS
50.0000 mg | ORAL_TABLET | Freq: Every day | ORAL | Status: DC
  Administered 2014-03-18 – 2014-03-19 (×2): 50 mg via ORAL
  Filled 2014-03-17 (×2): qty 1

## 2014-03-17 MED ORDER — ALPRAZOLAM 0.25 MG PO TABS
0.2500 mg | ORAL_TABLET | Freq: Two times a day (BID) | ORAL | Status: DC
  Administered 2014-03-17 – 2014-03-19 (×4): 0.25 mg via ORAL
  Filled 2014-03-17 (×4): qty 1

## 2014-03-17 MED ORDER — TRAMADOL HCL 50 MG PO TABS
50.0000 mg | ORAL_TABLET | Freq: Once | ORAL | Status: AC
  Administered 2014-03-17: 50 mg via ORAL
  Filled 2014-03-17: qty 1

## 2014-03-17 MED ORDER — TERAZOSIN HCL 5 MG PO CAPS
5.0000 mg | ORAL_CAPSULE | Freq: Every day | ORAL | Status: DC
  Administered 2014-03-17 – 2014-03-18 (×2): 5 mg via ORAL
  Filled 2014-03-17 (×3): qty 1

## 2014-03-17 MED ORDER — FINASTERIDE 5 MG PO TABS
5.0000 mg | ORAL_TABLET | Freq: Every day | ORAL | Status: DC
  Administered 2014-03-17 – 2014-03-18 (×2): 5 mg via ORAL
  Filled 2014-03-17 (×3): qty 1

## 2014-03-17 MED ORDER — ONDANSETRON HCL 4 MG/2ML IJ SOLN: 4.0000 mg | Freq: Four times a day (QID) | INTRAMUSCULAR | Status: DC | PRN

## 2014-03-17 MED ORDER — TAB-A-VITE/IRON PO TABS
1.0000 | ORAL_TABLET | Freq: Every day | ORAL | Status: DC
  Administered 2014-03-18 – 2014-03-19 (×2): 1 via ORAL
  Filled 2014-03-17 (×2): qty 1

## 2014-03-17 MED ORDER — FUROSEMIDE 20 MG PO TABS
10.0000 mg | ORAL_TABLET | Freq: Every day | ORAL | Status: DC
Start: 2014-03-18 — End: 2014-03-19
  Administered 2014-03-18 – 2014-03-19 (×2): 10 mg via ORAL
  Filled 2014-03-17 (×2): qty 0.5

## 2014-03-17 MED ORDER — ENSURE COMPLETE PO LIQD: 237.0000 mL | Freq: Two times a day (BID) | ORAL | Status: DC

## 2014-03-17 MED ORDER — HYDROCODONE-ACETAMINOPHEN 5-325 MG PO TABS: 2.0000 | ORAL_TABLET | ORAL | Status: DC | PRN

## 2014-03-17 MED ORDER — ACETAMINOPHEN 325 MG PO TABS
650.0000 mg | ORAL_TABLET | Freq: Once | ORAL | Status: AC
  Administered 2014-03-17: 650 mg via ORAL
  Filled 2014-03-17: qty 2

## 2014-03-17 MED ORDER — DIVALPROEX SODIUM 125 MG PO CPSP
250.0000 mg | ORAL_CAPSULE | Freq: Every evening | ORAL | Status: DC
  Administered 2014-03-17 – 2014-03-18 (×2): 250 mg via ORAL
  Filled 2014-03-17 (×3): qty 2

## 2014-03-17 MED ORDER — QUETIAPINE 12.5 MG HALF TABLET
12.5000 mg | ORAL_TABLET | Freq: Every day | ORAL | Status: DC
  Administered 2014-03-18 – 2014-03-19 (×2): 12.5 mg via ORAL
  Filled 2014-03-17 (×2): qty 1

## 2014-03-17 MED ORDER — TRAMADOL HCL 50 MG PO TABS
75.0000 mg | ORAL_TABLET | Freq: Three times a day (TID) | ORAL | Status: DC
  Administered 2014-03-17 – 2014-03-19 (×5): 75 mg via ORAL
  Filled 2014-03-17 (×5): qty 2

## 2014-03-17 MED ORDER — HYDROCORTISONE 2.5 % RE CREA
1.0000 "application " | TOPICAL_CREAM | Freq: Two times a day (BID) | RECTAL | Status: DC | PRN
  Filled 2014-03-17: qty 28.35

## 2014-03-17 MED ORDER — PANTOPRAZOLE SODIUM 40 MG PO TBEC
40.0000 mg | DELAYED_RELEASE_TABLET | Freq: Every day | ORAL | Status: DC
  Administered 2014-03-18 – 2014-03-19 (×2): 40 mg via ORAL
  Filled 2014-03-17 (×3): qty 1

## 2014-03-17 MED ORDER — ACETAMINOPHEN 325 MG PO TABS: 650.0000 mg | ORAL_TABLET | Freq: Four times a day (QID) | ORAL | Status: DC | PRN

## 2014-03-17 MED ORDER — SENNOSIDES-DOCUSATE SODIUM 8.6-50 MG PO TABS
1.0000 | ORAL_TABLET | Freq: Two times a day (BID) | ORAL | Status: DC
  Administered 2014-03-17 – 2014-03-18 (×2): 1 via ORAL
  Filled 2014-03-17 (×6): qty 1

## 2014-03-17 NOTE — ED Notes (Addendum)
Per EMS, [facility] Pt fell back on to his buttocks last night and refused to be evaluated at the time. Pt began complaining of right hip pain this morning and is not able to bear weight on his right leg; he is usually able to walk. Shortening and deformity noted on right leg. Pain is 8/10.

## 2014-03-17 NOTE — H&P (Signed)
[facility] History and Physical  Russell [name]:[id number] DOB: 04-15-1916 DOA: 03/17/2014  Referring physician: ED PCP: Pcp Not In System   Chief Complaint:  Fall at assist living  HPI:  80 year old male with history of stroke, hypertension , anemia, chronic kidney disease , BPH, right hip hemiarthroplasty for fracture in 2014 who is wheelchair-bound with occasional transfer to chair or bed was brought in from assisted living after he had a mechanical fall while he was cleaning his bathroom. Patient reports landing on his right hip and denies sustaining any other injury including head injury. He denies loss of consciousness. He reports pain in his right hip with inability to flex his hip or the knee. Patient denies headache, dizziness, fever, chills, nausea , vomiting, chest pain, palpitations, SOB, abdominal pain, bowel or urinary symptoms.   Course in the ED Vitals were stable. Blood will be taken for hemoglobin of 9, sodium of 132, chloride 95, creatinine 1.38 and blood glucose of 116. Head CT done was negative for acute injury. X-ray of the right femur showed suspicion for nondisplaced fracture of the right superior pubic ramus. X-ray of the hip showed suspected nondisplaced right superior pubic grams fracture. Since patient was in pain and unable to transfer ED recommended admission for pain control and skilled nursing facility placement. ED consulted the PA at [facility] who recommended outpatient follow-up in 2 weeks.  Review of Systems:  Constitutional: Denies fever, chills, diaphoresis, appetite change and fatigue.  HEENT: Denies visual or hearing symptoms, congestion, difficulty swallowing or neck pain  Respiratory: Denies SOB, DOE, cough, chest tightness,  and wheezing.   Cardiovascular: Denies chest pain, palpitations and leg swelling.  Gastrointestinal: Denies nausea, vomiting, abdominal pain, diarrhea, constipation, blood in stool and abdominal  distention.  Genitourinary: Denies dysuria,  hematuria, flank pain and difficulty urinating.  Musculoskeletal: Pain in right hip and femur , Denies myalgias, back pain, joint pain  Skin: Denies rash and wound.  Neurological: Denies dizziness,syncope, weakness, light-headedness, numbness and headaches.  Psychiatric/Behavioral: Denies confusion Past Medical History  Diagnosis Date  . Stroke   . Acute bronchitis   . Hypertension    Past Surgical History  Procedure Laterality Date  . Hernia repair    . Lumbar disc surgery    . Hip arthroplasty Right 08/26/2012    Procedure: RIGHT HEMI HIP ARTHROPLASTY ;  Surgeon: Velna Ochs, MD;  Location: [facility];  Service: Orthopedics;  Laterality: Right;   Social History:  reports that he has never smoked. He has never used smokeless tobacco. He reports that he does not drink alcohol or use illicit drugs.  Allergies  Allergen Reactions  . Ambien [Zolpidem Tartrate] Other (See Comments)    Unknown reaction  . Penicillins Other (See Comments)    Unknown reaction    Family History  Problem Relation Age of Onset  . CVA Mother     Prior to Admission medications   Medication Sig Start Date End Date Taking? Authorizing Provider  acetaminophen (TYLENOL) 500 MG tablet Take 500 mg by mouth 3 (three) times daily.   Yes Historical Provider, MD  ALPRAZolam Prudy Feeler) 0.25 MG tablet Take one tablet by mouth every 6 hours as needed for anxiety Patient taking differently: Take 0.25 mg by mouth 2 (two) times daily. Take one tablet by mouth every 6 hours as needed for anxiety 10/02/12  Yes Sharon Seller, NP  diclofenac sodium (VOLTAREN) 1 % GEL Apply topically 3 (three) times daily. Apply to thumbs  Yes Historical Provider, MD  divalproex (DEPAKOTE SPRINKLE) 125 MG capsule Take 250 mg by mouth every evening.   Yes Historical Provider, MD  Ensure Plus (ENSURE PLUS) LIQD Take 237 mLs by mouth 2 (two) times daily.    Yes Historical Provider, MD  furosemide  (LASIX) 20 MG tablet Take 10 mg by mouth daily.   Yes Historical Provider, MD  guaifenesin (ROBITUSSIN) 100 MG/5ML syrup Take 200 mg by mouth every 6 (six) hours as needed for cough.   Yes Historical Provider, MD  hydrocortisone (ANUSOL-HC) 2.5 % rectal cream Place 1 application rectally 2 (two) times daily as needed for hemorrhoids or itching.   Yes Historical Provider, MD  Multiple Vitamins-Iron (MULTIVITAMINS WITH IRON) TABS tablet Take 1 tablet by mouth daily.   Yes Historical Provider, MD  omeprazole (PRILOSEC) 20 MG capsule Take 20 mg by mouth daily.   Yes Historical Provider, MD  polyethylene glycol (MIRALAX / GLYCOLAX) packet Take 17 g by mouth daily. 02/11/12  Yes Joseph ArtJessica U Vann, DO  QUEtiapine (SEROQUEL) 25 MG tablet Take 12.5 mg by mouth daily.   Yes Historical Provider, MD  senna-docusate (SENOKOT-S) 8.6-50 MG per tablet Take 1 tablet by mouth 2 (two) times daily.   Yes Historical Provider, MD  sertraline (ZOLOFT) 50 MG tablet Take 50 mg by mouth daily.   Yes Historical Provider, MD  terazosin (HYTRIN) 5 MG capsule Take 5 mg by mouth at bedtime.   Yes Historical Provider, MD  traMADol (ULTRAM) 50 MG tablet Take 75 mg by mouth 3 (three) times daily.   Yes Historical Provider, MD  finasteride (PROSCAR) 5 MG tablet Take 5 mg by mouth at bedtime.    Historical Provider, MD  HYDROcodone-acetaminophen (NORCO) 5-325 MG per tablet Take 1 tablet by mouth every 6 (six) hours as needed for pain. Patient not taking: Reported on 03/17/2014 08/29/12   Meredeth IdeGagan S Lama, MD     Physical Exam:  Filed Vitals:   03/17/14 1236 03/17/14 1450  BP: 129/81 159/77  Pulse: 63 63  Temp: 97.9 F (36.6 C)   Resp: 18 20  SpO2: 97% 98%    Constitutional: Vital signs reviewed.  Patient is a well-developed and well-nourished in no acute distress. HEENT: no pallor, no icterus, moist oral mucosa, no cervical lymphadenopathy Cardiovascular: RRR, S1 normal, S2 normal, no MRG Chest: CTAB, no wheezes, rales, or  rhonchi Abdominal: Soft. Non-tender, non-distended, bowel sounds are normal, no masses, organomegaly, or guarding present.  GU: no CVA tenderness Ext: warm, no edema Neurological: A&O x3, non focal  Labs on Admission:  Basic Metabolic Panel:  Recent Labs Lab 03/17/14 1423  NA 132*  K 3.7  CL 95*  CO2 23  GLUCOSE 116*  BUN 19  CREATININE 1.38*  CALCIUM 8.7   Liver Function Tests: No results for input(s): AST, ALT, ALKPHOS, BILITOT, PROT, ALBUMIN in the last 168 hours. No results for input(s): LIPASE, AMYLASE in the last 168 hours. No results for input(s): AMMONIA in the last 168 hours. CBC:  Recent Labs Lab 03/17/14 1423  WBC 4.8  NEUTROABS 2.8  HGB 9.0*  HCT 26.2*  MCV 94.6  PLT 184   Cardiac Enzymes: No results for input(s): CKTOTAL, CKMB, CKMBINDEX, TROPONINI in the last 168 hours. BNP: Invalid input(s): POCBNP CBG: No results for input(s): GLUCAP in the last 168 hours.  Radiological Exams on Admission: Dg Hip Complete Right  03/17/2014   CLINICAL DATA:  Fall on buttocks last night. Right hip pain. Unable to bear weight  on right leg.  EXAM: RIGHT HIP - COMPLETE 2+ VIEW  COMPARISON:  08/25/2012  FINDINGS: Patient is status post right hip replacement. Old healed right femoral midshaft fracture. Suspect a nondisplaced superior pubic ramus fracture on the right. No additional acute bony abnormality. Mild diffuse osteopenia.  IMPRESSION: Suspected nondisplaced right superior pubic ramus fracture.  Prior right hip replacement.  No hardware complicating feature.   Electronically Signed   By: Charlett Nose M.D.   On: 03/17/2014 13:46   Dg Femur Right  03/17/2014   CLINICAL DATA:  80 year old male with history of trauma after a fall onto the buttocks region last night complaining of right-sided hip pain. Unable to bear weight on the right leg.  EXAM: RIGHT FEMUR - 2 VIEW  COMPARISON:  08/25/2012.  FINDINGS: There continues to be a subtle lucency in the right superior pubic  ramus, suspicious for a nondisplaced fracture. Status post right hip bipolar hemiarthroplasty. The prosthetic femoral head is properly located in the acetabulum. No periprosthetic fracture is identified. There is a healed fracture of the mid femoral diaphysis with mild posttraumatic deformity.  IMPRESSION: 1. No acute radiographic abnormality of the right femur. 2. Findings remain suspicious for a subtle nondisplaced fracture of the right superior pubic ramus. This could be confirmed with noncontrast CT if clinically appropriate. 3. Additional findings, as above.   Electronically Signed   By: Trudie Reed M.D.   On: 03/17/2014 15:54   Ct Head Wo Contrast  03/17/2014   CLINICAL DATA:  80 year old who fell backward yesterday while at the nursing home, unwitnessed fall.  EXAM: CT HEAD WITHOUT CONTRAST  TECHNIQUE: Contiguous axial images were obtained from the base of the skull through the vertex without intravenous contrast.  COMPARISON:  09/11/2012, 03/18/2012.  FINDINGS: Severe cortical, deep and cerebellar atrophy, not significantly changed. Moderate to severe changes of small vessel disease of the white matter diffusely, unchanged. No mass lesion. No midline shift. No acute hemorrhage or hematoma. No extra-axial fluid collections. No evidence of acute infarction. No significant interval change.  No skull fracture or other focal osseous abnormality involving the skull. Visualized paranasal sinuses, bilateral mastoid air cells and bilateral middle ear cavities well-aerated. Severe vertebrobasilar and bilateral carotid siphon atherosclerosis.  IMPRESSION: 1. No acute intracranial abnormality. 2. Stable severe generalized atrophy and moderate to severe chronic microvascular ischemic changes of the white matter.   Electronically Signed   By: Hulan Saas M.D.   On: 03/17/2014 16:34    EKG: Pending  Assessment/Plan Principal Problem:   Superior Pubic ramus fracture Nondisplaced superior pubic grams  fracture. Needs pain control and PT. Patient is in an assist living at [facility] and would benefit from going to the skilled nursing over there. Social work in the ED has already consulted [facility] and verified that they have skilled nursing that and can be accepted that. Will monitor overnight for pain control and PT eval in the morning. -follow with [facility] in 2 weeks as outpt -Pain control with tramadol and Vicodin when necessary.  Suspected right femur fracture Patient has shortened right leg with medial rotation and painful knee and hip flexion. Will obtain CT of the hip to rule out femoral neck fracture. If CT suggests femoral fracture will consult [facility] for evaluation. Patient is wheelchair bound with ability to transfer. At baseline he is quite functional for his age. Discussed with his son Ethelene Browns on the phone and agrees with surgery a few indicated. He has moderate risk for  surgery and given his history of stroke would benefit from perioperative beta blocker if planned or surgery.   Active Problems:   Chronic kidney disease Baseline around 1.4 from previous year. Seems stable. Monitor with gentle IV hydration    Anemia Chronic and unchanged from prior. Monitor    Fracture of right hip   BPH (benign prostatic hyperplasia) Continue finasteride     H/O: stroke  Not on any medications.  Remaining medical issues appear stable. Continue home medications    Diet: Heart healthy.  DVT prophylaxis: SCDs    Diet:cardiac  DVT prophylaxis: sq lovenox   Code Status: DO NOT RESUSCITATE. Confirmed with patient's son   Family Communication: discussed with patient's son Ethelene Brownsnthony on the phone  Disposition Plan: Skilled nursing facility depending upon CT results  Eddie NorthDHUNGEL, Jaxan Michel [facility] Pager [phone]  Total time spent on admission :50 minutes  If 7PM-7AM, please contact night-coverage [email or web address] Password  [email or web address] 03/17/2014, 5:14 PM

## 2014-03-17 NOTE — ED Notes (Signed)
Bed: WA06 Expected date:  Expected time:  Means of arrival:  Comments: Ems- 80 yo M, hip pain

## 2014-03-17 NOTE — ED Provider Notes (Signed)
CSN: [id number]     Arrival date & time 03/17/14  1233 History   First MD Initiated Contact with Patient 03/17/14 1245     Chief Complaint  Patient presents with  . Hip Pain     (Consider location/radiation/quality/duration/timing/severity/associated sxs/prior Treatment) HPI Comments: Patient is a 80 year old male with a past medical history of previous stroke and hypertension who presents to the ED via EMS from [facility] living for right hip pain. The pain started yesterday after the patient suffered a mechanical fall in his room while trying to clean. Patient lost his footing and fell, landing on his right hip. Patient was having throbbing pain that did not radiate last night but this pain worsened this morning. Patient does not walk at baseline but is able to bear weight on his feet when transferring from his wheelchair. Today, he was unable to bear weight on the right leg due to pain. Patient is unsure of head trauma or LOC. No other injury. Movement and palpation of the hip makes the pain worse. No alleviating factors. Patient denies any other symptoms.    Past Medical History  Diagnosis Date  . Stroke   . Acute bronchitis   . Hypertension    Past Surgical History  Procedure Laterality Date  . Hernia repair    . Lumbar disc surgery    . Hip arthroplasty Right 08/26/2012    Procedure: RIGHT HEMI HIP ARTHROPLASTY ;  Surgeon: Velna OchsPeter G Dalldorf, MD;  Location: [facility];  Service: Orthopedics;  Laterality: Right;   Family History  Problem Relation Age of Onset  . CVA Mother    History  Substance Use Topics  . Smoking status: Never Smoker   . Smokeless tobacco: Never Used  . Alcohol Use: No    Review of Systems  Constitutional: Negative for fever, chills and fatigue.  HENT: Negative for trouble swallowing.   Eyes: Negative for visual disturbance.  Respiratory: Negative for shortness of breath.   Cardiovascular: Negative for chest pain and palpitations.   Gastrointestinal: Negative for nausea, vomiting, abdominal pain and diarrhea.  Genitourinary: Negative for dysuria and difficulty urinating.  Musculoskeletal: Positive for arthralgias. Negative for neck pain.  Skin: Negative for color change.  Neurological: Negative for dizziness and weakness.  Psychiatric/Behavioral: Negative for dysphoric mood.      Allergies  Ambien and Penicillins  Home Medications   Prior to Admission medications   Medication Sig Start Date End Date Taking? Authorizing Provider  acetaminophen (TYLENOL) 500 MG tablet Take 500 mg by mouth 3 (three) times daily.   Yes Historical Provider, MD  ALPRAZolam Prudy Feeler(XANAX) 0.25 MG tablet Take one tablet by mouth every 6 hours as needed for anxiety Patient taking differently: Take 0.25 mg by mouth 2 (two) times daily. Take one tablet by mouth every 6 hours as needed for anxiety 10/02/12  Yes Sharon SellerJessica K Eubanks, NP  diclofenac sodium (VOLTAREN) 1 % GEL Apply topically 3 (three) times daily. Apply to thumbs   Yes Historical Provider, MD  divalproex (DEPAKOTE SPRINKLE) 125 MG capsule Take 250 mg by mouth every evening.   Yes Historical Provider, MD  Ensure Plus (ENSURE PLUS) LIQD Take 237 mLs by mouth 2 (two) times daily.    Yes Historical Provider, MD  furosemide (LASIX) 20 MG tablet Take 10 mg by mouth daily.   Yes Historical Provider, MD  guaifenesin (ROBITUSSIN) 100 MG/5ML syrup Take 200 mg by mouth every 6 (six) hours as needed for cough.   Yes Historical  Provider, MD  hydrocortisone (ANUSOL-HC) 2.5 % rectal cream Place 1 application rectally 2 (two) times daily as needed for hemorrhoids or itching.   Yes Historical Provider, MD  Multiple Vitamins-Iron (MULTIVITAMINS WITH IRON) TABS tablet Take 1 tablet by mouth daily.   Yes Historical Provider, MD  omeprazole (PRILOSEC) 20 MG capsule Take 20 mg by mouth daily.   Yes Historical Provider, MD  polyethylene glycol (MIRALAX / GLYCOLAX) packet Take 17 g by mouth daily. 02/11/12  Yes  Joseph ArtJessica U Vann, DO  QUEtiapine (SEROQUEL) 25 MG tablet Take 12.5 mg by mouth daily.   Yes Historical Provider, MD  senna-docusate (SENOKOT-S) 8.6-50 MG per tablet Take 1 tablet by mouth 2 (two) times daily.   Yes Historical Provider, MD  sertraline (ZOLOFT) 50 MG tablet Take 50 mg by mouth daily.   Yes Historical Provider, MD  terazosin (HYTRIN) 5 MG capsule Take 5 mg by mouth at bedtime.   Yes Historical Provider, MD  traMADol (ULTRAM) 50 MG tablet Take 75 mg by mouth 3 (three) times daily.   Yes Historical Provider, MD  finasteride (PROSCAR) 5 MG tablet Take 5 mg by mouth at bedtime.    Historical Provider, MD  HYDROcodone-acetaminophen (NORCO) 5-325 MG per tablet Take 1 tablet by mouth every 6 (six) hours as needed for pain. Patient not taking: Reported on 03/17/2014 08/29/12   Meredeth IdeGagan S Lama, MD   BP 159/77 mmHg  Pulse 63  Temp(Src) 97.9 F (36.6 C)  Resp 20  SpO2 98% Physical Exam  Constitutional: He is oriented to person, place, and time. He appears well-developed and well-nourished. No distress.  HENT:  Head: Normocephalic and atraumatic.  Eyes: Conjunctivae are normal.  Neck: Normal range of motion.  Cardiovascular: Normal rate, regular rhythm and intact distal pulses.  Exam reveals no gallop and no friction rub.   No murmur heard. Bilateral pedal pulse intact  Pulmonary/Chest: Effort normal and breath sounds normal. He has no wheezes. He has no rales. He exhibits no tenderness.  Abdominal: Soft. He exhibits no distension. There is no tenderness. There is no rebound.  Musculoskeletal: Normal range of motion.  No obvious deformity of right hip. Lateral right hip tenderness to palpation. Limited passive flexion of right hip due to pain of anterior hip. Sufficient abduction and adduction of right leg. No leg length discrepancy or internal/external rotation of the right leg.   Neurological: He is alert and oriented to person, place, and time. Coordination normal.  Lower extremity  sensation equal and intact bilaterally. Speech is goal-oriented. Moves limbs without ataxia.   Skin: Skin is warm and dry.  No wound.   Psychiatric: He has a normal mood and affect. His behavior is normal.  Nursing note and vitals reviewed.   ED Course  Procedures (including critical care time) Labs Review Labs Reviewed  CBC WITH DIFFERENTIAL - Abnormal; Notable for the following:    RBC 2.77 (*)    Hemoglobin 9.0 (*)    HCT 26.2 (*)    Monocytes Relative 13 (*)    All other components within normal limits  BASIC METABOLIC PANEL - Abnormal; Notable for the following:    Sodium 132 (*)    Chloride 95 (*)    Glucose, Bld 116 (*)    Creatinine, Ser 1.38 (*)    GFR calc non Af Amer 42 (*)    GFR calc Af Amer 48 (*)    All other components within normal limits    Imaging Review Dg Hip Complete Right  03/17/2014   CLINICAL DATA:  Fall on buttocks last night. Right hip pain. Unable to bear weight on right leg.  EXAM: RIGHT HIP - COMPLETE 2+ VIEW  COMPARISON:  08/25/2012  FINDINGS: Patient is status post right hip replacement. Old healed right femoral midshaft fracture. Suspect a nondisplaced superior pubic ramus fracture on the right. No additional acute bony abnormality. Mild diffuse osteopenia.  IMPRESSION: Suspected nondisplaced right superior pubic ramus fracture.  Prior right hip replacement.  No hardware complicating feature.   Electronically Signed   By: Charlett Nose M.D.   On: 03/17/2014 13:46   Dg Femur Right  03/17/2014   CLINICAL DATA:  80 year old male with history of trauma after a fall onto the buttocks region last night complaining of right-sided hip pain. Unable to bear weight on the right leg.  EXAM: RIGHT FEMUR - 2 VIEW  COMPARISON:  08/25/2012.  FINDINGS: There continues to be a subtle lucency in the right superior pubic ramus, suspicious for a nondisplaced fracture. Status post right hip bipolar hemiarthroplasty. The prosthetic femoral head is properly located in the  acetabulum. No periprosthetic fracture is identified. There is a healed fracture of the mid femoral diaphysis with mild posttraumatic deformity.  IMPRESSION: 1. No acute radiographic abnormality of the right femur. 2. Findings remain suspicious for a subtle nondisplaced fracture of the right superior pubic ramus. This could be confirmed with noncontrast CT if clinically appropriate. 3. Additional findings, as above.   Electronically Signed   By: Trudie Reed M.D.   On: 03/17/2014 15:54     EKG Interpretation None      MDM   Final diagnoses:  Fall  Pubic ramus fracture, right, closed, initial encounter    4:25 PM Patient's xray shows right superior pubic ramus fracture. Patient is unable to transfer. Will admit for pain control/rehab placement.   I spoke with a PA at [facility] who recommends outpatient follow up in 2 weeks.     Emilia Beck, PA-C 03/18/14 2155  Tilden Fossa, MD 03/20/14 [phone]

## 2014-03-17 NOTE — ED Notes (Signed)
Patient transported to CT 

## 2014-03-17 NOTE — Progress Notes (Signed)
CSW reached out to [facility] [place], which is where the patient resides. Representative says that the facility does offer [facility] and physical therapy. Facility informed CSW that the patient is welcomed to come back and would not need any additional resources in order to start their physical therapy program.   Trish MageBrittney Ruston Fedora, LCSWA [phone] ED CSW 03/17/2014 5:01 PM

## 2014-03-17 NOTE — ED Notes (Signed)
Called to give report to 6th floor. Nurses getting report on other patients. Will call back.

## 2014-03-17 NOTE — Progress Notes (Signed)
CSW spoke with patient at bedside. Family was not present. Patient confirms that he stays at [facility]. Patient says he was in his wheel chair and it turned over. Patient informed CSW that his hip is hurting. Patient says he does not feel as though he has a good support system. Patient stated that his neck was hurting. CSW consulted with nurse to lift bed.  Trish MageBrittney Camila Maita, LCSWA [phone] ED CSW 03/17/2014 4:46 PM

## 2014-03-18 DIAGNOSIS — Z8673 Personal history of transient ischemic attack (TIA), and cerebral infarction without residual deficits: Secondary | ICD-10-CM

## 2014-03-18 LAB — SURGICAL PCR SCREEN
MRSA, PCR: POSITIVE — AB
Staphylococcus aureus: POSITIVE — AB

## 2014-03-18 LAB — CBC
HCT: 25 % — ABNORMAL LOW (ref 39.0–52.0)
HEMOGLOBIN: 8.4 g/dL — AB (ref 13.0–17.0)
MCH: 32.4 pg (ref 26.0–34.0)
MCHC: 33.6 g/dL (ref 30.0–36.0)
MCV: 96.5 fL (ref 78.0–100.0)
Platelets: 156 10*3/uL (ref 150–400)
RBC: 2.59 MIL/uL — AB (ref 4.22–5.81)
RDW: 15.5 % (ref 11.5–15.5)
WBC: 4.7 10*3/uL (ref 4.0–10.5)

## 2014-03-18 MED ORDER — SODIUM CHLORIDE 0.9 % IV SOLN
INTRAVENOUS | Status: AC
  Administered 2014-03-18: 75 mL/h via INTRAVENOUS
  Administered 2014-03-18: 23:00:00 via INTRAVENOUS

## 2014-03-18 NOTE — Progress Notes (Signed)
PT Cancellation Note  Patient Details Name: Russell [name] MRN: [id number] DOB: January 24, 1917   Cancelled Treatment:     PT eval attempted following ortho consult.  Pt adamantly refusing attempts to mobilize.  "I just want to lay in bed and let my leg get better"  "I am not doing something just to please you"  "If you won't let me lay in the bed, just send me back to [facility] and I can lay in the bed there."  Will follow.   Jakai Risse 03/18/2014, 5:34 PM

## 2014-03-18 NOTE — Plan of Care (Signed)
Problem: Phase I Progression Outcomes Goal: Pre op labs/procedures/consults per MD order Outcome: Completed/Met Date Met:  03/18/14

## 2014-03-18 NOTE — Progress Notes (Signed)
Patient ID: Russell [name]  male  ZOX:[id number]    DOB: 10-26-1916    DOA: 03/17/2014  PCP: Pcp Not In System  Brief narrative  Patient is a 80 year old male with stroke, hypertension, anemia, CAD, BPH, right hip hemiarthroplasty for fracture in 2014, wheelchair bound was brought in from ALF after a mechanical fall while he was cleaning his bathroom. Patient reported landing on his right hip. Patient reported pain in his right hip with inability to flex his hip or the knee. X-ray in ED showed suspected nondisplaced right superior pubic rami fracture, right femur x-ray showed no acute radiographic abnormality but the possibility of subtle nondisplaced fracture of right superior pubic rami   Assessment/Plan: Principal Problem:  Mechanical fall with Acute Fracture of right hip - CT of the right hip showed acute mildly displaced fracture of the right greater trochanter, no significant inferior extension of the fracture into the diatheses or hardware loosening. - Orthopedics consulted, [facility]  Active Problems: Superior pubic rami fracture - CT showed no definitive pubic rami fractures, PTOT evaluation after orthopedic recommendations    Chronic kidney disease- Stage III - Currently stable    Anemia - H&H currently stable, hemoglobin 8.4    BPH (benign prostatic hyperplasia) -Continue finasteride, hytrin  Dementia Continue Seroquel, Zoloft   DVT Prophylaxis: SCD;s  Code Status:-DO NOT RESUSCITATE  Family Communication:Discussed in detail with patient's son, Dr. Antonieta LovelessAnthony Dunton  Disposition:Will likely need skilled nursing facility  Consultants: Orthopedics  Procedures: CT of the right hip  Antibiotics:  None  Subjective: Patient seen and examined, pain control, denies any specific complaints, no acute issues overnight   Objective: Weight change:   Intake/Output Summary (Last 24 hours) at 03/18/14 1154 Last data filed at 03/18/14 1131  Gross per 24  hour  Intake 1657.5 ml  Output    425 ml  Net 1232.5 ml   Blood pressure 114/63, pulse 66, temperature 99.1 F (37.3 C), temperature source Oral, resp. rate 18, SpO2 98 %.  Physical Exam: General: Alert and awake, oriented x2, not in any acute distress. CVS: S1-S2 clear, no murmur rubs or gallops Chest: clear to auscultation bilaterally, no wheezing, rales or rhonchi Abdomen: soft nontender, nondistended, normal bowel sounds  Extremities: no cyanosis, clubbing or edema noted bilaterally   Lab Results: Basic Metabolic Panel:  Recent Labs Lab 03/17/14 1423  NA 132*  K 3.7  CL 95*  CO2 23  GLUCOSE 116*  BUN 19  CREATININE 1.38*  CALCIUM 8.7   Liver Function Tests: No results for input(s): AST, ALT, ALKPHOS, BILITOT, PROT, ALBUMIN in the last 168 hours. No results for input(s): LIPASE, AMYLASE in the last 168 hours. No results for input(s): AMMONIA in the last 168 hours. CBC:  Recent Labs Lab 03/17/14 1423 03/18/14 0513  WBC 4.8 4.7  NEUTROABS 2.8  --   HGB 9.0* 8.4*  HCT 26.2* 25.0*  MCV 94.6 96.5  PLT 184 156   Cardiac Enzymes: No results for input(s): CKTOTAL, CKMB, CKMBINDEX, TROPONINI in the last 168 hours. BNP: Invalid input(s): POCBNP CBG: No results for input(s): GLUCAP in the last 168 hours.   Micro Results: No results found for this or any previous visit (from the past 240 hour(s)).  Studies/Results: Dg Hip Complete Right  03/17/2014   CLINICAL DATA:  Fall on buttocks last night. Right hip pain. Unable to bear weight on right leg.  EXAM: RIGHT HIP - COMPLETE 2+ VIEW  COMPARISON:  08/25/2012  FINDINGS: Patient is status  post right hip replacement. Old healed right femoral midshaft fracture. Suspect a nondisplaced superior pubic ramus fracture on the right. No additional acute bony abnormality. Mild diffuse osteopenia.  IMPRESSION: Suspected nondisplaced right superior pubic ramus fracture.  Prior right hip replacement.  No hardware complicating  feature.   Electronically Signed   By: Charlett NoseKevin  Dover M.D.   On: 03/17/2014 13:46   Dg Femur Right  03/17/2014   CLINICAL DATA:  80 year old male with history of trauma after a fall onto the buttocks region last night complaining of right-sided hip pain. Unable to bear weight on the right leg.  EXAM: RIGHT FEMUR - 2 VIEW  COMPARISON:  08/25/2012.  FINDINGS: There continues to be a subtle lucency in the right superior pubic ramus, suspicious for a nondisplaced fracture. Status post right hip bipolar hemiarthroplasty. The prosthetic femoral head is properly located in the acetabulum. No periprosthetic fracture is identified. There is a healed fracture of the mid femoral diaphysis with mild posttraumatic deformity.  IMPRESSION: 1. No acute radiographic abnormality of the right femur. 2. Findings remain suspicious for a subtle nondisplaced fracture of the right superior pubic ramus. This could be confirmed with noncontrast CT if clinically appropriate. 3. Additional findings, as above.   Electronically Signed   By: Trudie Reedaniel  Entrikin M.D.   On: 03/17/2014 15:54   Ct Head Wo Contrast  03/17/2014   CLINICAL DATA:  80 year old who fell backward yesterday while at the nursing home, unwitnessed fall.  EXAM: CT HEAD WITHOUT CONTRAST  TECHNIQUE: Contiguous axial images were obtained from the base of the skull through the vertex without intravenous contrast.  COMPARISON:  09/11/2012, 03/18/2012.  FINDINGS: Severe cortical, deep and cerebellar atrophy, not significantly changed. Moderate to severe changes of small vessel disease of the white matter diffusely, unchanged. No mass lesion. No midline shift. No acute hemorrhage or hematoma. No extra-axial fluid collections. No evidence of acute infarction. No significant interval change.  No skull fracture or other focal osseous abnormality involving the skull. Visualized paranasal sinuses, bilateral mastoid air cells and bilateral middle ear cavities well-aerated. Severe  vertebrobasilar and bilateral carotid siphon atherosclerosis.  IMPRESSION: 1. No acute intracranial abnormality. 2. Stable severe generalized atrophy and moderate to severe chronic microvascular ischemic changes of the white matter.   Electronically Signed   By: Hulan Saashomas  Lawrence M.D.   On: 03/17/2014 16:34   Ct Hip Right Wo Contrast  03/17/2014   CLINICAL DATA:  Right hip pain status post fall. History of arthroplasty. Initial encounter.  EXAM: CT OF THE RIGHT HIP WITHOUT CONTRAST  TECHNIQUE: Multidetector CT imaging of the right hip was performed according to the standard protocol. Multiplanar CT image reconstructions were also generated.  COMPARISON:  Radiographs today.  Pelvic CT 02/06/2012.  FINDINGS: Examination is limited to the right hip and inferior right hemipelvis. The entire pelvis is not imaged.  Patient is status post right hip hemiarthroplasty. There is no dislocation. The hardware creates moderate beam hardening artifact, limiting osseous evaluation. However, no definite acute fracture of the pubic rami is demonstrated as questioned on the prior radiographs. There is however a mildly displaced acute-appearing fracture of the greater trochanter. This is best demonstrated on the reformatted images. The proximal diaphysis appears intact. There is no hardware loosening. Chronic posttraumatic deformity of the proximal femoral diaphysis distal to the prosthesis is not imaged.  There is no large proximal thigh or right pelvic hematoma. Vascular calcifications are noted.  IMPRESSION: 1. Acute mildly displaced fracture of the right greater trochanter.  No significant inferior extension of the fracture into the diaphysis or hardware loosening. 2. No dislocation. 3. No definite pubic rami fractures. This area is not optimally evaluated due to artifact from the arthroplasty.   Electronically Signed   By: Roxy Horseman M.D.   On: 03/17/2014 19:19    Medications: Scheduled Meds: . ALPRAZolam  0.25 mg Oral BID   . divalproex  250 mg Oral QPM  . feeding supplement (ENSURE COMPLETE)  237 mL Oral BID  . finasteride  5 mg Oral QHS  . furosemide  10 mg Oral Daily  . multivitamins with iron  1 tablet Oral Daily  . pantoprazole  40 mg Oral Daily  . QUEtiapine  12.5 mg Oral Daily  . senna-docusate  1 tablet Oral BID  . sertraline  50 mg Oral Daily  . terazosin  5 mg Oral QHS  . traMADol  75 mg Oral TID      LOS: 1 day   Rayhana Slider M.D. [facility] 03/18/2014, 11:54 AM Pager: [phone]  If 7PM-7AM, please contact night-coverage [email or web address] Password [email or web address]

## 2014-03-18 NOTE — Progress Notes (Signed)
Clinical Social Work Department BRIEF PSYCHOSOCIAL ASSESSMENT 03/18/2014  Patient:  Russell [name], Russell [name]     Account Number:  [id number]     [place] date:  03/17/2014  Clinical Social Worker:  Lacie Scotts  Date/Time:  03/18/2014 03:24 PM  Referred by:  Physician  Date Referred:  03/18/2014 Referred for  SNF Placement   Other Referral:   Interview type:  Family Other interview type:    PSYCHOSOCIAL DATA Living Status:  FACILITY Admitted from facility:  [facility] Level of care:  Assisted Living Primary support name:  Renny Remer Primary support relationship to patient:  CHILD, ADULT Degree of support available:   supportive    CURRENT CONCERNS Current Concerns  Post-Acute Placement   Other Concerns:    SOCIAL WORK ASSESSMENT / PLAN Pt is a 80 yr old gentleman admitted from [facility] after falling from his w/c. Pt has pubic ramus fractures as well as a greater trochanteric fracture nondisplaced. CSW met briefly with pt this am. Son was contacted, as well, to offer assistance with d/c planning. Ortho consult  indicates pt does not require surgery at this time. Son reports that pt does not ambulate at ALF. PT consult is pending. Recommendations will be reviewed with pt son once available.  Clinicals have been provided to [facility], at son's request. CSW will continue to follow to assist with d/c planning ( ALF vs  SNF ).   Assessment/plan status:  Psychosocial Support/Ongoing Assessment of Needs Other assessment/ plan:   Information/referral to community resources:   ALF vs SNF ( benefits of each plan reviewed).  Insurance coverage for SNF / ambulance transport reviewed.    PATIENT'S/FAMILY'S RESPONSE TO PLAN OF CARE: Pt / son are willing to consider all recommendations. Pt has been to [facility] in the past. Pt / son would like to consider pt returning to [facility] if ALF is able to meet pt's needs. Clinicals  sent to ALF. Message left for Resident Care coordinator to contact CSW to review d/c needs. D/c planning is ongoing.  Werner Lean LCSW [phone]   Werner Lean LCSW [phone]

## 2014-03-18 NOTE — Care Management Note (Signed)
    Page 1 of 1   03/18/2014     2:43:58 PM CARE MANAGEMENT NOTE 03/18/2014  Patient:  Eston EstersMORGAN,Russell A   Account Number:  [id number]  Date Initiated:  03/18/2014  Documentation initiated by:  [facility]FFRIES,Bostyn Bogie  Subjective/Objective Assessment:   adm: Right greater trochanteric and superior and inferior pubic ramus fractures     Action/Plan:   discharge planning   Anticipated DC Date:  03/19/2014   Anticipated DC Plan:  SKILLED NURSING FACILITY         Choice offered to / List presented to:             Status of service:  Completed, signed off Medicare Important Message given?   (If response is "NO", the following Medicare IM given date fields will be blank) Date Medicare IM given:   Medicare IM given by:   Date Additional Medicare IM given:   Additional Medicare IM given by:    Discharge Disposition:  SKILLED NURSING FACILITY  Per UR Regulation:    If discussed at Long Length of Stay Meetings, dates discussed:    Comments:  03/18/14 14:40 CM notes pt to go to SNF;CSW arranging.  No other CM needs were communicated.  Freddy JakschSarah Orvin Netter, BSN, CM [phone].

## 2014-03-18 NOTE — Progress Notes (Signed)
UR completed 

## 2014-03-18 NOTE — Plan of Care (Signed)
Problem: Consults Goal: Hip/Femur Fracture Patient Education See Patient Education Module for education specifics.  Outcome: Completed/Met Date Met:  03/18/14 Goal: Skin Care Protocol Initiated - if Braden Score 18 or less If consults are not indicated, leave blank or document N/A  Outcome: Not Applicable Date Met:  09/47/09 Goal: Nutrition Consult-if indicated Outcome: Not Applicable Date Met:  62/83/66 Goal: Diabetes Guidelines if Diabetic/Glucose > 140 If diabetic or lab glucose is > 140 mg/dl - Initiate Diabetes/Hyperglycemia Guidelines & Document Interventions  Outcome: Not Applicable Date Met:  29/47/65  Problem: Phase I Progression Outcomes Goal: Pre op pain controlled with appropriate interventions Outcome: Completed/Met Date Met:  03/18/14 Goal: Pre op Medical MD consult, if indicated Outcome: Completed/Met Date Met:  03/18/14

## 2014-03-18 NOTE — Progress Notes (Signed)
PT Cancellation Note  Patient Details Name: Russell EstersHarold A [name] MRN: [id number] DOB: February 10, 1917   Cancelled Treatment:     PT eval deferred pending ortho consult and weight bearing status.  Will follow.   Terrence Pizana 03/18/2014, 1:05 PM

## 2014-03-18 NOTE — Consult Note (Addendum)
Reason for Consult:Right greater trochanteric and superior and inferior pubic ramus fractures Referring Physician: Hospitalists  Russell [name] is an 80 y.o. male.  HPI: Patient is a 80 year old male who is had hemiarthroplasty of the right hip in the past.  He had a fall earlier today was complaining of significant right hip pain.  He was admitted to the hospitalist service.  Plain x-rays were concerning for pubic ramus fracture and multiple a CAT scan was performed and showed greater trochanteric fracture as well as pubic ramus fractures.  He is a wheelchair ambulator and we'll consult for management of his fractures.  Past Medical History  Diagnosis Date  . Stroke   . Acute bronchitis   . Hypertension     Past Surgical History  Procedure Laterality Date  . Hernia repair    . Lumbar disc surgery    . Hip arthroplasty Right 08/26/2012    Procedure: RIGHT HEMI HIP ARTHROPLASTY ;  Surgeon: Hessie Dibble, MD;  Location: [facility];  Service: Orthopedics;  Laterality: Right;    Family History  Problem Relation Age of Onset  . CVA Mother     Social History:  reports that he has never smoked. He has never used smokeless tobacco. He reports that he does not drink alcohol or use illicit drugs.  Allergies:  Allergies  Allergen Reactions  . Ambien [Zolpidem Tartrate] Other (See Comments)    Unknown reaction  . Penicillins Other (See Comments)    Unknown reaction    Medications: I have reviewed the patient's current medications.  Results for orders placed or performed during the hospital encounter of 03/17/14 (from the past 48 hour(s))  CBC with Differential     Status: Abnormal   Collection Time: 03/17/14  2:23 PM  Result Value Ref Range   WBC 4.8 4.0 - 10.5 K/uL   RBC 2.77 (L) 4.22 - 5.81 MIL/uL   Hemoglobin 9.0 (L) 13.0 - 17.0 g/dL   HCT 26.2 (L) 39.0 - 52.0 %   MCV 94.6 78.0 - 100.0 fL   MCH 32.5 26.0 - 34.0 pg   MCHC 34.4 30.0 - 36.0 g/dL   RDW 15.2 11.5 - 15.5 %    Platelets 184 150 - 400 K/uL   Neutrophils Relative % 57 43 - 77 %   Neutro Abs 2.8 1.7 - 7.7 K/uL   Lymphocytes Relative 29 12 - 46 %   Lymphs Abs 1.4 0.7 - 4.0 K/uL   Monocytes Relative 13 (H) 3 - 12 %   Monocytes Absolute 0.6 0.1 - 1.0 K/uL   Eosinophils Relative 1 0 - 5 %   Eosinophils Absolute 0.0 0.0 - 0.7 K/uL   Basophils Relative 0 0 - 1 %   Basophils Absolute 0.0 0.0 - 0.1 K/uL  Basic metabolic panel     Status: Abnormal   Collection Time: 03/17/14  2:23 PM  Result Value Ref Range   Sodium 132 (L) 137 - 147 mEq/L   Potassium 3.7 3.7 - 5.3 mEq/L   Chloride 95 (L) 96 - 112 mEq/L   CO2 23 19 - 32 mEq/L   Glucose, Bld 116 (H) 70 - 99 mg/dL   BUN 19 6 - 23 mg/dL   Creatinine, Ser 1.38 (H) 0.50 - 1.35 mg/dL   Calcium 8.7 8.4 - 10.5 mg/dL   GFR calc non Af Amer 42 (L) >90 mL/min   GFR calc Af Amer 48 (L) >90 mL/min    Comment: (NOTE) The eGFR has been calculated  using the CKD EPI equation. This calculation has not been validated in all clinical situations. eGFR's persistently <90 mL/min signify possible Chronic Kidney Disease.    Anion gap 14 5 - 15  CBC     Status: Abnormal   Collection Time: 03/18/14  5:13 AM  Result Value Ref Range   WBC 4.7 4.0 - 10.5 K/uL   RBC 2.59 (L) 4.22 - 5.81 MIL/uL   Hemoglobin 8.4 (L) 13.0 - 17.0 g/dL   HCT 25.0 (L) 39.0 - 52.0 %   MCV 96.5 78.0 - 100.0 fL   MCH 32.4 26.0 - 34.0 pg   MCHC 33.6 30.0 - 36.0 g/dL   RDW 15.5 11.5 - 15.5 %   Platelets 156 150 - 400 K/uL    Dg Hip Complete Right  03/17/2014   CLINICAL DATA:  Fall on buttocks last night. Right hip pain. Unable to bear weight on right leg.  EXAM: RIGHT HIP - COMPLETE 2+ VIEW  COMPARISON:  08/25/2012  FINDINGS: Patient is status post right hip replacement. Old healed right femoral midshaft fracture. Suspect a nondisplaced superior pubic ramus fracture on the right. No additional acute bony abnormality. Mild diffuse osteopenia.  IMPRESSION: Suspected nondisplaced right superior  pubic ramus fracture.  Prior right hip replacement.  No hardware complicating feature.   Electronically Signed   By: Kevin  Dover M.D.   On: 03/17/2014 13:46   Dg Femur Right  03/17/2014   CLINICAL DATA:  89-year-old male with history of trauma after a fall onto the buttocks region last night complaining of right-sided hip pain. Unable to bear weight on the right leg.  EXAM: RIGHT FEMUR - 2 VIEW  COMPARISON:  08/25/2012.  FINDINGS: There continues to be a subtle lucency in the right superior pubic ramus, suspicious for a nondisplaced fracture. Status post right hip bipolar hemiarthroplasty. The prosthetic femoral head is properly located in the acetabulum. No periprosthetic fracture is identified. There is a healed fracture of the mid femoral diaphysis with mild posttraumatic deformity.  IMPRESSION: 1. No acute radiographic abnormality of the right femur. 2. Findings remain suspicious for a subtle nondisplaced fracture of the right superior pubic ramus. This could be confirmed with noncontrast CT if clinically appropriate. 3. Additional findings, as above.   Electronically Signed   By: Daniel  Entrikin M.D.   On: 03/17/2014 15:54   Ct Head Wo Contrast  03/17/2014   CLINICAL DATA:  89-year-old who fell backward yesterday while at the nursing home, unwitnessed fall.  EXAM: CT HEAD WITHOUT CONTRAST  TECHNIQUE: Contiguous axial images were obtained from the base of the skull through the vertex without intravenous contrast.  COMPARISON:  09/11/2012, 03/18/2012.  FINDINGS: Severe cortical, deep and cerebellar atrophy, not significantly changed. Moderate to severe changes of small vessel disease of the white matter diffusely, unchanged. No mass lesion. No midline shift. No acute hemorrhage or hematoma. No extra-axial fluid collections. No evidence of acute infarction. No significant interval change.  No skull fracture or other focal osseous abnormality involving the skull. Visualized paranasal sinuses, bilateral  mastoid air cells and bilateral middle ear cavities well-aerated. Severe vertebrobasilar and bilateral carotid siphon atherosclerosis.  IMPRESSION: 1. No acute intracranial abnormality. 2. Stable severe generalized atrophy and moderate to severe chronic microvascular ischemic changes of the white matter.   Electronically Signed   By: Thomas  Lawrence M.D.   On: 03/17/2014 16:34   Ct Hip Right Wo Contrast  03/17/2014   CLINICAL DATA:  Right hip pain status post fall. History   of arthroplasty. Initial encounter.  EXAM: CT OF THE RIGHT HIP WITHOUT CONTRAST  TECHNIQUE: Multidetector CT imaging of the right hip was performed according to the standard protocol. Multiplanar CT image reconstructions were also generated.  COMPARISON:  Radiographs today.  Pelvic CT 02/06/2012.  FINDINGS: Examination is limited to the right hip and inferior right hemipelvis. The entire pelvis is not imaged.  Patient is status post right hip hemiarthroplasty. There is no dislocation. The hardware creates moderate beam hardening artifact, limiting osseous evaluation. However, no definite acute fracture of the pubic rami is demonstrated as questioned on the prior radiographs. There is however a mildly displaced acute-appearing fracture of the greater trochanter. This is best demonstrated on the reformatted images. The proximal diaphysis appears intact. There is no hardware loosening. Chronic posttraumatic deformity of the proximal femoral diaphysis distal to the prosthesis is not imaged.  There is no large proximal thigh or right pelvic hematoma. Vascular calcifications are noted.  IMPRESSION: 1. Acute mildly displaced fracture of the right greater trochanter. No significant inferior extension of the fracture into the diaphysis or hardware loosening. 2. No dislocation. 3. No definite pubic rami fractures. This area is not optimally evaluated due to artifact from the arthroplasty.   Electronically Signed   By: Bill  Veazey M.D.   On: 03/17/2014  19:19    ROS  ROS: I have reviewed the patient's review of systems thoroughly and there are no positive responses as relates to the HPI. Blood pressure 114/63, pulse 66, temperature 99.1 F (37.3 C), temperature source Oral, resp. rate 18, SpO2 98 %. Physical Exam Well-developed well-nourished patient in no acute distress. Alert and oriented x3 HEENT:within normal limits Cardiac: Regular rate and rhythm Pulmonary: Lungs clear to auscultation Abdomen: Soft and nontender.  Normal active bowel sounds  Musculoskeletal: (Right hip: Mild pain to range of motion.  Mild pain with heel strike.  Neurovascular intact distally. Sig internal rotation of r leg   Assessment/Plan: 89-year-old wheelchair ambulator with previous hemiarthroplasty, after old fracture of femur, of the hip who fell and now has pubic ramus fractures as well as greater trochanteric fracture nondisplaced.//  This is not a surgical problem. At this point there is no limitation to the patient's ability to move bed to chair.  He would also be able to ambulate were that reasonable for the patient.  He should avoid active abduction at this point.  We will see him back in the office in 2 weeks for evaluation of his fractures.  Will follow patient in hospital.  May be bed to chair  , L 03/18/2014, 1:27 PM      

## 2014-03-19 DIAGNOSIS — S329XXD Fracture of unspecified parts of lumbosacral spine and pelvis, subsequent encounter for fracture with routine healing: Secondary | ICD-10-CM

## 2014-03-19 LAB — BASIC METABOLIC PANEL
Anion gap: 12 (ref 5–15)
BUN: 18 mg/dL (ref 6–23)
CALCIUM: 8 mg/dL — AB (ref 8.4–10.5)
CO2: 23 mEq/L (ref 19–32)
Chloride: 99 mEq/L (ref 96–112)
Creatinine, Ser: 1.31 mg/dL (ref 0.50–1.35)
GFR calc Af Amer: 52 mL/min — ABNORMAL LOW (ref >90)
GFR calc non Af Amer: 45 mL/min — ABNORMAL LOW (ref >90)
GLUCOSE: 88 mg/dL (ref 70–99)
Potassium: 3.9 mEq/L (ref 3.7–5.3)
SODIUM: 134 meq/L — AB (ref 137–147)

## 2014-03-19 LAB — CBC
HEMATOCRIT: 23.5 % — AB (ref 39.0–52.0)
HEMOGLOBIN: 8.1 g/dL — AB (ref 13.0–17.0)
MCH: 32.9 pg (ref 26.0–34.0)
MCHC: 34.5 g/dL (ref 30.0–36.0)
MCV: 95.5 fL (ref 78.0–100.0)
Platelets: 169 10*3/uL (ref 150–400)
RBC: 2.46 MIL/uL — ABNORMAL LOW (ref 4.22–5.81)
RDW: 15.5 % (ref 11.5–15.5)
WBC: 4.3 10*3/uL (ref 4.0–10.5)

## 2014-03-19 MED ORDER — HYDROCODONE-ACETAMINOPHEN 5-325 MG PO TABS: 1.0000 | ORAL_TABLET | ORAL | Status: DC | PRN

## 2014-03-19 MED ORDER — TRAMADOL HCL 50 MG PO TABS: 75.0000 mg | ORAL_TABLET | Freq: Three times a day (TID) | ORAL | Status: DC

## 2014-03-19 MED ORDER — ALPRAZOLAM 0.25 MG PO TABS: 0.2500 mg | ORAL_TABLET | Freq: Two times a day (BID) | ORAL | Status: DC

## 2014-03-19 NOTE — Evaluation (Signed)
Physical Therapy Evaluation Patient Details Name: Russell EstersHarold A [name] MRN: [id number] DOB: May 23, 1916 Today's Date: 03/19/2014   History of Present Illness    Patient is a 80 year old male who is had hemiarthroplasty of the right hip in the past. He had a fall earlier today was complaining of significant right hip pain. He was admitted to the hospitalist service. Plain x-rays were concerning for pubic ramus fracture and multiple a CAT scan was performed and showed greater trochanteric fracture as well as pubic ramus fractures.   Clinical Impression  Pt currently expressing ltd motivation to work with rehab but [facility]  Pt currently mobilizing at mod assist of 2 for safety and physical assist.  [facility]    Follow Up Recommendations SNF    Equipment Recommendations  None recommended by PT    Recommendations for Other Services       Precautions / Restrictions Precautions Precautions: Fall Precaution Comments: NO active abduction on right per Ortho consult note Restrictions Weight Bearing Restrictions: No Other Position/Activity Restrictions: WBAT      Mobility  Bed Mobility Overal bed mobility: Needs Assistance Bed Mobility: Supine to Sit;Sit to Supine     Supine to sit: Mod assist;+2 for physical assistance;+2 for safety/equipment Sit to supine: Mod assist;+2 for physical assistance;+2 for safety/equipment   General bed mobility comments: Pt assisted to/from EOB with pad  Transfers Overall transfer level: Needs assistance Equipment used: None (pt refused to use walker) Transfers: Squat Pivot Transfers     Squat pivot transfers: +2 safety/equipment;Min assist;Mod assist     General transfer comment: assist with R LE, to steady and to guide over  Ambulation/Gait             General Gait Details: Pt refuses to attempt  Stairs            Wheelchair Mobility    Modified Rankin (Stroke Patients  Only)       Balance                                             Pertinent Vitals/Pain Pain Assessment: Faces Pain Score: 3  Pain Location: R LE Pain Intervention(s): Limited activity within patient's tolerance;Monitored during session    Home Living Family/patient expects to be discharged to:: Skilled nursing facility                 Additional Comments: Back to [facility]    Prior Function Level of Independence: Needs assistance         Comments: Very ltd ambulator prior to fall     Hand Dominance        Extremity/Trunk Assessment   Upper Extremity Assessment: Overall WFL for tasks assessed           Lower Extremity Assessment: RLE deficits/detail RLE Deficits / Details: Pt unwilling to attempt ROM.    Cervical / Trunk Assessment: Kyphotic  Communication   Communication: HOH  Cognition Arousal/Alertness: Awake/alert Behavior During Therapy: WFL for tasks assessed/performed Overall Cognitive Status: Within Functional Limits for tasks assessed                      General Comments      Exercises        Assessment/Plan    PT Assessment Patient needs continued PT services  PT Diagnosis  Difficulty walking   PT Problem List Decreased range of motion;Decreased strength;Decreased activity tolerance;Decreased mobility;Decreased knowledge of use of DME;Pain  PT Treatment Interventions DME instruction;Gait training;Functional mobility training;Therapeutic activities;Therapeutic exercise;Patient/family education   PT Goals (Current goals can be found in the Care Plan section) Acute Rehab PT Goals Patient Stated Goal: Stay in bed until my leg gets better Time For Goal Achievement: 03/26/14 Potential to Achieve Goals: Poor (2* lack of interest/motivation to participate with therapy)    Frequency Min 2X/week   Barriers to discharge        Co-evaluation               End of Session   Activity Tolerance:  Other (comment) (ltd by pt's unwillingness to cooperate) Patient left: in bed Nurse Communication: Mobility status    Functional Assessment Tool Used: Clinical judgement Functional Limitation: Mobility: Walking and moving around Mobility: Walking and Moving Around Current Status ([id number]): At least 60 percent but less than 80 percent impaired, limited or restricted Mobility: Walking and Moving Around Goal Status [phone]([id number]): At least 40 percent but less than 60 percent impaired, limited or restricted    Time: 1041-1055 PT Time Calculation (min) (ACUTE ONLY): 14 min   Charges:   PT Evaluation $Initial PT Evaluation Tier I: 1 Procedure     PT G Codes:   Functional Assessment Tool Used: Clinical judgement Functional Limitation: Mobility: Walking and moving around    Medina HospitalBRADSHAW,Treylen Gibbs 03/19/2014, 11:49 AM

## 2014-03-19 NOTE — Progress Notes (Signed)
[facility] PT recommended [facility] transport. Pt / family are in agreement with d/c plan. Pt's son , Dr. Lequita HaltMorgan, notified and reassured that clinical information had been sent to  Wellness coordinator at ALF and was approved for readmission. Son is concerned that pt will need LTC in the near future. Resources provided to son, as well as CSW cell # in case additional assistance is needed.  Cori RazorJamie Azarya Oconnell LCSW [phone]

## 2014-03-19 NOTE — Discharge Summary (Addendum)
Physician Discharge Summary  Patient ID: Russell [name] MRN: [id number] DOB/AGE: 1917/02/02 80 y.o.  Admit date: 03/17/2014 Discharge date: 03/19/2014  Primary Care Physician:  Pcp Not In System  Discharge Diagnoses:    Right greater trochanter fracture   superior and inferior pubic rami fracture  . Anemia . BPH (benign prostatic hyperplasia) . Chronic kidney disease . Fall   Consults: Orthopedics, Dr. Luiz BlareGraves   Recommendations for Outpatient Follow-up:   Per orthopedics recommendation, this is not a surgical problem.  Should avoid active abduction at this time, continue to work with physical therapy Follow up in office in 2 weeks May be bed to chair    DIET: Soft diet    Allergies:   Allergies  Allergen Reactions  . Ambien [Zolpidem Tartrate] Other (See Comments)    Unknown reaction  . Penicillins Other (See Comments)    Unknown reaction     Discharge Medications:   Medication List    TAKE these medications        acetaminophen 500 MG tablet  Commonly known as:  TYLENOL  Take 500 mg by mouth 3 (three) times daily.     ALPRAZolam 0.25 MG tablet  Commonly known as:  XANAX  Take 1 tablet (0.25 mg total) by mouth 2 (two) times daily. Take one tablet by mouth every 6 hours as needed for anxiety     diclofenac sodium 1 % Gel  Commonly known as:  VOLTAREN  Apply topically 3 (three) times daily. Apply to thumbs     divalproex 125 MG capsule  Commonly known as:  DEPAKOTE SPRINKLE  Take 250 mg by mouth every evening.     ENSURE PLUS Liqd  Take 237 mLs by mouth 2 (two) times daily.     finasteride 5 MG tablet  Commonly known as:  PROSCAR  Take 5 mg by mouth at bedtime.     furosemide 20 MG tablet  Commonly known as:  LASIX  Take 10 mg by mouth daily.     guaifenesin 100 MG/5ML syrup  Commonly known as:  ROBITUSSIN  Take 200 mg by mouth every 6 (six) hours as needed for cough.     HYDROcodone-acetaminophen 5-325 MG per tablet  Commonly known as:   NORCO  Take 1 tablet by mouth every 4 (four) hours as needed for severe pain.     hydrocortisone 2.5 % rectal cream  Commonly known as:  ANUSOL-HC  Place 1 application rectally 2 (two) times daily as needed for hemorrhoids or itching.     multivitamins with iron Tabs tablet  Take 1 tablet by mouth daily.     omeprazole 20 MG capsule  Commonly known as:  PRILOSEC  Take 20 mg by mouth daily.     polyethylene glycol packet  Commonly known as:  MIRALAX / GLYCOLAX  Take 17 g by mouth daily.     QUEtiapine 25 MG tablet  Commonly known as:  SEROQUEL  Take 12.5 mg by mouth daily.     senna-docusate 8.6-50 MG per tablet  Commonly known as:  Senokot-S  Take 1 tablet by mouth 2 (two) times daily.     sertraline 50 MG tablet  Commonly known as:  ZOLOFT  Take 50 mg by mouth daily.     terazosin 5 MG capsule  Commonly known as:  HYTRIN  Take 5 mg by mouth at bedtime.     traMADol 50 MG tablet  Commonly known as:  ULTRAM  Take 1.5 tablets (75 mg total)  by mouth 3 (three) times daily.         Brief H and P: For complete details please refer to admission H and P, but in brief patient is a 80 year male with dementia, stroke, hypertension, anemia, CAD, BPH, right hip hemiarthroplasty in 2014, wheelchair bound with occasional transfer to chair or bed was brought in from assisted living facility after he had a mechanical fall while he was cleaning his bathroom. Patient landed on his right hip, denied sustaining any other injury. He denied loss of consciousness. Patient reported pain in his right hip with inability to flex his hip or knee. He was admitted for further workup.  Hospital Course:  Mechanical fall with fracture of right hip, superior and inferior pubic rami fracture X-ray of the hip showed suspected nondisplaced right superior pubic rami fracture, right femur x-ray showed no acute radiographic abnormality but possibility of subtle nondisplaced fracture of the right superior pubic  rami. Orthopedics was consulted. CT of the right hip showed acute mildly displaced fracture of the right greater trochanter, no significant inferior extension of the fracture into the diathesis or hardware loosening. Patient was seen by Dr. Luiz Blare who did not feel that this was a surgical problem at this time. Patient was recommended to work with physical therapy, bed to chair, avoid active abduction. Patient will need to follow up with Dr. Luiz Blare in 2 weeks for the evaluation of the fractures. Patient was seen by physical therapy, however after multiple attempts, patient refused to mobilize at all. This was discussed in detail with patient's son, Dr Antonieta Loveless who is a physician in [place]. Patient's son also reported that patient has been very stubborn with physical therapy and if he is not going to work with physical therapy, there is no point of finding a new skilled nursing facility. He preferred his father to go back to ALF and work with PT over there in familiar surroundings in light with his dementia. Social work was consulted, who contacted his ALF and were willing to provide physical therapy.  Anemia Currently stable  CK D stage III Currently stable  BPH Continue finasteride, Hytrin  Dementia Continue Seroquel, Zoloft  Day of Discharge BP 136/59 mmHg  Pulse 61  Temp(Src) 98.6 F (37 C) (Oral)  Resp 18  Wt 62.596 kg (138 lb)  SpO2 98%  Physical Exam: General: Alert and awake oriented x2 not in any acute distress. CVS: S1-S2 clear no murmur rubs or gallops Chest: clear to auscultation bilaterally, no wheezing rales or rhonchi Abdomen: soft nontender, nondistended, normal bowel sounds Extremities: no cyanosis, clubbing or edema noted bilaterally    The results of significant diagnostics from this hospitalization (including imaging, microbiology, ancillary and laboratory) are listed below for reference.    LAB RESULTS: Basic Metabolic Panel:  Recent Labs Lab  03/17/14 1423 03/19/14 0450  NA 132* 134*  K 3.7 3.9  CL 95* 99  CO2 23 23  GLUCOSE 116* 88  BUN 19 18  CREATININE 1.38* 1.31  CALCIUM 8.7 8.0*   Liver Function Tests: No results for input(s): AST, ALT, ALKPHOS, BILITOT, PROT, ALBUMIN in the last 168 hours. No results for input(s): LIPASE, AMYLASE in the last 168 hours. No results for input(s): AMMONIA in the last 168 hours. CBC:  Recent Labs Lab 03/17/14 1423 03/18/14 0513 03/19/14 0450  WBC 4.8 4.7 4.3  NEUTROABS 2.8  --   --   HGB 9.0* 8.4* 8.1*  HCT 26.2* 25.0* 23.5*  MCV 94.6 96.5  95.5  PLT 184 156 169   Cardiac Enzymes: No results for input(s): CKTOTAL, CKMB, CKMBINDEX, TROPONINI in the last 168 hours. BNP: Invalid input(s): POCBNP CBG: No results for input(s): GLUCAP in the last 168 hours.  Significant Diagnostic Studies:  Dg Hip Complete Right  2014-04-03   CLINICAL DATA:  Fall on buttocks last night. Right hip pain. Unable to bear weight on right leg.  EXAM: RIGHT HIP - COMPLETE 2+ VIEW  COMPARISON:  08/25/2012  FINDINGS: Patient is status post right hip replacement. Old healed right femoral midshaft fracture. Suspect a nondisplaced superior pubic ramus fracture on the right. No additional acute bony abnormality. Mild diffuse osteopenia.  IMPRESSION: Suspected nondisplaced right superior pubic ramus fracture.  Prior right hip replacement.  No hardware complicating feature.   Electronically Signed   By: Charlett Nose M.D.   On: 04-03-2014 13:46   Dg Femur Right  04/03/2014   CLINICAL DATA:  80 year old male with history of trauma after a fall onto the buttocks region last night complaining of right-sided hip pain. Unable to bear weight on the right leg.  EXAM: RIGHT FEMUR - 2 VIEW  COMPARISON:  08/25/2012.  FINDINGS: There continues to be a subtle lucency in the right superior pubic ramus, suspicious for a nondisplaced fracture. Status post right hip bipolar hemiarthroplasty. The prosthetic femoral head is properly  located in the acetabulum. No periprosthetic fracture is identified. There is a healed fracture of the mid femoral diaphysis with mild posttraumatic deformity.  IMPRESSION: 1. No acute radiographic abnormality of the right femur. 2. Findings remain suspicious for a subtle nondisplaced fracture of the right superior pubic ramus. This could be confirmed with noncontrast CT if clinically appropriate. 3. Additional findings, as above.   Electronically Signed   By: Trudie Reed M.D.   On: April 03, 2014 15:54   Ct Head Wo Contrast  2014-04-03   CLINICAL DATA:  80 year old who fell backward yesterday while at the nursing home, unwitnessed fall.  EXAM: CT HEAD WITHOUT CONTRAST  TECHNIQUE: Contiguous axial images were obtained from the base of the skull through the vertex without intravenous contrast.  COMPARISON:  09/11/2012, 03/18/2012.  FINDINGS: Severe cortical, deep and cerebellar atrophy, not significantly changed. Moderate to severe changes of small vessel disease of the white matter diffusely, unchanged. No mass lesion. No midline shift. No acute hemorrhage or hematoma. No extra-axial fluid collections. No evidence of acute infarction. No significant interval change.  No skull fracture or other focal osseous abnormality involving the skull. Visualized paranasal sinuses, bilateral mastoid air cells and bilateral middle ear cavities well-aerated. Severe vertebrobasilar and bilateral carotid siphon atherosclerosis.  IMPRESSION: 1. No acute intracranial abnormality. 2. Stable severe generalized atrophy and moderate to severe chronic microvascular ischemic changes of the white matter.   Electronically Signed   By: Hulan Saas M.D.   On: 2014/04/03 16:34   Ct Hip Right Wo Contrast  04-03-2014   CLINICAL DATA:  Right hip pain status post fall. History of arthroplasty. Initial encounter.  EXAM: CT OF THE RIGHT HIP WITHOUT CONTRAST  TECHNIQUE: Multidetector CT imaging of the right hip was performed according to  the standard protocol. Multiplanar CT image reconstructions were also generated.  COMPARISON:  Radiographs today.  Pelvic CT 02/06/2012.  FINDINGS: Examination is limited to the right hip and inferior right hemipelvis. The entire pelvis is not imaged.  Patient is status post right hip hemiarthroplasty. There is no dislocation. The hardware creates moderate beam hardening artifact, limiting osseous evaluation. However, no definite  acute fracture of the pubic rami is demonstrated as questioned on the prior radiographs. There is however a mildly displaced acute-appearing fracture of the greater trochanter. This is best demonstrated on the reformatted images. The proximal diaphysis appears intact. There is no hardware loosening. Chronic posttraumatic deformity of the proximal femoral diaphysis distal to the prosthesis is not imaged.  There is no large proximal thigh or right pelvic hematoma. Vascular calcifications are noted.  IMPRESSION: 1. Acute mildly displaced fracture of the right greater trochanter. No significant inferior extension of the fracture into the diaphysis or hardware loosening. 2. No dislocation. 3. No definite pubic rami fractures. This area is not optimally evaluated due to artifact from the arthroplasty.   Electronically Signed   By: Roxy HorsemanBill  Veazey M.D.   On: 03/17/2014 19:19      Disposition and Follow-up: Discharge Instructions    Diet - low sodium heart healthy    Complete by:  As directed      Increase activity slowly    Complete by:  As directed             DISPOSITION: ALF with PT, OT, home health aide   DISCHARGE FOLLOW-UP Follow-up Information    Follow up with GRAVES,JOHN L, MD. Schedule an appointment as soon as possible for a visit in 2 weeks.   Specialty:  Orthopedic Surgery   Why:  for hospital follow-up   Contact information:   [address] [place] [id number] [phone]        Time spent on Discharge: 35 minutes  Signed:   RAI,RIPUDEEP  M.D. [facility] 03/19/2014, 1:27 PM Pager: [phone]

## 2014-03-19 NOTE — Progress Notes (Signed)
Clinical Social Work Department CLINICAL SOCIAL WORK PLACEMENT NOTE 03/19/2014  Patient:  Russell [name],[name]  Account Number:  [id number] Admit date:  03/17/2014  Clinical Social Worker:  Cori RazorJAMIE Cheikh Bramble, LCSW  Date/time:  03/19/2014 04:04 PM  Clinical Social Work is seeking post-discharge placement for this patient at the following level of care:   ASSISTED LIVING/REST HOME   (*CSW will update this form in Epic as items are completed)     Patient/family provided with [facility] Department of Clinical Social Work's list of facilities offering this level of care within the geographic area requested by the patient (or if unable, by the patient's family).    Patient/family informed of their freedom to choose among providers that offer the needed level of care, that participate in Medicare, Medicaid or managed care program needed by the patient, have an available bed and are willing to accept the patient.    Patient/family informed of [facility]' ownership interest in [facility], as well as of the fact that they are under no obligation to receive care at this facility.  PASARR submitted to EDS on  PASARR number received on   FL2 transmitted to all facilities in geographic area requested by pt/family on  03/19/2014 FL2 transmitted to all facilities within larger geographic area on   Patient informed that his/her managed care company has contracts with or will negotiate with  certain facilities, including the following:     Patient/family informed of bed offers received:  03/19/2014 [facility] Physician recommends and patient chooses bed at    [facility] [place] on  03/19/2014 Patient to be transferred to facility by [facility] Patient and family notified of transfer on 03/19/2014 Name of family member notified:  SON, Dr. Lequita HaltMorgan  The following physician request were entered in Epic:   Additional  Comments: NSG reviewed D/C Summary, scripots, avs. Scripts included in D/C packet.  Cori RazorJamie Shaunta Oncale LCSW [phone]

## 2014-08-07 IMAGING — CR DG CHEST 2V
2 series · 2 of 2 positions shown · non-contrast
Comparison: 02/06/2012

CLINICAL DATA: Pneumonia, shortness of breath.

CHEST - 2 VIEW

[w chest lat]
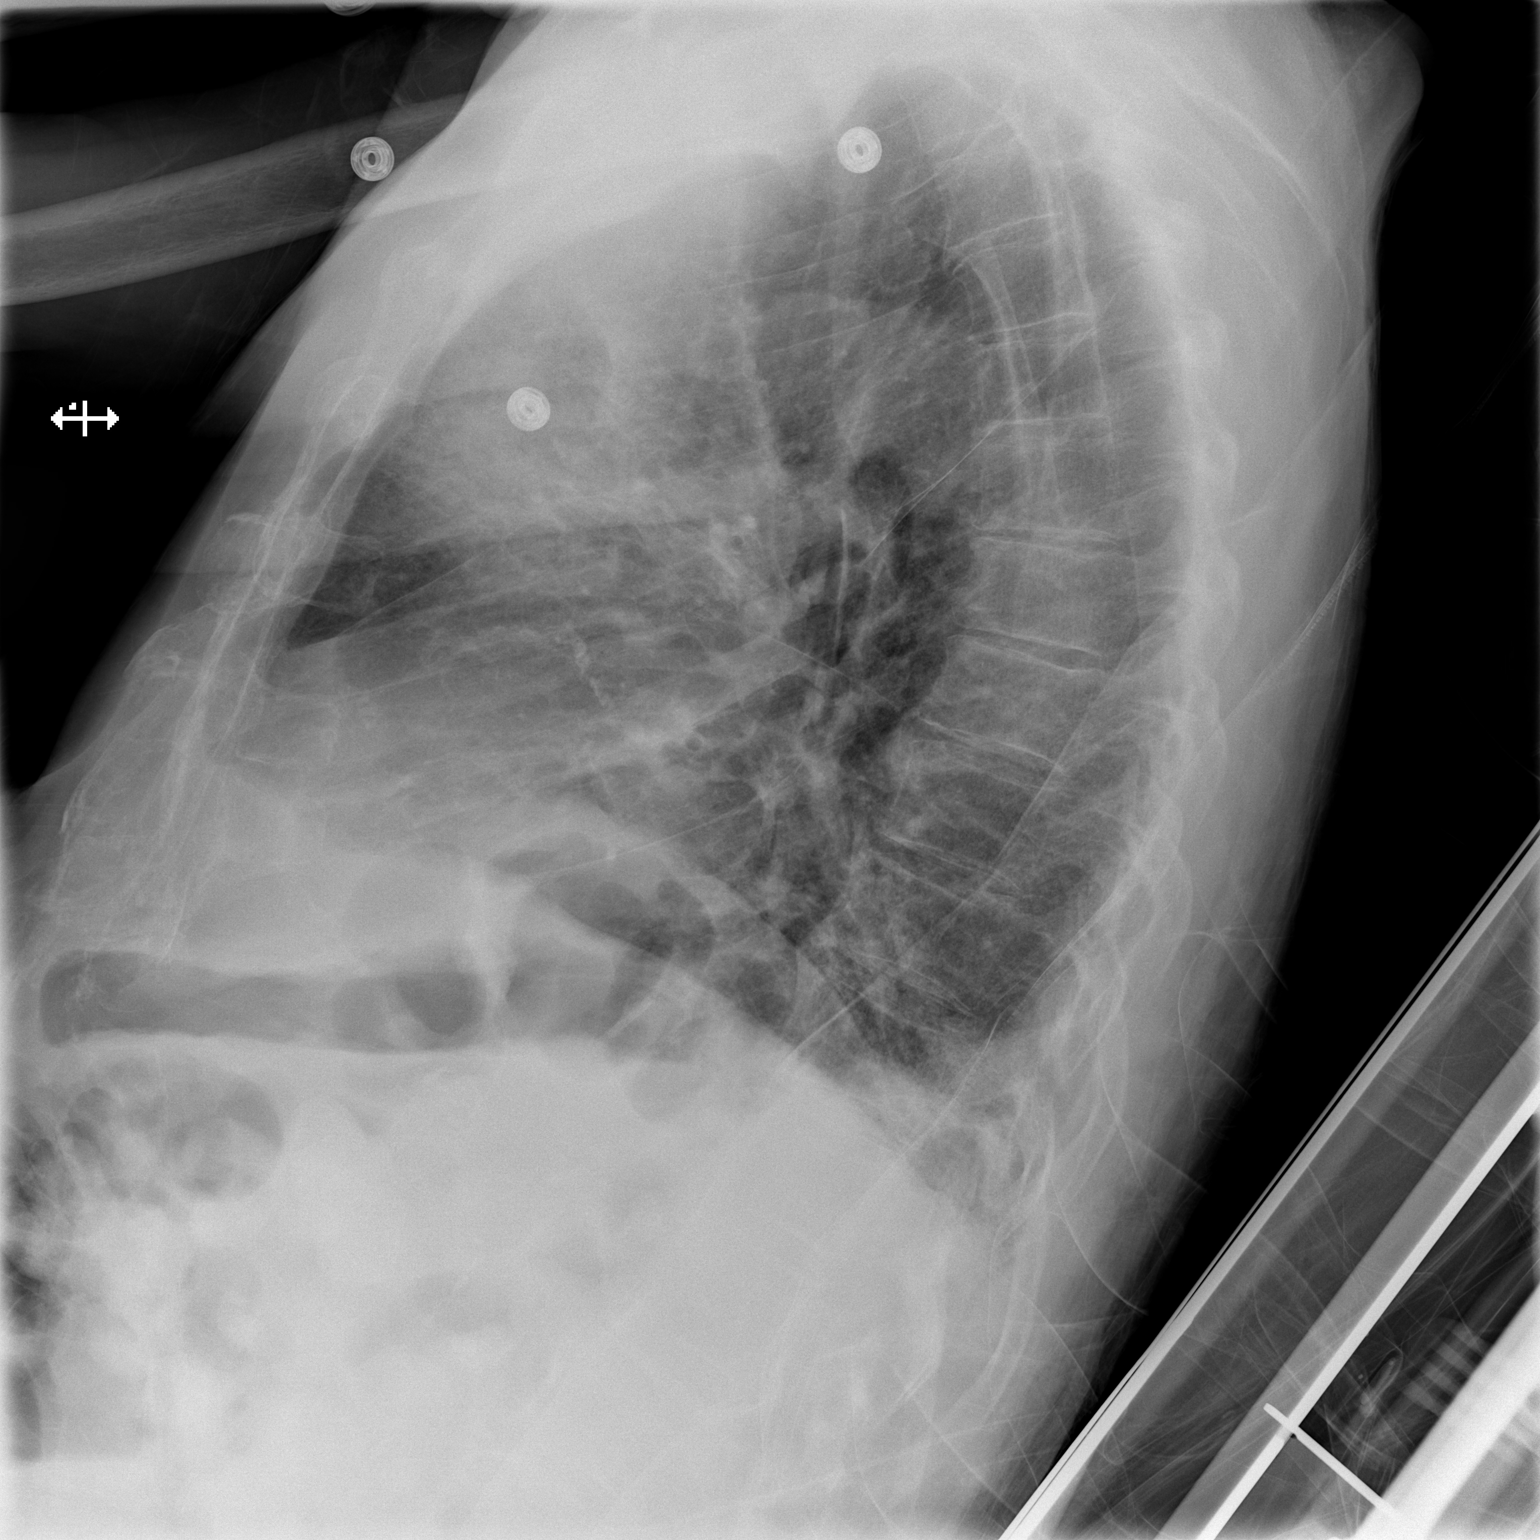

[x chest ap]
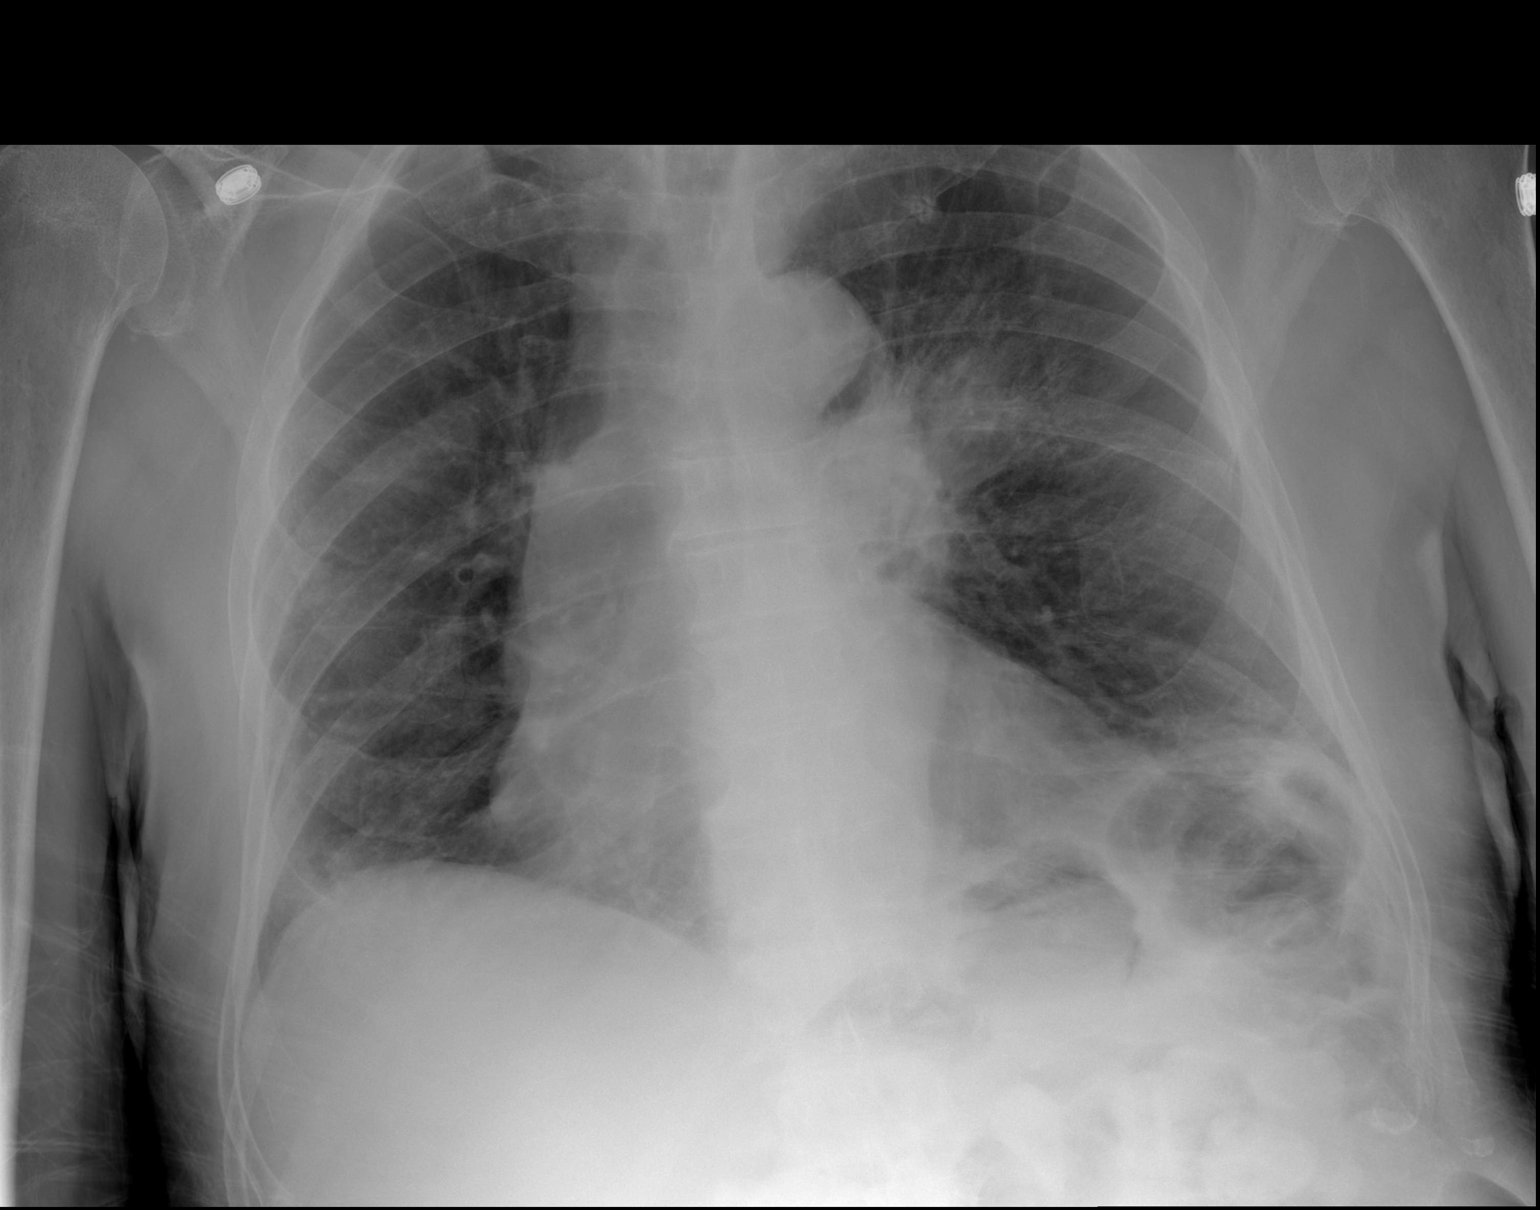

[2 of 2 positions shown; findings below may reference images not displayed]

FINDINGS: Findings concerning for possible left hilar or perihilar
mass.  Increasing density in the left upper lobe perihilar region.
While the above findings could reflect pneumonia, I cannot exclude
central obstructing mass with postobstructive process.  Recommend
CT with IV contrast for further evaluation.

Mild cardiomegaly.  Bibasilar atelectasis.  No effusions or acute
bony abnormality.
IMPRESSION: Findings concerning for possible left hilar/perihilar mass and
postobstructive process.  Recommend chest CT with IV contrast for
further evaluation.

## 2014-09-11 ENCOUNTER — Encounter (HOSPITAL_COMMUNITY): Payer: Self-pay | Admitting: *Deleted

## 2014-09-11 ENCOUNTER — Inpatient Hospital Stay (HOSPITAL_COMMUNITY)
Admission: EM | Admit: 2014-09-11 | Discharge: 2014-09-14 | DRG: 178 | Disposition: A | Discharge status: Nursing Home (Includes ICF) | Payer: Medicare Other | Attending: Internal Medicine | Admitting: Internal Medicine

## 2014-09-11 ENCOUNTER — Emergency Department (HOSPITAL_COMMUNITY): Payer: Medicare Other

## 2014-09-11 DIAGNOSIS — J690 Pneumonitis due to inhalation of food and vomit: Principal | ICD-10-CM | POA: Diagnosis present

## 2014-09-11 DIAGNOSIS — I129 Hypertensive chronic kidney disease with stage 1 through stage 4 chronic kidney disease, or unspecified chronic kidney disease: Secondary | ICD-10-CM | POA: Diagnosis present

## 2014-09-11 DIAGNOSIS — N184 Chronic kidney disease, stage 4 (severe): Secondary | ICD-10-CM | POA: Diagnosis not present

## 2014-09-11 DIAGNOSIS — F0390 Unspecified dementia without behavioral disturbance: Secondary | ICD-10-CM | POA: Diagnosis present

## 2014-09-11 DIAGNOSIS — Z8673 Personal history of transient ischemic attack (TIA), and cerebral infarction without residual deficits: Secondary | ICD-10-CM | POA: Diagnosis not present

## 2014-09-11 DIAGNOSIS — Z96641 Presence of right artificial hip joint: Secondary | ICD-10-CM | POA: Diagnosis present

## 2014-09-11 DIAGNOSIS — N400 Enlarged prostate without lower urinary tract symptoms: Secondary | ICD-10-CM | POA: Diagnosis present

## 2014-09-11 DIAGNOSIS — Z66 Do not resuscitate: Secondary | ICD-10-CM | POA: Diagnosis present

## 2014-09-11 DIAGNOSIS — Z681 Body mass index (BMI) 19 or less, adult: Secondary | ICD-10-CM | POA: Diagnosis not present

## 2014-09-11 DIAGNOSIS — R197 Diarrhea, unspecified: Secondary | ICD-10-CM

## 2014-09-11 DIAGNOSIS — D649 Anemia, unspecified: Secondary | ICD-10-CM | POA: Diagnosis present

## 2014-09-11 DIAGNOSIS — N183 Chronic kidney disease, stage 3 (moderate): Secondary | ICD-10-CM | POA: Diagnosis present

## 2014-09-11 DIAGNOSIS — E871 Hypo-osmolality and hyponatremia: Secondary | ICD-10-CM | POA: Diagnosis present

## 2014-09-11 DIAGNOSIS — R112 Nausea with vomiting, unspecified: Secondary | ICD-10-CM | POA: Diagnosis present

## 2014-09-11 DIAGNOSIS — E860 Dehydration: Secondary | ICD-10-CM | POA: Diagnosis present

## 2014-09-11 DIAGNOSIS — J189 Pneumonia, unspecified organism: Secondary | ICD-10-CM | POA: Diagnosis not present

## 2014-09-11 DIAGNOSIS — Z79899 Other long term (current) drug therapy: Secondary | ICD-10-CM

## 2014-09-11 DIAGNOSIS — N179 Acute kidney failure, unspecified: Secondary | ICD-10-CM | POA: Diagnosis present

## 2014-09-11 DIAGNOSIS — D500 Iron deficiency anemia secondary to blood loss (chronic): Secondary | ICD-10-CM | POA: Diagnosis not present

## 2014-09-11 DIAGNOSIS — E876 Hypokalemia: Secondary | ICD-10-CM | POA: Diagnosis present

## 2014-09-11 DIAGNOSIS — D6489 Other specified anemias: Secondary | ICD-10-CM

## 2014-09-11 DIAGNOSIS — Z823 Family history of stroke: Secondary | ICD-10-CM

## 2014-09-11 DIAGNOSIS — N189 Chronic kidney disease, unspecified: Secondary | ICD-10-CM | POA: Diagnosis present

## 2014-09-11 DIAGNOSIS — R0902 Hypoxemia: Secondary | ICD-10-CM | POA: Insufficient documentation

## 2014-09-11 DIAGNOSIS — D509 Iron deficiency anemia, unspecified: Secondary | ICD-10-CM | POA: Diagnosis present

## 2014-09-11 LAB — CBC WITH DIFFERENTIAL/PLATELET
BASOS ABS: 0 10*3/uL (ref 0.0–0.1)
Basophils Relative: 0 % (ref 0–1)
EOS PCT: 6 % — AB (ref 0–5)
Eosinophils Absolute: 0.4 10*3/uL (ref 0.0–0.7)
HCT: 25.1 % — ABNORMAL LOW (ref 39.0–52.0)
HEMOGLOBIN: 8.4 g/dL — AB (ref 13.0–17.0)
Lymphocytes Relative: 13 % (ref 12–46)
Lymphs Abs: 0.9 10*3/uL (ref 0.7–4.0)
MCH: 31.9 pg (ref 26.0–34.0)
MCHC: 33.5 g/dL (ref 30.0–36.0)
MCV: 95.4 fL (ref 78.0–100.0)
MONOS PCT: 1 % — AB (ref 3–12)
Monocytes Absolute: 0.1 10*3/uL (ref 0.1–1.0)
Neutro Abs: 5.4 10*3/uL (ref 1.7–7.7)
Neutrophils Relative %: 80 % — ABNORMAL HIGH (ref 43–77)
PLATELETS: 242 10*3/uL (ref 150–400)
RBC: 2.63 MIL/uL — AB (ref 4.22–5.81)
RDW: 16.5 % — ABNORMAL HIGH (ref 11.5–15.5)
WBC Morphology: INCREASED
WBC: 6.8 10*3/uL (ref 4.0–10.5)

## 2014-09-11 LAB — COMPREHENSIVE METABOLIC PANEL
ALT: 12 U/L — AB (ref 17–63)
AST: 23 U/L (ref 15–41)
Albumin: 3.1 g/dL — ABNORMAL LOW (ref 3.5–5.0)
Alkaline Phosphatase: 38 U/L (ref 38–126)
Anion gap: 12 (ref 5–15)
BILIRUBIN TOTAL: 0.5 mg/dL (ref 0.3–1.2)
BUN: 23 mg/dL — ABNORMAL HIGH (ref 6–20)
CALCIUM: 7.8 mg/dL — AB (ref 8.9–10.3)
CO2: 22 mmol/L (ref 22–32)
Chloride: 94 mmol/L — ABNORMAL LOW (ref 101–111)
Creatinine, Ser: 1.48 mg/dL — ABNORMAL HIGH (ref 0.61–1.24)
GFR calc Af Amer: 45 mL/min — ABNORMAL LOW (ref >60)
GFR, EST NON AFRICAN AMERICAN: 38 mL/min — AB (ref >60)
Glucose, Bld: 207 mg/dL — ABNORMAL HIGH (ref 65–99)
POTASSIUM: 3.8 mmol/L (ref 3.5–5.1)
Sodium: 128 mmol/L — ABNORMAL LOW (ref 135–145)
Total Protein: 6.4 g/dL — ABNORMAL LOW (ref 6.5–8.1)

## 2014-09-11 LAB — URINALYSIS, ROUTINE W REFLEX MICROSCOPIC
Bilirubin Urine: NEGATIVE
Glucose, UA: NEGATIVE mg/dL
HGB URINE DIPSTICK: NEGATIVE
KETONES UR: NEGATIVE mg/dL
Leukocytes, UA: NEGATIVE
Nitrite: NEGATIVE
PH: 7 (ref 5.0–8.0)
Protein, ur: NEGATIVE mg/dL
Specific Gravity, Urine: 1.012 (ref 1.005–1.030)
Urobilinogen, UA: 1 mg/dL (ref 0.0–1.0)

## 2014-09-11 LAB — I-STAT CG4 LACTIC ACID, ED: LACTIC ACID, VENOUS: 1.44 mmol/L (ref 0.5–2.0)

## 2014-09-11 MED ORDER — SODIUM CHLORIDE 0.9 % IV BOLUS (SEPSIS)
500.0000 mL | Freq: Once | INTRAVENOUS | Status: AC
  Administered 2014-09-11: 500 mL via INTRAVENOUS

## 2014-09-11 MED ORDER — VANCOMYCIN HCL IN DEXTROSE 1-5 GM/200ML-% IV SOLN
1000.0000 mg | Freq: Once | INTRAVENOUS | Status: AC
  Administered 2014-09-11: 1000 mg via INTRAVENOUS
  Filled 2014-09-11: qty 200

## 2014-09-11 MED ORDER — VANCOMYCIN HCL IN DEXTROSE 750-5 MG/150ML-% IV SOLN
750.0000 mg | INTRAVENOUS | Status: DC
  Administered 2014-09-12 – 2014-09-13 (×2): 750 mg via INTRAVENOUS
  Filled 2014-09-11 (×3): qty 150

## 2014-09-11 MED ORDER — ONDANSETRON HCL 4 MG/2ML IJ SOLN
4.0000 mg | Freq: Once | INTRAMUSCULAR | Status: AC
  Administered 2014-09-11: 4 mg via INTRAVENOUS
  Filled 2014-09-11: qty 2

## 2014-09-11 MED ORDER — DEXTROSE 5 % IV SOLN
2.0000 g | Freq: Once | INTRAVENOUS | Status: AC
  Administered 2014-09-11: 2 g via INTRAVENOUS
  Filled 2014-09-11: qty 2

## 2014-09-11 NOTE — ED Provider Notes (Signed)
CSN: [id number]     Arrival date & time 09/11/14  1811 History   First MD Initiated Contact with Patient 09/11/14 1833     Chief Complaint  Patient presents with  . Nausea  . Emesis     (Consider location/radiation/quality/duration/timing/severity/associated sxs/prior Treatment) HPI Comments: Patient with a history of Stroke and HTN [facility].  He comes in today with a chief complaint of nausea and vomiting.  [facility] the patient began vomiting last evening.  He has had approximately 3-4 episodes of vomiting.  No hematemesis.  Staff also reported one episode of loose stool.  No melena or hematochezia.  Staff report that he is currently being treated for a UTI with Septra, which was started on 09/03/14, which he is still taking.  Staff also report occasional cough.  No fever, chills, SOB, chest pain, abdominal pain, or urinary symptoms.  Patient is DNR.  Patient is a 81 y.o. male presenting with vomiting. The history is provided by the patient.  Emesis   Past Medical History  Diagnosis Date  . Stroke   . Acute bronchitis   . Hypertension    Past Surgical History  Procedure Laterality Date  . Hernia repair    . Lumbar disc surgery    . Hip arthroplasty Right 08/26/2012    Procedure: RIGHT HEMI HIP ARTHROPLASTY ;  Surgeon: Velna OchsPeter G Dalldorf, MD;  Location: [facility];  Service: Orthopedics;  Laterality: Right;   Family History  Problem Relation Age of Onset  . CVA Mother    History  Substance Use Topics  . Smoking status: Never Smoker   . Smokeless tobacco: Never Used  . Alcohol Use: No    Review of Systems  Gastrointestinal: Positive for vomiting.  All other systems reviewed and are negative.     Allergies  Ambien and Penicillins  Home Medications   Prior to Admission medications   Medication Sig Start Date End Date Taking? Authorizing Provider  acetaminophen (TYLENOL) 500 MG tablet  Take 500 mg by mouth 3 (three) times daily.   Yes Historical Provider, MD  ALPRAZolam (XANAX) 0.25 MG tablet Take 1 tablet (0.25 mg total) by mouth 2 (two) times daily. Take one tablet by mouth every 6 hours as needed for anxiety 03/19/14  Yes Ripudeep K Rai, MD  capsaicin (ZOSTRIX) 0.025 % cream Apply 1 application topically 2 (two) times daily. Both Thumbs.   Yes Historical Provider, MD  divalproex (DEPAKOTE SPRINKLE) 125 MG capsule Take 250 mg by mouth every evening.   Yes Historical Provider, MD  Ensure Plus (ENSURE PLUS) LIQD Take 237 mLs by mouth 2 (two) times daily.    Yes Historical Provider, MD  finasteride (PROSCAR) 5 MG tablet Take 5 mg by mouth at bedtime.   Yes Historical Provider, MD  furosemide (LASIX) 20 MG tablet Take 10 mg by mouth daily.   Yes Historical Provider, MD  HYDROcodone-acetaminophen (NORCO) 5-325 MG per tablet Take 1 tablet by mouth every 4 (four) hours as needed for severe pain. 03/19/14  Yes Ripudeep Jenna LuoK Rai, MD  Multiple Vitamins-Iron (MULTIVITAMINS WITH IRON) TABS tablet Take 1 tablet by mouth every evening.    Yes Historical Provider, MD  ondansetron (ZOFRAN) 4 MG tablet Take 4 mg by mouth every 12 (twelve) hours as needed for nausea or vomiting.   Yes Historical Provider, MD  polyethylene glycol (MIRALAX / GLYCOLAX) packet Take 17 g by mouth daily. 02/11/12  Yes Joseph Art, DO  senna-docusate (SENOKOT-S) 8.6-50 MG per tablet Take 1 tablet by mouth every evening.    Yes Historical Provider, MD  sertraline (ZOLOFT) 50 MG tablet Take 50 mg by mouth every evening.    Yes Historical Provider, MD  sulfamethoxazole-trimethoprim (BACTRIM,SEPTRA) 400-80 MG per tablet Take 1 tablet by mouth 2 (two) times daily. For 21 Days. 09/03/14  Yes Historical Provider, MD  terazosin (HYTRIN) 5 MG capsule Take 5 mg by mouth at bedtime.   Yes Historical Provider, MD  traMADol (ULTRAM) 50 MG tablet Take 1.5 tablets (75 mg total) by mouth 3 (three) times daily. 03/19/14  Yes Ripudeep K Rai,  MD   BP 134/59 mmHg  Pulse 70  Temp(Src) 98.6 F (37 C) (Oral)  Resp 16  SpO2 94% Physical Exam  Constitutional: He appears well-developed and well-nourished.  HENT:  Head: Normocephalic and atraumatic.  Mouth/Throat: Oropharynx is clear and moist.  Neck: Normal range of motion. Neck supple.  Cardiovascular: Normal rate, regular rhythm and normal heart sounds.   Pulmonary/Chest: Effort normal and breath sounds normal.  Abdominal: Soft. Bowel sounds are normal. He exhibits no distension and no mass. There is no tenderness. There is no rebound and no guarding.  Musculoskeletal: Normal range of motion.  Neurological: He is alert.  Skin: Skin is warm and dry.  Psychiatric: He has a normal mood and affect.  Nursing note and vitals reviewed.   ED Course  Procedures (including critical care time) Labs Review Labs Reviewed  CBC WITH DIFFERENTIAL/PLATELET  COMPREHENSIVE METABOLIC PANEL  URINALYSIS, ROUTINE W REFLEX MICROSCOPIC (NOT AT [facility])  I-STAT CG4 LACTIC ACID, ED    Imaging Review Dg Chest 2 View  09/11/2014   CLINICAL DATA:  Hypoxia. Pneumonia. Vomiting. Urinary tract infection.  EXAM: CHEST  2 VIEW  COMPARISON:  09/11/2012  FINDINGS: New airspace disease is seen in the right lower lobe, consistent with pneumonia. Left lung remains grossly clear. No evidence of pleural effusion. Mild cardiomegaly and ectasia thoracic aorta are stable.  IMPRESSION: New right lower lobe airspace disease, consistent with pneumonia. Followup PA and lateral chest X-ray is recommended in 3-4 weeks following trial of antibiotic therapy to ensure resolution and exclude underlying malignancy.  Stable mild cardiomegaly.   Electronically Signed   By: Myles Rosenthal M.D.   On: 09/11/2014 19:52     EKG Interpretation None      MDM   Final diagnoses:  Hypoxic   Patient presents [facility] from [facility] due to nausea and vomiting.  He was also noted to have a cough in the ED and had a pulse  ox of 91 on RA.  CXR today showing Pneumonia.  Patient was started on IV antibiotics to treat for HCAP.  Abdomen is soft and nontender.  Patient admitted to [facility] for further management of the HCAP.    Santiago Glad, PA-C 09/12/14 0103  Blane Ohara, MD 09/13/14 Moses Manners

## 2014-09-11 NOTE — Progress Notes (Signed)
ANTIBIOTIC CONSULT NOTE - INITIAL  Pharmacy Consult for Vancomycin Indication: HCAP  Allergies  Allergen Reactions  . Ambien [Zolpidem Tartrate] Other (See Comments)    Unknown reaction  . Penicillins Other (See Comments)    Unknown reaction    Patient Measurements:    Vital Signs: Temp: 98.6 F (37 C) (06/04 1842) Temp Source: Oral (06/04 1842) BP: 144/61 mmHg (06/04 1900) Pulse Rate: 67 (06/04 1900) Intake/Output from previous day:   Intake/Output from this shift: Total I/O In: -  Out: 200 [Urine:200]  Labs:  Recent Labs  09/11/14 1937  WBC 6.8  HGB 8.4*  PLT 242  CREATININE 1.48*   CrCl cannot be calculated (Unknown ideal weight.). No results for input(s): VANCOTROUGH, VANCOPEAK, VANCORANDOM, GENTTROUGH, GENTPEAK, GENTRANDOM, TOBRATROUGH, TOBRAPEAK, TOBRARND, AMIKACINPEAK, AMIKACINTROU, AMIKACIN in the last 72 hours.   Microbiology: No results found for this or any previous visit (from the past 720 hour(s)).  Medical History: Past Medical History  Diagnosis Date  . Stroke   . Acute bronchitis   . Hypertension     Medications:  Scheduled:   Infusions:  . aztreonam    . sodium chloride    . vancomycin     PRN:   Assessment: 81 y.o. male [facility]  09[facility]11/2014 [facility] w/ new onset nausea/vomiting; recent UTI still completing treatment with Bactrim.  CXR reveals new RLL pneumonia.  To begin vancomycin per pharmacy.  Aztreonam x 1 in ED  Bactrim PTA 6/4 >> vancomycin >> 6/4 >> aztreonam x 1    Tmax: afebrile WBCs: wnl Renal: SCr 1.48; no weight since Dec 2015; est CrCl 26 ml/min  No Cx ordered yet  Goal of Therapy:  Vancomycin trough level 15-20 mcg/ml  Eradication of infection Appropriate antibiotic dosing for indication and renal function  Plan:  Day 1 antibiotics  Vancomycin 1 g IV x 1, then 750 mg IV q24 hr  Anticipate continued aztreonam orders  Follow clinical course, renal function, culture results as  available  Follow for de-escalation of antibiotics and LOT   Bernadene Personrew Magali Bray, PharmD Pager: [phone] 09/11/2014, 8:49 PM

## 2014-09-11 NOTE — H&P (Addendum)
PCP: Miles Costain  Referring provider  Heather   Chief Complaint:  Nausea/ vomiting/ diarrhea  HPI: Russell [name] is a 81 y.o. male   has a past medical history of Stroke; Acute bronchitis; and Hypertension.   She was brought in from [facility] assisted living secondary to nausea vomiting for the past 2 days for total but 3-4 episodes she was recently on Bactrim for urinary tract infection. Also apparently had some loose stools today. No blood in stool no melena. IN ER patient was noted to be coughing severely. Chest x-ray was done showing new right lower lobe airspace disease consistent pneumonia. Patient was stranded on aztreonam and vancomycin   Hospitalist was called for admission for HCAP  Review of Systems:    Pertinent positives include: nausea, vomiting, diarrhea,shortness of breath at rest  Constitutional:  No weight loss, night sweats, Fevers, chills, fatigue, weight loss  HEENT:  No headaches, Difficulty swallowing,Tooth/dental problems,Sore throat,  No sneezing, itching, ear ache, nasal congestion, post nasal drip,  Cardio-vascular:  No chest pain, Orthopnea, PND, anasarca, dizziness, palpitations.no Bilateral lower extremity swelling  GI:  No heartburn, indigestion, abdominal pain,  change in bowel habits, loss of appetite, melena, blood in stool, hematemesis Resp:  no . No dyspnea on exertion, No excess mucus, no productive cough, No non-productive cough, No coughing up of blood.No change in color of mucus.No wheezing. Skin:  no rash or lesions. No jaundice GU:  no dysuria, change in color of urine, no urgency or frequency. No straining to urinate.  No flank pain.  Musculoskeletal:  No joint pain or no joint swelling. No decreased range of motion. No back pain.  Psych:  No change in mood or affect. No depression or anxiety. No memory loss.  Neuro: no localizing neurological complaints, no tingling, no weakness, no double vision, no gait  abnormality, no slurred speech, no confusion  Otherwise ROS are negative except for above, 10 systems were reviewed  Past Medical History: Past Medical History  Diagnosis Date  . Stroke   . Acute bronchitis   . Hypertension    Past Surgical History  Procedure Laterality Date  . Hernia repair    . Lumbar disc surgery    . Hip arthroplasty Right 08/26/2012    Procedure: RIGHT HEMI HIP ARTHROPLASTY ;  Surgeon: Velna Ochs, MD;  Location: [facility];  Service: Orthopedics;  Laterality: Right;     Medications: Prior to Admission medications   Medication Sig Start Date End Date Taking? Authorizing Provider  acetaminophen (TYLENOL) 500 MG tablet Take 500 mg by mouth 3 (three) times daily.   Yes Historical Provider, MD  ALPRAZolam (XANAX) 0.25 MG tablet Take 1 tablet (0.25 mg total) by mouth 2 (two) times daily. Take one tablet by mouth every 6 hours as needed for anxiety 03/19/14  Yes Ripudeep K Rai, MD  capsaicin (ZOSTRIX) 0.025 % cream Apply 1 application topically 2 (two) times daily. Both Thumbs.   Yes Historical Provider, MD  divalproex (DEPAKOTE SPRINKLE) 125 MG capsule Take 250 mg by mouth every evening.   Yes Historical Provider, MD  Ensure Plus (ENSURE PLUS) LIQD Take 237 mLs by mouth 2 (two) times daily.    Yes Historical Provider, MD  finasteride (PROSCAR) 5 MG tablet Take 5 mg by mouth at bedtime.   Yes Historical Provider, MD  furosemide (LASIX) 20 MG tablet Take 10 mg by mouth daily.   Yes Historical Provider, MD  HYDROcodone-acetaminophen (NORCO) 5-325 MG per tablet Take  1 tablet by mouth every 4 (four) hours as needed for severe pain. 03/19/14  Yes Ripudeep Jenna Luo, MD  Multiple Vitamins-Iron (MULTIVITAMINS WITH IRON) TABS tablet Take 1 tablet by mouth every evening.    Yes Historical Provider, MD  ondansetron (ZOFRAN) 4 MG tablet Take 4 mg by mouth every 12 (twelve) hours as needed for nausea or vomiting.   Yes Historical Provider, MD  polyethylene glycol (MIRALAX / GLYCOLAX)  packet Take 17 g by mouth daily. 02/11/12  Yes Jessica U Vann, DO  senna-docusate (SENOKOT-S) 8.6-50 MG per tablet Take 1 tablet by mouth every evening.    Yes Historical Provider, MD  sertraline (ZOLOFT) 50 MG tablet Take 50 mg by mouth every evening.    Yes Historical Provider, MD  sulfamethoxazole-trimethoprim (BACTRIM,SEPTRA) 400-80 MG per tablet Take 1 tablet by mouth 2 (two) times daily. For 21 Days. 09/03/14  Yes Historical Provider, MD  terazosin (HYTRIN) 5 MG capsule Take 5 mg by mouth at bedtime.   Yes Historical Provider, MD  traMADol (ULTRAM) 50 MG tablet Take 1.5 tablets (75 mg total) by mouth 3 (three) times daily. 03/19/14  Yes Ripudeep Jenna Luo, MD    Allergies:   Allergies  Allergen Reactions  . Ambien [Zolpidem Tartrate] Other (See Comments)    Unknown reaction  . Penicillins Other (See Comments)    Unknown reaction    Social History:   From facility [facility]   reports that he has never smoked. He has never used smokeless tobacco. He reports that he does not drink alcohol or use illicit drugs.    Family History: family history includes CVA in his mother.    Physical Exam: Patient Vitals for the past 24 hrs:  BP Temp Temp src Pulse Resp SpO2  09/11/14 2057 136/58 mmHg 99 F (37.2 C) Oral 68 22 95 %  09/11/14 1900 144/61 mmHg - - 67 - 99 %  09/11/14 1844 - - - - - 94 %  09/11/14 1842 134/59 mmHg 98.6 F (37 C) Oral 70 16 91 %    1. General:  in No Acute distress 2. Psychological: Alert not Oriented 3. Head/ENT:    Dry Mucous Membranes                          Head Non traumatic, neck supple                           Poor Dentition 4. SKIN:  decreased Skin turgor,  Skin clean Dry and intact no rash 5. Heart: Regular rate and rhythm no Murmur, Rub or gallop 6. Lungs: no wheezes some crackles  right worse than left 7. Abdomen: Soft, non-tender, Non distended 8. Lower extremities: no clubbing, cyanosis, or edema 9. Neurologically Grossly intact,  moving all 4 extremities equally 10. MSK: Normal range of motion  body mass index is unknown because there is no weight on file.   Labs on Admission:   Results for orders placed or performed during the hospital encounter of 09/11/14 (from the past 24 hour(s))  CBC with Differential/Platelet     Status: Abnormal   Collection Time: 09/11/14  7:37 PM  Result Value Ref Range   WBC 6.8 4.0 - 10.5 K/uL   RBC 2.63 (L) 4.22 - 5.81 MIL/uL   Hemoglobin 8.4 (L) 13.0 - 17.0 g/dL   HCT 16.1 (L) 09.6 - 04.5 %   MCV 95.4 78.0 -  100.0 fL   MCH 31.9 26.0 - 34.0 pg   MCHC 33.5 30.0 - 36.0 g/dL   RDW 16.1 (H) 09.6 - 04.5 %   Platelets 242 150 - 400 K/uL   Neutrophils Relative % 80 (H) 43 - 77 %   Lymphocytes Relative 13 12 - 46 %   Monocytes Relative 1 (L) 3 - 12 %   Eosinophils Relative 6 (H) 0 - 5 %   Basophils Relative 0 0 - 1 %   Neutro Abs 5.4 1.7 - 7.7 K/uL   Lymphs Abs 0.9 0.7 - 4.0 K/uL   Monocytes Absolute 0.1 0.1 - 1.0 K/uL   Eosinophils Absolute 0.4 0.0 - 0.7 K/uL   Basophils Absolute 0.0 0.0 - 0.1 K/uL   WBC Morphology INCREASED BANDS (>20% BANDS)   Comprehensive metabolic panel     Status: Abnormal   Collection Time: 09/11/14  7:37 PM  Result Value Ref Range   Sodium 128 (L) 135 - 145 mmol/L   Potassium 3.8 3.5 - 5.1 mmol/L   Chloride 94 (L) 101 - 111 mmol/L   CO2 22 22 - 32 mmol/L   Glucose, Bld 207 (H) 65 - 99 mg/dL   BUN 23 (H) 6 - 20 mg/dL   Creatinine, Ser 4.09 (H) 0.61 - 1.24 mg/dL   Calcium 7.8 (L) 8.9 - 10.3 mg/dL   Total Protein 6.4 (L) 6.5 - 8.1 g/dL   Albumin 3.1 (L) 3.5 - 5.0 g/dL   AST 23 15 - 41 U/L   ALT 12 (L) 17 - 63 U/L   Alkaline Phosphatase 38 38 - 126 U/L   Total Bilirubin 0.5 0.3 - 1.2 mg/dL   GFR calc non Af Amer 38 (L) >60 mL/min   GFR calc Af Amer 45 (L) >60 mL/min   Anion gap 12 5 - 15  I-Stat CG4 Lactic Acid, ED     Status: None   Collection Time: 09/11/14  7:44 PM  Result Value Ref Range   Lactic Acid, Venous 1.44 0.5 - 2.0 mmol/L    Urinalysis, Routine w reflex microscopic (not at [facility])     Status: None   Collection Time: 09/11/14  8:30 PM  Result Value Ref Range   Color, Urine YELLOW YELLOW   APPearance CLEAR CLEAR   Specific Gravity, Urine 1.012 1.005 - 1.030   pH 7.0 5.0 - 8.0   Glucose, UA NEGATIVE NEGATIVE mg/dL   Hgb urine dipstick NEGATIVE NEGATIVE   Bilirubin Urine NEGATIVE NEGATIVE   Ketones, ur NEGATIVE NEGATIVE mg/dL   Protein, ur NEGATIVE NEGATIVE mg/dL   Urobilinogen, UA 1.0 0.0 - 1.0 mg/dL   Nitrite NEGATIVE NEGATIVE   Leukocytes, UA NEGATIVE NEGATIVE    UA no evidence of UTI  No results found for: HGBA1C  CrCl cannot be calculated (Unknown ideal weight.).  BNP (last 3 results) No results for input(s): PROBNP in the last 8760 hours.  Other results:  I have pearsonaly reviewed this: ECG REPORT not obtained There were no vitals filed for this visit.   Cultures:    Component Value Date/Time   SDES BLOOD RIGHT HAND 02/06/2012 1345   SPECREQUEST BOTTLES DRAWN AEROBIC ONLY 6CC 02/06/2012 1345   CULT NO GROWTH 5 DAYS 02/06/2012 1345   REPTSTATUS 02/12/2012 FINAL 02/06/2012 1345     Radiological Exams on Admission: Dg Chest 2 View  09/11/2014   CLINICAL DATA:  Hypoxia. Pneumonia. Vomiting. Urinary tract infection.  EXAM: CHEST  2 VIEW  COMPARISON:  09/11/2012  FINDINGS:  New airspace disease is seen in the right lower lobe, consistent with pneumonia. Left lung remains grossly clear. No evidence of pleural effusion. Mild cardiomegaly and ectasia thoracic aorta are stable.  IMPRESSION: New right lower lobe airspace disease, consistent with pneumonia. Followup PA and lateral chest X-ray is recommended in 3-4 weeks following trial of antibiotic therapy to ensure resolution and exclude underlying malignancy.  Stable mild cardiomegaly.   Electronically Signed   By: Myles RosenthalJohn  Stahl M.D.   On: 09/11/2014 19:52    Chart has been reviewed  Family not  at  Bedside    Assessment/Plan  A 81 year old  gentleman history of chronic kidney disease cardiomegaly residing in a nursing facility with history of significant dementia number provided to his own history here with nausea vomiting no stool was also found to have right lower lobe pneumonia admitted for treatment of HCAP  Present on Admission:  .HCAP (healthcare-associated pneumonia) - admit for IV antibodies evaluate for aspiration speech pathology consult  . Chronic kidney disease currently stable continue to monitor  . Nausea vomiting and diarrhea currently appears to have resolved. Treat conservatively given diarrhea and recent antibody Q's Will check for C. difficile  . Anemia check an anemia panel and Hemoccults stool   Prophylaxis: SCD given ongoing anemia check for Hemoccult-positive stool  CODE STATUS:   DNR/DNI as per paperwork  Disposition:  Back to current facility when stable    Other plan as per orders.  I have spent a total of 55 min on this admission  Russell [name] 09/11/2014, 11:34 PM  [facility]  Pager [phone]   after 2 AM please page floor coverage PA If 7AM-7PM, please contact the day team taking care of the patient  [email or web address]  Password [email or web address]

## 2014-09-11 NOTE — ED Notes (Signed)
Bed: [id number] Expected date: 09/11/14 Expected time: 5:55 PM Means of arrival: Ambulance Comments: UTI, vomiting

## 2014-09-11 NOTE — ED Notes (Signed)
[facility]/o vomiting that started 2 days ago, same time he was placed on antibiotics for UTI. Pt given 200 ml N?S and 4 mg of zofran en route

## 2014-09-12 DIAGNOSIS — N184 Chronic kidney disease, stage 4 (severe): Secondary | ICD-10-CM

## 2014-09-12 DIAGNOSIS — D500 Iron deficiency anemia secondary to blood loss (chronic): Secondary | ICD-10-CM

## 2014-09-12 DIAGNOSIS — R112 Nausea with vomiting, unspecified: Secondary | ICD-10-CM

## 2014-09-12 DIAGNOSIS — R197 Diarrhea, unspecified: Secondary | ICD-10-CM

## 2014-09-12 DIAGNOSIS — J189 Pneumonia, unspecified organism: Secondary | ICD-10-CM

## 2014-09-12 LAB — COMPREHENSIVE METABOLIC PANEL
ALBUMIN: 3 g/dL — AB (ref 3.5–5.0)
ALT: 14 U/L — AB (ref 17–63)
AST: 27 U/L (ref 15–41)
Alkaline Phosphatase: 35 U/L — ABNORMAL LOW (ref 38–126)
Anion gap: 12 (ref 5–15)
BUN: 28 mg/dL — ABNORMAL HIGH (ref 6–20)
CALCIUM: 7.6 mg/dL — AB (ref 8.9–10.3)
CO2: 22 mmol/L (ref 22–32)
CREATININE: 1.67 mg/dL — AB (ref 0.61–1.24)
Chloride: 96 mmol/L — ABNORMAL LOW (ref 101–111)
GFR calc Af Amer: 38 mL/min — ABNORMAL LOW (ref >60)
GFR calc non Af Amer: 33 mL/min — ABNORMAL LOW (ref >60)
Glucose, Bld: 155 mg/dL — ABNORMAL HIGH (ref 65–99)
POTASSIUM: 3.4 mmol/L — AB (ref 3.5–5.1)
SODIUM: 130 mmol/L — AB (ref 135–145)
Total Bilirubin: <0.1 mg/dL — ABNORMAL LOW (ref 0.3–1.2)
Total Protein: 6.3 g/dL — ABNORMAL LOW (ref 6.5–8.1)

## 2014-09-12 LAB — CBC WITH DIFFERENTIAL/PLATELET
BASOS ABS: 0 10*3/uL (ref 0.0–0.1)
Basophils Relative: 0 % (ref 0–1)
Eosinophils Absolute: 0.1 10*3/uL (ref 0.0–0.7)
Eosinophils Relative: 1 % (ref 0–5)
HCT: 25.2 % — ABNORMAL LOW (ref 39.0–52.0)
Hemoglobin: 8.5 g/dL — ABNORMAL LOW (ref 13.0–17.0)
Lymphocytes Relative: 7 % — ABNORMAL LOW (ref 12–46)
Lymphs Abs: 1 10*3/uL (ref 0.7–4.0)
MCH: 32.2 pg (ref 26.0–34.0)
MCHC: 33.7 g/dL (ref 30.0–36.0)
MCV: 95.5 fL (ref 78.0–100.0)
Monocytes Absolute: 0.7 10*3/uL (ref 0.1–1.0)
Monocytes Relative: 5 % (ref 3–12)
Neutro Abs: 12.8 10*3/uL — ABNORMAL HIGH (ref 1.7–7.7)
Neutrophils Relative %: 87 % — ABNORMAL HIGH (ref 43–77)
PLATELETS: 234 10*3/uL (ref 150–400)
RBC: 2.64 MIL/uL — AB (ref 4.22–5.81)
RDW: 16.3 % — AB (ref 11.5–15.5)
WBC Morphology: INCREASED
WBC: 14.6 10*3/uL — ABNORMAL HIGH (ref 4.0–10.5)

## 2014-09-12 LAB — RETICULOCYTES
RBC.: 2.64 MIL/uL — ABNORMAL LOW (ref 4.22–5.81)
Retic Count, Absolute: 18.5 10*3/uL — ABNORMAL LOW (ref 19.0–186.0)
Retic Ct Pct: 0.7 % (ref 0.4–3.1)

## 2014-09-12 LAB — STREP PNEUMONIAE URINARY ANTIGEN: Strep Pneumo Urinary Antigen: NEGATIVE

## 2014-09-12 LAB — URIC ACID: Uric Acid, Serum: 4.8 mg/dL (ref 4.4–7.6)

## 2014-09-12 LAB — IRON AND TIBC
Iron: 10 ug/dL — ABNORMAL LOW (ref 45–182)
Saturation Ratios: 5 % — ABNORMAL LOW (ref 17.9–39.5)
TIBC: 186 ug/dL — ABNORMAL LOW (ref 250–450)
UIBC: 176 ug/dL

## 2014-09-12 LAB — CLOSTRIDIUM DIFFICILE BY PCR: Toxigenic C. Difficile by PCR: NEGATIVE

## 2014-09-12 LAB — OSMOLALITY, URINE: Osmolality, Ur: 495 mOsm/kg (ref 390–1090)

## 2014-09-12 LAB — URIC ACID, RANDOM URINE: URIC ACID, URINE: 41 mg/dL

## 2014-09-12 LAB — CREATININE, URINE, RANDOM: Creatinine, Urine: 119.41 mg/dL

## 2014-09-12 LAB — FOLATE: FOLATE: 27.1 ng/mL (ref >5.9)

## 2014-09-12 LAB — SODIUM, URINE, RANDOM: Sodium, Ur: 55 mmol/L

## 2014-09-12 LAB — VITAMIN B12: Vitamin B-12: 555 pg/mL (ref 180–914)

## 2014-09-12 LAB — FERRITIN: Ferritin: 1722 ng/mL — ABNORMAL HIGH (ref 24–336)

## 2014-09-12 LAB — MRSA PCR SCREENING: MRSA BY PCR: NEGATIVE

## 2014-09-12 MED ORDER — ACETAMINOPHEN 10 MG/ML IV SOLN
1000.0000 mg | Freq: Four times a day (QID) | INTRAVENOUS | Status: AC
  Administered 2014-09-12 (×4): 1000 mg via INTRAVENOUS
  Filled 2014-09-12 (×5): qty 100

## 2014-09-12 MED ORDER — TERAZOSIN HCL 5 MG PO CAPS
5.0000 mg | ORAL_CAPSULE | Freq: Every day | ORAL | Status: DC
  Filled 2014-09-12 (×3): qty 1

## 2014-09-12 MED ORDER — FINASTERIDE 5 MG PO TABS
5.0000 mg | ORAL_TABLET | Freq: Every day | ORAL | Status: DC
  Filled 2014-09-12 (×3): qty 1

## 2014-09-12 MED ORDER — SODIUM CHLORIDE 0.9 % IV SOLN
INTRAVENOUS | Status: DC
  Administered 2014-09-12: 04:00:00 via INTRAVENOUS

## 2014-09-12 MED ORDER — FUROSEMIDE 20 MG PO TABS
10.0000 mg | ORAL_TABLET | Freq: Every day | ORAL | Status: DC
  Administered 2014-09-12: 10 mg via ORAL
  Filled 2014-09-12 (×2): qty 0.5

## 2014-09-12 MED ORDER — TRAMADOL HCL 50 MG PO TABS
75.0000 mg | ORAL_TABLET | Freq: Three times a day (TID) | ORAL | Status: DC
  Administered 2014-09-12 – 2014-09-14 (×3): 75 mg via ORAL
  Filled 2014-09-12 (×4): qty 2

## 2014-09-12 MED ORDER — VALPROATE SODIUM 500 MG/5ML IV SOLN
250.0000 mg | Freq: Every day | INTRAVENOUS | Status: DC
  Administered 2014-09-12 – 2014-09-14 (×3): 250 mg via INTRAVENOUS
  Filled 2014-09-12 (×3): qty 2.5

## 2014-09-12 MED ORDER — HYDROCODONE-ACETAMINOPHEN 5-325 MG PO TABS: 1.0000 | ORAL_TABLET | ORAL | Status: DC | PRN

## 2014-09-12 MED ORDER — DEXTROSE 5 % IV SOLN
1.0000 g | Freq: Three times a day (TID) | INTRAVENOUS | Status: DC
  Administered 2014-09-12 – 2014-09-13 (×4): 1 g via INTRAVENOUS
  Filled 2014-09-12 (×5): qty 1

## 2014-09-12 MED ORDER — HALOPERIDOL LACTATE 5 MG/ML IJ SOLN
2.0000 mg | Freq: Four times a day (QID) | INTRAMUSCULAR | Status: DC | PRN
  Administered 2014-09-13 – 2014-09-14 (×3): 2 mg via INTRAVENOUS
  Filled 2014-09-12 (×3): qty 1

## 2014-09-12 MED ORDER — SERTRALINE HCL 50 MG PO TABS
50.0000 mg | ORAL_TABLET | Freq: Every evening | ORAL | Status: DC
  Administered 2014-09-13: 50 mg via ORAL
  Filled 2014-09-12 (×3): qty 1

## 2014-09-12 MED ORDER — DIVALPROEX SODIUM 125 MG PO CSDR
250.0000 mg | DELAYED_RELEASE_CAPSULE | Freq: Every evening | ORAL | Status: DC
  Filled 2014-09-12: qty 2

## 2014-09-12 MED ORDER — POTASSIUM CHLORIDE IN NACL 40-0.9 MEQ/L-% IV SOLN
INTRAVENOUS | Status: AC
  Administered 2014-09-12 – 2014-09-13 (×2): 75 mL/h via INTRAVENOUS
  Filled 2014-09-12 (×3): qty 1000

## 2014-09-12 MED ORDER — ACETAMINOPHEN 325 MG PO TABS: 650.0000 mg | ORAL_TABLET | Freq: Four times a day (QID) | ORAL | Status: DC | PRN

## 2014-09-12 MED ORDER — SODIUM CHLORIDE 0.9 % IV SOLN
510.0000 mg | Freq: Once | INTRAVENOUS | Status: AC
  Administered 2014-09-12: 510 mg via INTRAVENOUS
  Filled 2014-09-12: qty 17

## 2014-09-12 MED ORDER — ALPRAZOLAM 0.25 MG PO TABS
0.2500 mg | ORAL_TABLET | Freq: Two times a day (BID) | ORAL | Status: DC
  Administered 2014-09-12: 0.25 mg via ORAL
  Filled 2014-09-12: qty 1

## 2014-09-12 NOTE — Progress Notes (Signed)
Rx Brief Antibiotic note:  Azactam  See 6/4 note by Tamala Bari Wofford for full details  Plan:  Azactam 2GM x1 ED then 1Gm IV q8h  F/u Scr/cultures as needed  Lorenza EvangelistGreen, Kamariah Fruchter R 09/12/2014 3:43 AM

## 2014-09-12 NOTE — Progress Notes (Addendum)
Patient Demographics  Russell [name], is a 81 y.o. male, DOB - 10/06/1916, WUJ:[id number]  Admit date - 09/11/2014   Admitting Physician Therisa Doyne, MD  Outpatient Primary MD for the patient is Pcp Not In System  LOS - 1   Chief Complaint  Patient presents with  . Nausea  . Emesis        Subjective:   Russell [name] today in hospital bed, appears confused. Denies any headache chest or abdominal pain. Moving all 4 extremity is myself.  Assessment & Plan    1.HCAP due to Asp PNA - due to nausea vomiting, continue empiric anti-biotics as ordered, follow cultures, speech to evaluate. Nothing by mouth except medications. Supportive care with oxygen and nebulizer treatments as needed.   2. Dementia is at risk for delirium. Supportive care. Minimize narcotics and benzodiazepine's. Haldol as needed.   3. Iron deficiency anemia. Is on oral iron, still iron deficient we will dose IV iron and give 1 dose while here. No need for blood transfusion. Heme positive stool. Not a candidate for chemoradiation of surgery. Outpatient follow with PCP for age-appropriate iron deficiency anemia with heme positive stool workup.   4.BPH - continue alpha blocker.   5. Mild ARF on CKD 3 - baseline creatinine around 1.4, hydrate and monitor. Urine electrolytes ordered.   6. Hypokalemia. Replace.   7. Hyponatremia. Likely due to dehydration, IV fluids and monitor. Urine electrolytes ordered.     Code Status: DNR  Family Communication: Son Ethelene Browns with the phone, he agrees with DO NOT RESUSCITATE. Gen. medical treatment. If it climbs full foot care.  Disposition Plan: ALF vs SNF   Consults  Speech, PT   Procedures    DVT Prophylaxis SCDs  Lab Results  Component Value Date   PLT 234  09/12/2014    Medications  Scheduled Meds: . acetaminophen  1,000 mg Intravenous 4 times per day  . ALPRAZolam  0.25 mg Oral BID  . aztreonam  1 g Intravenous 3 times per day  . divalproex  250 mg Oral QPM  . finasteride  5 mg Oral QHS  . furosemide  10 mg Oral Daily  . sertraline  50 mg Oral QPM  . terazosin  5 mg Oral QHS  . traMADol  75 mg Oral TID  . vancomycin  750 mg Intravenous Q24H   Continuous Infusions: . 0.9 % NaCl with KCl 40 mEq / L 75 mL/hr (09/12/14 1015)   PRN Meds:.HYDROcodone-acetaminophen  Antibiotics     Anti-infectives    Start     Dose/Rate Route Frequency Ordered Stop   09/12/14 2000  vancomycin (VANCOCIN) IVPB 750 mg/150 ml premix     750 mg 150 mL/hr over 60 Minutes Intravenous Every 24 hours 09/11/14 2058 09/19/14 1959   09/12/14 0600  aztreonam (AZACTAM) 1 g in dextrose 5 % 50 mL IVPB     1 g 100 mL/hr over 30 Minutes Intravenous 3 times per day 09/12/14 0341     09/11/14 2100  vancomycin (VANCOCIN) IVPB 1000 mg/200 mL premix     1,000 mg 200 mL/hr over 60 Minutes Intravenous  Once 09/11/14 2029 09/11/14 2255   09/11/14 2015  aztreonam (AZACTAM) 2 g in dextrose 5 % 50 mL IVPB  2 g 100 mL/hr over 30 Minutes Intravenous  Once 09/11/14 2012 09/11/14 2157        Objective:   Filed Vitals:   09/12/14 0146 09/12/14 0250 09/12/14 0535 09/12/14 1019  BP:  111/51 100/43 92/42  Pulse:  82 78 66  Temp:  103 F (39.4 C) 100.7 F (38.2 C) 97.4 F (36.3 C)  TempSrc:  Axillary Axillary Oral  Resp:  20 22 18   Height:  6\' 1"  (1.854 m)    Weight:  56.5 kg (124 lb 9 oz)    SpO2: 93% 93% 96% 96%    Wt Readings from Last 3 Encounters:  09/12/14 56.5 kg (124 lb 9 oz)  03/19/14 62.596 kg (138 lb)  09/12/12 69.945 kg (154 lb 3.2 oz)     Intake/Output Summary (Last 24 hours) at 09/12/14 1038 Last data filed at 09/12/14 0751  Gross per 24 hour  Intake    750 ml  Output    200 ml  Net    550 ml     Physical Exam  Awake , but pleasantly  confused, No new F.N deficits, Normal affect [place].AT,PERRAL Supple Neck,No JVD, No cervical lymphadenopathy appriciated.  Symmetrical Chest wall movement, Good air movement bilaterally, bilateral coarse breath sounds RRR,No Gallops,Rubs or new Murmurs, No Parasternal Heave +ve B.Sounds, Abd Soft, No tenderness, No organomegaly appriciated, No rebound - guarding or rigidity. No Cyanosis, Clubbing or edema, No new Rash or bruise      Data Review   Micro Results Recent Results (from the past 240 hour(s))  MRSA PCR Screening     Status: None   Collection Time: 09/12/14  3:17 AM  Result Value Ref Range Status   MRSA by PCR NEGATIVE NEGATIVE Final    Comment:        The GeneXpert MRSA Assay (FDA approved for NASAL specimens only), is one component of a comprehensive MRSA colonization surveillance program. It is not intended to diagnose MRSA infection nor to guide or monitor treatment for MRSA infections.     Radiology Reports Dg Chest 2 View  09/11/2014   CLINICAL DATA:  Hypoxia. Pneumonia. Vomiting. Urinary tract infection.  EXAM: CHEST  2 VIEW  COMPARISON:  09/11/2012  FINDINGS: New airspace disease is seen in the right lower lobe, consistent with pneumonia. Left lung remains grossly clear. No evidence of pleural effusion. Mild cardiomegaly and ectasia thoracic aorta are stable.  IMPRESSION: New right lower lobe airspace disease, consistent with pneumonia. Followup PA and lateral chest X-ray is recommended in 3-4 weeks following trial of antibiotic therapy to ensure resolution and exclude underlying malignancy.  Stable mild cardiomegaly.   Electronically Signed   By: Myles RosenthalJohn  Stahl M.D.   On: 09/11/2014 19:52     CBC  Recent Labs Lab 09/11/14 1937 09/12/14 0550  WBC 6.8 14.6*  HGB 8.4* 8.5*  HCT 25.1* 25.2*  PLT 242 234  MCV 95.4 95.5  MCH 31.9 32.2  MCHC 33.5 33.7  RDW 16.5* 16.3*  LYMPHSABS 0.9 1.0  MONOABS 0.1 0.7  EOSABS 0.4 0.1  BASOSABS 0.0 0.0    Chemistries    Recent Labs Lab 09/11/14 1937 09/12/14 0550  NA 128* 130*  K 3.8 3.4*  CL 94* 96*  CO2 22 22  GLUCOSE 207* 155*  BUN 23* 28*  CREATININE 1.48* 1.67*  CALCIUM 7.8* 7.6*  AST 23 27  ALT 12* 14*  ALKPHOS 38 35*  BILITOT 0.5 <0.1*   ------------------------------------------------------------------------------------------------------------------ estimated creatinine clearance is 21.1 mL/min (by  C-G formula based on Cr of 1.67). ------------------------------------------------------------------------------------------------------------------ No results for input(s): HGBA1C in the last 72 hours. ------------------------------------------------------------------------------------------------------------------ No results for input(s): CHOL, HDL, LDLCALC, TRIG, CHOLHDL, LDLDIRECT in the last 72 hours. ------------------------------------------------------------------------------------------------------------------ No results for input(s): TSH, T4TOTAL, T3FREE, THYROIDAB in the last 72 hours.  Invalid input(s): FREET3 ------------------------------------------------------------------------------------------------------------------  Recent Labs  09/12/14 0550  VITAMINB12 555  FOLATE 27.1  FERRITIN 1722*  TIBC 186*  IRON 10*  RETICCTPCT 0.7    Coagulation profile No results for input(s): INR, PROTIME in the last 168 hours.  No results for input(s): DDIMER in the last 72 hours.  Cardiac Enzymes No results for input(s): CKMB, TROPONINI, MYOGLOBIN in the last 168 hours.  Invalid input(s): CK ------------------------------------------------------------------------------------------------------------------ Invalid input(s): POCBNP   Time Spent in minutes  35   SINGH,PRASHANT K M.D on 09/12/2014 at 10:38 AM  Between 7am to 7pm - Pager - [phone]  After 7pm go to [email or web address] - password [email or web address]  [facility]   Office  [phone]

## 2014-09-12 NOTE — Progress Notes (Signed)
RT NT suctioned pt in left nostril 2 times. RT obtained a small amount of clear/white thin secretions. Pt tolerating procedure well and there was no bleeding post suctioning. Nasal trumpet was used to facilitate deep suctioning. Pt was placed back on 55% Venturi mask post procedure SpO2-94%, HR-76, RR-22. RT will continue to monitor as needed. RN notified.

## 2014-09-12 NOTE — Evaluation (Signed)
Clinical/Bedside Swallow Evaluation Patient Details  Name: Russell [name] Georgiou MRN: [id number] Date of Birth: 1916-04-17  Today's Date: 09/12/2014 Time: SLP Start Time (ACUTE ONLY): 1200 SLP Stop Time (ACUTE ONLY): 1222 SLP Time Calculation (min) (ACUTE ONLY): 22 min  Past Medical History:  Past Medical History  Diagnosis Date  . Stroke   . Acute bronchitis   . Hypertension    Past Surgical History:  Past Surgical History  Procedure Laterality Date  . Hernia repair    . Lumbar disc surgery    . Hip arthroplasty Right 08/26/2012    Procedure: RIGHT HEMI HIP ARTHROPLASTY ;  Surgeon: Velna OchsPeter G Dalldorf, MD;  Location: [facility];  Service: Orthopedics;  Laterality: Right;   HPI:  Russell [name] is [name] 81 y.o. male has [name] past medical history of Stroke; Acute bronchitis; and Hypertension. [facility] secondary to nausea and vomiting for the past 2 days for total but 3-4 episodes  He was recently on Bactrim for urinary tract infection. Also apparently he has had some loose stools today. In ER patient was noted to be coughing severely. Chest x-ray was done showing new right lower lobe airspace disease consistent pneumonia. Patient was started on aztreonam and vancomycin.  Nursing reported coughing after intake of liquids to take his medications this am.  Currently, per the nurse, all medications are being administered via IV.  Chart review does not show any previous ST work up.     Assessment / Plan / Recommendation Clinical Impression  Clinical swallow evaluation was completed.  The patient presents with oral/pharyngeal dysphagia that is currently compounded by his lethargy.  The patient required max cues/stimulation to maintain alertness level and required cues to remember to swallow.  The patient demonstrated [name] strong immediate cough given thins via teaspoon sips and he appeared to require multiple swallow to clear all presented material suggesting possible  pharyngeal residue.  Given this recommend keeping the patient strictly NPO.  ST to follow up tomorrow for possibility of an MBS depending upon the patient's alertness level.      Aspiration Risk  Severe    Diet Recommendation NPO   Medication Administration: Via alternative means    Other  Recommendations Oral Care Recommendations: Oral care QID   Follow Up Recommendations    ST f/u for possible MBS tomorrow.   Swallow Study Prior Functional Status   Not known    General Date of Onset: 09/11/14 Other Pertinent Information: Russell [name] Plair is [name] 81 y.o. male has [name] past medical history of Stroke; Acute bronchitis; and Hypertension. [facility] secondary to nausea and vomiting for the past 2 days for total but 3-4 episodes  He was recently on Bactrim for urinary tract infection. Also apparently he has had some loose stools today. In ER patient was noted to be coughing severely. Chest x-ray was done showing new right lower lobe airspace disease consistent pneumonia. Patient was started on aztreonam and vancomycin.  Nursing reported coughing after intake of liquids to take his medications this am.  Currently, per the nurse, all medications are being administered via IV.  Chart review does not show any previous ST work up.   Type of Study: Bedside swallow evaluation Previous Swallow Assessment: None noted. Diet Prior to this Study: NPO Temperature Spikes Noted: Yes History of Recent Intubation: No Behavior/Cognition: Cooperative;Lethargic/Drowsy;Requires cueing Oral Cavity - Dentition: Missing dentition Self-Feeding Abilities: Total assist Patient Positioning: Upright  in bed Baseline Vocal Quality: Low vocal intensity Volitional Cough: Weak Volitional Swallow: Able to elicit    Oral/Motor/Sensory Function Overall Oral Motor/Sensory Function: Appears within functional limits for tasks assessed Labial ROM: Within Functional Limits Labial Symmetry:  Within Functional Limits Labial Strength: Within Functional Limits Lingual ROM: Within Functional Limits Lingual Symmetry: Within Functional Limits Lingual Strength:  (unable to assess) Facial ROM: Within Functional Limits Facial Symmetry: Within Functional Limits Mandible: Within Functional Limits   Ice Chips Ice chips: Impaired Presentation: Spoon Oral Phase Impairments: Reduced labial seal;Reduced lingual movement/coordination Oral Phase Functional Implications: Prolonged oral transit Pharyngeal Phase Impairments: Suspected delayed Swallow;Decreased hyoid-laryngeal movement   Thin Liquid Thin Liquid: Impaired Presentation: Spoon Oral Phase Impairments: Reduced labial seal;Reduced lingual movement/coordination;Poor awareness of bolus Oral Phase Functional Implications: Prolonged oral transit Pharyngeal  Phase Impairments: Suspected delayed Swallow;Decreased hyoid-laryngeal movement;Multiple swallows;Cough - Immediate;Wet Vocal Quality    Nectar Thick Nectar Thick Liquid: Not tested   Honey Thick Honey Thick Liquid: Not tested   Puree Puree: Impaired Presentation: Spoon Oral Phase Impairments: Reduced labial seal;Reduced lingual movement/coordination;Poor awareness of bolus Oral Phase Functional Implications: Prolonged oral transit Pharyngeal Phase Impairments: Suspected delayed Swallow;Decreased hyoid-laryngeal movement;Multiple swallows   Solid   GO    Solid: Not tested       Fleet Contras 09/12/2014,12:36 PM  Dimas Aguas, MA, CCC-SLP Acute Rehab SLP [phone]

## 2014-09-13 ENCOUNTER — Inpatient Hospital Stay (HOSPITAL_COMMUNITY): Payer: Medicare Other

## 2014-09-13 DIAGNOSIS — N183 Chronic kidney disease, stage 3 (moderate): Secondary | ICD-10-CM

## 2014-09-13 LAB — BASIC METABOLIC PANEL
Anion gap: 10 (ref 5–15)
BUN: 37 mg/dL — AB (ref 6–20)
CO2: 22 mmol/L (ref 22–32)
CREATININE: 1.59 mg/dL — AB (ref 0.61–1.24)
Calcium: 7.5 mg/dL — ABNORMAL LOW (ref 8.9–10.3)
Chloride: 100 mmol/L — ABNORMAL LOW (ref 101–111)
GFR calc Af Amer: 41 mL/min — ABNORMAL LOW (ref >60)
GFR calc non Af Amer: 35 mL/min — ABNORMAL LOW (ref >60)
GLUCOSE: 88 mg/dL (ref 65–99)
POTASSIUM: 4.1 mmol/L (ref 3.5–5.1)
Sodium: 132 mmol/L — ABNORMAL LOW (ref 135–145)

## 2014-09-13 LAB — CBC
HEMATOCRIT: 25.7 % — AB (ref 39.0–52.0)
HEMOGLOBIN: 8.5 g/dL — AB (ref 13.0–17.0)
MCH: 31.1 pg (ref 26.0–34.0)
MCHC: 33.1 g/dL (ref 30.0–36.0)
MCV: 94.1 fL (ref 78.0–100.0)
Platelets: 220 10*3/uL (ref 150–400)
RBC: 2.73 MIL/uL — AB (ref 4.22–5.81)
RDW: 16.8 % — AB (ref 11.5–15.5)
WBC: 18.1 10*3/uL — ABNORMAL HIGH (ref 4.0–10.5)

## 2014-09-13 LAB — VANCOMYCIN, TROUGH: Vancomycin Tr: 10 ug/mL (ref 10.0–20.0)

## 2014-09-13 LAB — LEGIONELLA ANTIGEN, URINE

## 2014-09-13 LAB — OSMOLALITY: OSMOLALITY: 279 mosm/kg (ref 275–300)

## 2014-09-13 MED ORDER — SALINE SPRAY 0.65 % NA SOLN
1.0000 | NASAL | Status: DC | PRN
Start: 2014-09-13 — End: 2014-09-14
  Filled 2014-09-13: qty 44

## 2014-09-13 MED ORDER — POTASSIUM CHLORIDE IN NACL 40-0.9 MEQ/L-% IV SOLN
INTRAVENOUS | Status: DC
  Administered 2014-09-13: 50 mL/h via INTRAVENOUS
  Filled 2014-09-13 (×3): qty 1000

## 2014-09-13 MED ORDER — SODIUM CHLORIDE 0.9 % IV SOLN
1.0000 g | Freq: Once | INTRAVENOUS | Status: AC
  Administered 2014-09-13: 1 g via INTRAVENOUS
  Filled 2014-09-13: qty 1

## 2014-09-13 MED ORDER — SODIUM CHLORIDE 0.9 % IV SOLN
500.0000 mg | Freq: Two times a day (BID) | INTRAVENOUS | Status: DC
  Administered 2014-09-14 (×2): 500 mg via INTRAVENOUS
  Filled 2014-09-13 (×2): qty 0.5

## 2014-09-13 MED ORDER — RESOURCE THICKENUP CLEAR PO POWD
ORAL | Status: DC | PRN
  Filled 2014-09-13: qty 125

## 2014-09-13 NOTE — Progress Notes (Signed)
Speech Language Pathology Treatment: Dysphagia  Patient Details Name: Russell [name] MRN: [id number] DOB: 1916-05-18 Today's Date: 09/13/2014 Time: [id number] SLP Time Calculation (min) (ACUTE ONLY): 33 min  Assessment / Plan / Recommendation Clinical Impression  Pt presents with improved mentation and subsequently improved swallow ability.  Concerns for possible aspiration of thin via cup/straw are ongoing c/b pt immediate cough post swallow.  Of note, pt with poor awareness to aspiration - causing SLP to suspect chronic issue - ? Since CVA?     Good tolerance of softer foods (slp placed dentures with adhesive) and nectar liquids, tsps thin also. Note pt with right lobe pna - ? Concerning for aspiration of po vs emesis?    Recommend start dys3/nectar diet *allow tsps thin and conduct MBS next date.  Educated pt, Charity fundraiserN, Psychologist, sport and exercisenurse tech to plan.      HPI Other Pertinent Information: Russell [name] is [name] 81 y.o. male has [name] past medical history of Stroke; Acute bronchitis; and Hypertension. [facility] secondary to nausea and vomiting for the past 2 days for total but 3-4 episodes  He was recently on Bactrim for urinary tract infection. Also apparently he has had some loose stools today. In ER patient was noted to be coughing severely. Chest x-ray was done showing new right lower lobe airspace disease consistent pneumonia. Patient was started on aztreonam and vancomycin.  Nursing reported coughing after intake of liquids to take his medications this am.  Currently, per the nurse, all medications are being administered via IV.  Chart review does not show any previous ST work up.     Pertinent Vitals Pain Assessment: No/denies pain Pain Score: 4  Pain Location: throat  Pain Intervention(s): Repositioned  SLP Plan  MBS (next date)    Recommendations Diet recommendations: Dysphagia 3 (mechanical soft);Nectar-thick liquid (tsps thin ok ) Liquids provided via:  Teaspoon;Cup;Straw Supervision: Patient able to self feed Compensations: Slow rate;Small sips/bites Postural Changes and/or Swallow Maneuvers: Seated upright 90 degrees;Upright 30-60 min after meal              Oral Care Recommendations: Oral care BID Follow up Recommendations: Other (comment) (tbd) Plan: MBS (next date)    GO     Donavan Burnetamara Chrisanna Mishra, MS [facility] SLP [phone]

## 2014-09-13 NOTE — Progress Notes (Signed)
ANTIBIOTIC CONSULT NOTE  Pharmacy Consult for Vancomycin Indication: HCAP, likely secondary to aspiration  Allergies  Allergen Reactions  . Ambien [Zolpidem Tartrate] Other (See Comments)    Unknown reaction  . Penicillins Other (See Comments)    Unknown reaction    Patient Measurements: Height: 6\' 1"  (185.4 cm) Weight: 124 lb 9 oz (56.5 kg) IBW/kg (Calculated) : 79.9  Vital Signs: Temp: 97.7 F (36.5 C) (06/06 0800) Temp Source: Oral (06/06 0800) BP: 129/53 mmHg (06/06 0800) Pulse Rate: 59 (06/06 0800) Intake/Output from previous day: 06/05 0701 - 06/06 0700 In: 760.8 [P.O.:10; I.V.:481.3; IV Piggyback:269.5] Out: -  Intake/Output from this shift:    Labs:  Recent Labs  09/11/14 1937 09/12/14 0550 09/12/14 0811 09/13/14 0510  WBC 6.8 14.6*  --  18.1*  HGB 8.4* 8.5*  --  8.5*  PLT 242 234  --  220  LABCREA  --   --  119.41  --   CREATININE 1.48* 1.67*  --  1.59*   Estimated Creatinine Clearance: 22.2 mL/min (by C-G formula based on Cr of 1.59). No results for input(s): VANCOTROUGH, VANCOPEAK, VANCORANDOM, GENTTROUGH, GENTPEAK, GENTRANDOM, TOBRATROUGH, TOBRAPEAK, TOBRARND, AMIKACINPEAK, AMIKACINTROU, AMIKACIN in the last 72 hours.   Microbiology: 6/5 Blood x2: ngtd 6/5 CDiff PCR: negative 6/5 Stool: collected 6/5 MRSA PCR screen: negative X/X: Sputum culture ordered but not yet collected.  Antimicrobials: 6/4 >> vancomycin >> 6/4 >> aztreonam >>   Assessment: 81 y.o. male [facility]  [facility] w/ new onset nausea/vomiting; recent UTI still completing treatment with Bactrim.  CXR revealed new RLL pneumonia.  Began vancomycin and aztreonam with pharmacy dosing assistance requested.   H/O CKD noted.  Today, 09/13/2014: D3 Vancomycin 1 gram x 1, then 750 mg q24h D3 Aztreonam 1 gram q8h  Fever resolved Leukocytosis persists and is worsening Blood culture pending  SCr elevated, remains just above reported baseline of 1.4  Failed  bedside swallowing study yesterday; extremely high aspiration risk.  For possible MBS when more alert.  Goal of Therapy:  Vancomycin trough level 15-20 mcg/ml  Eradication of infection Appropriate antibiotic dosing for indication and renal function  Plan:  1. Continue aztreonam 1 gram IV q8h 2. Check vancomycin trough tonight at 1900 -Pre-steady-state level (drawing before 2nd maintenance dose); checking to rule out early excessive accumulation of vancomycin in setting of CKD and advanced age. 3. Follow renal function, culture results, clinical course.  Elie Goodyandy Eavan Gonterman, PharmD, BCPS Pager: [phone] 09/13/2014  10:32 AM

## 2014-09-13 NOTE — Progress Notes (Signed)
Brief Pharmacy note: Vancomycin  Day 3 Vancomycin 1gm, then 750mg  q24hr  Vanc trough tonight prior to 2nd maintenance dose (not yet at steady state) = 10  No apparent accumulation noted, continue Vanc 750mg  q24  Aztreonam discontinued earlier, Meropenem started for better anaerobic coverage  Thank you,   Otho BellowsGreen, Karson Reede L PharmD Pager [phone] 09/13/2014, 8:43 PM

## 2014-09-13 NOTE — Progress Notes (Signed)
Patient Demographics  Russell [name], is a 81 y.o. male, DOB - 24-Nov-1916, ZOX:[id number]  Admit date - 09/11/2014   Admitting Physician Therisa Doyne, MD  Outpatient Primary MD for the patient is Pcp Not In System  LOS - 2   Chief Complaint  Patient presents with  . Nausea  . Emesis        Subjective:   Russell [name] today in hospital bed, appears less confused. Denies any headache chest or abdominal pain. Moving all 4 extremity is myself.  Assessment & Plan    1.HCAP due to Asp PNA - due to nausea vomiting, continue empiric anti-biotics as ordered, follow cultures, speech to evaluate. Nothing by mouth except medications. Supportive care with oxygen and nebulizer treatments as needed. Leukocytosis worse, switch from aztreonam to meropenem for better anaerobic and pseudomonal coverage. Repeat chest x-ray and CBC.   2. Dementia is at risk for delirium. Supportive care. Minimize narcotics and benzodiazepine's. Haldol as needed.   3. Iron deficiency anemia. Is on oral iron, still iron deficient we will dose IV iron and give 1 dose while here. No need for blood transfusion. Heme positive stool. Not a candidate for chemoradiation of surgery. Outpatient follow with PCP for age-appropriate iron deficiency anemia with heme positive stool workup.   4.BPH - continue alpha blocker.   5. Mild ARF on CKD 3 - baseline creatinine around 1.4, hydrate and monitor. FeNa, 1 continue hydration, improving.   6. Hypokalemia. Replaced.   7. Hyponatremia. Due to dehydration, improving with IV fluids which will be continued.     Code Status: DNR  Family Communication: Son Ethelene Browns with the phone, he agrees with DO NOT RESUSCITATE. Gen. medical treatment. If it climbs full foot care.  Disposition Plan:  ALF vs SNF   Consults  Speech, PT   Procedures    DVT Prophylaxis SCDs  Lab Results  Component Value Date   PLT 220 09/13/2014    Medications  Scheduled Meds: . finasteride  5 mg Oral QHS  . sertraline  50 mg Oral QPM  . terazosin  5 mg Oral QHS  . traMADol  75 mg Oral TID  . valproate sodium  250 mg Intravenous Daily  . vancomycin  750 mg Intravenous Q24H   Continuous Infusions: . 0.9 % NaCl with KCl 40 mEq / L     PRN Meds:.haloperidol lactate, HYDROcodone-acetaminophen  Antibiotics     Anti-infectives    Start     Dose/Rate Route Frequency Ordered Stop   09/12/14 2000  vancomycin (VANCOCIN) IVPB 750 mg/150 ml premix     750 mg 150 mL/hr over 60 Minutes Intravenous Every 24 hours 09/11/14 2058 09/19/14 1959   09/12/14 0600  aztreonam (AZACTAM) 1 g in dextrose 5 % 50 mL IVPB  Status:  Discontinued     1 g 100 mL/hr over 30 Minutes Intravenous 3 times per day 09/12/14 0341 09/13/14 1059   09/11/14 2100  vancomycin (VANCOCIN) IVPB 1000 mg/200 mL premix     1,000 mg 200 mL/hr over 60 Minutes Intravenous  Once 09/11/14 2029 09/11/14 2255   09/11/14 2015  aztreonam (AZACTAM) 2 g in dextrose 5 % 50 mL IVPB     2 g 100 mL/hr over 30 Minutes Intravenous  Once 09/11/14 2012 09/11/14 2157        Objective:   Filed Vitals:   09/12/14 1810 09/12/14 2148 09/13/14 0152 09/13/14 0800  BP: 126/51 102/55 104/49 129/53  Pulse: 78 51 54 59  Temp: 98.9 F (37.2 C)  98.4 F (36.9 C) 97.7 F (36.5 C)  TempSrc: Oral Oral Oral Oral  Resp: Height:      Weight:      SpO2: 96% 99% 98% 99%    Wt Readings from Last 3 Encounters:  09/12/14 56.5 kg (124 lb 9 oz)  03/19/14 62.596 kg (138 lb)  09/12/12 69.945 kg (154 lb 3.2 oz)     Intake/Output Summary (Last 24 hours) at 09/13/14 1100 Last data filed at 09/12/14 1730  Gross per 24 hour  Intake 760.75 ml  Output      0 ml  Net 760.75 ml     Physical Exam  Awake , he is less confused today, able to  follow a few basic commands and answer a few questions, No new F.N deficits, Normal affect Brenas.AT,PERRAL Supple Neck,No JVD, No cervical lymphadenopathy appriciated.  Symmetrical Chest wall movement, Good air movement bilaterally, bilateral coarse breath sounds RRR,No Gallops,Rubs or new Murmurs, No Parasternal Heave +ve B.Sounds, Abd Soft, No tenderness, No organomegaly appriciated, No rebound - guarding or rigidity. No Cyanosis, Clubbing or edema, No new Rash or bruise      Data Review   Micro Results Recent Results (from the past 240 hour(s))  MRSA PCR Screening     Status: None   Collection Time: 09/12/14  3:17 AM  Result Value Ref Range Status   MRSA by PCR NEGATIVE NEGATIVE Final    Comment:        The GeneXpert MRSA Assay (FDA approved for NASAL specimens only), is one component of a comprehensive MRSA colonization surveillance program. It is not intended to diagnose MRSA infection nor to guide or monitor treatment for MRSA infections.   Clostridium Difficile by PCR     Status: None   Collection Time: 09/12/14  3:17 AM  Result Value Ref Range Status   C difficile by pcr NEGATIVE NEGATIVE Final    Comment: Performed at [facility]  Culture, blood (routine x 2)     Status: None (Preliminary result)   Collection Time: 09/12/14  5:50 AM  Result Value Ref Range Status   Specimen Description BLOOD RIGHT ARM  Final   Special Requests BOTTLES DRAWN AEROBIC AND ANAEROBIC 10 CC EACH  Final   Culture   Final           BLOOD CULTURE RECEIVED NO GROWTH TO DATE CULTURE WILL BE HELD FOR 5 DAYS BEFORE ISSUING A FINAL NEGATIVE REPORT Note: Culture results may be compromised due to an excessive volume of blood received in culture bottles. Performed at [facility]    Report Status PENDING  Incomplete  Culture, blood (routine x 2)     Status: None (Preliminary result)   Collection Time: 09/12/14  5:58 AM  Result Value Ref Range Status   Specimen Description BLOOD  RIGHT HAND  Final   Special Requests BOTTLES DRAWN AEROBIC AND ANAEROBIC 10 CC EACH  Final   Culture   Final           BLOOD CULTURE RECEIVED NO GROWTH TO DATE CULTURE WILL BE HELD FOR 5 DAYS BEFORE ISSUING A FINAL NEGATIVE REPORT Note: Culture results may be compromised due to an excessive volume  of blood received in culture bottles. Performed at [facility]    Report Status PENDING  Incomplete    Radiology Reports Dg Chest 2 View  09/11/2014   CLINICAL DATA:  Hypoxia. Pneumonia. Vomiting. Urinary tract infection.  EXAM: CHEST  2 VIEW  COMPARISON:  09/11/2012  FINDINGS: New airspace disease is seen in the right lower lobe, consistent with pneumonia. Left lung remains grossly clear. No evidence of pleural effusion. Mild cardiomegaly and ectasia thoracic aorta are stable.  IMPRESSION: New right lower lobe airspace disease, consistent with pneumonia. Followup PA and lateral chest X-ray is recommended in 3-4 weeks following trial of antibiotic therapy to ensure resolution and exclude underlying malignancy.  Stable mild cardiomegaly.   Electronically Signed   By: Myles RosenthalJohn  Stahl M.D.   On: 09/11/2014 19:52     CBC  Recent Labs Lab 09/11/14 1937 09/12/14 0550 09/13/14 0510  WBC 6.8 14.6* 18.1*  HGB 8.4* 8.5* 8.5*  HCT 25.1* 25.2* 25.7*  PLT 242 234 220  MCV 95.4 95.5 94.1  MCH 31.9 32.2 31.1  MCHC 33.5 33.7 33.1  RDW 16.5* 16.3* 16.8*  LYMPHSABS 0.9 1.0  --   MONOABS 0.1 0.7  --   EOSABS 0.4 0.1  --   BASOSABS 0.0 0.0  --     Chemistries   Recent Labs Lab 09/11/14 1937 09/12/14 0550 09/13/14 0510  NA 128* 130* 132*  K 3.8 3.4* 4.1  CL 94* 96* 100*  CO2 22 22 22   GLUCOSE 207* 155* 88  BUN 23* 28* 37*  CREATININE 1.48* 1.67* 1.59*  CALCIUM 7.8* 7.6* 7.5*  AST 23 27  --   ALT 12* 14*  --   ALKPHOS 38 35*  --   BILITOT 0.5 <0.1*  --    ------------------------------------------------------------------------------------------------------------------ estimated  creatinine clearance is 22.2 mL/min (by C-G formula based on Cr of 1.59). ------------------------------------------------------------------------------------------------------------------ No results for input(s): HGBA1C in the last 72 hours. ------------------------------------------------------------------------------------------------------------------ No results for input(s): CHOL, HDL, LDLCALC, TRIG, CHOLHDL, LDLDIRECT in the last 72 hours. ------------------------------------------------------------------------------------------------------------------ No results for input(s): TSH, T4TOTAL, T3FREE, THYROIDAB in the last 72 hours.  Invalid input(s): FREET3 ------------------------------------------------------------------------------------------------------------------  Recent Labs  09/12/14 0550  VITAMINB12 555  FOLATE 27.1  FERRITIN 1722*  TIBC 186*  IRON 10*  RETICCTPCT 0.7    Coagulation profile No results for input(s): INR, PROTIME in the last 168 hours.  No results for input(s): DDIMER in the last 72 hours.  Cardiac Enzymes No results for input(s): CKMB, TROPONINI, MYOGLOBIN in the last 168 hours.  Invalid input(s): CK ------------------------------------------------------------------------------------------------------------------ Invalid input(s): POCBNP   Time Spent in minutes  35   SINGH,PRASHANT K M.D on 09/13/2014 at 11:00 AM  Between 7am to 7pm - Pager - [phone]  After 7pm go to [email or web address] - password [email or web address]  [facility]   Office  [phone]

## 2014-09-13 NOTE — Evaluation (Signed)
Physical Therapy Evaluation Patient Details Name: Russell [name] MRN: [id number] DOB: Aug 21, 1916 Today's Date: 09/13/2014   History of Present Illness  81 yo male adm from [facility] for HCAP, N/V;  PMHx: R hip hemiarthroplasty, HTN  Clinical Impression  Patient evaluated by Physical Therapy with no further acute PT needs identified. All education has been completed and the patient has no further questions.  See below for any follow-up Physial Therapy or equipment needs. PT is signing off. Thank you for this referral.; pt does not amb at baseline, w/c level mobility; no further needs at this time O2 sats 98% on 3L RA sats 85%  Returned to >93% after O2 replaced at 3L     Follow Up Recommendations No PT follow up    Equipment Recommendations  None recommended by PT    Recommendations for Other Services       Precautions / Restrictions Precautions Precautions: Fall Restrictions Weight Bearing Restrictions: No      Mobility  Bed Mobility Overal bed mobility: Needs Assistance Bed Mobility: Supine to Sit     Supine to sit: Supervision     General bed mobility comments: for safety  Transfers Overall transfer level: Needs assistance Equipment used: Rolling walker (2 wheeled) Transfers: Lateral/Scoot Transfers          Lateral/Scoot Transfers: Supervision General transfer comment: for safety  Ambulation/Gait                Stairs            Wheelchair Mobility    Modified Rankin (Stroke Patients Only)       Balance Overall balance assessment: History of Falls Sitting-balance support: Feet supported;No upper extremity supported Sitting balance-Leahy Scale: Good                                       Pertinent Vitals/Pain Pain Assessment: 0-10 Pain Score: 4  Pain Location: throat  Pain Intervention(s): Repositioned    Home Living                   Additional Comments: resides at [facility]    Prior  Function Level of Independence: Needs assistance   Gait / Transfers Assistance Needed: pt reports I transfers and I w/c mobility   ADL's / Homemaking Assistance Needed: pt reports I         Hand Dominance        Extremity/Trunk Assessment   Upper Extremity Assessment: Overall WFL for tasks assessed           Lower Extremity Assessment: Generalized weakness         Communication   Communication: HOH  Cognition Arousal/Alertness: Awake/alert Behavior During Therapy: WFL for tasks assessed/performed Overall Cognitive Status: History of cognitive impairments - at baseline                      General Comments      Exercises        Assessment/Plan    PT Assessment Patent does not need any further PT services  PT Diagnosis Generalized weakness   PT Problem List    PT Treatment Interventions     PT Goals (Current goals can be found in the Care Plan section) Acute Rehab PT Goals Patient Stated Goal: to g back to BG PT Goal Formulation: Patient unable to participate in goal setting  Frequency     Barriers to discharge        Co-evaluation               End of Session   Activity Tolerance: Patient tolerated treatment well Patient left: in chair;with call bell/phone within reach;with chair alarm set           Time: [id number] PT Time Calculation (min) (ACUTE ONLY): 27 min   Charges:   PT Evaluation $Initial PT Evaluation Tier I: 1 Procedure PT Treatments $Therapeutic Activity: 8-22 mins   PT G Codes:        Joelle Flessner 09/13/2014, 11:07 AM

## 2014-09-13 NOTE — Progress Notes (Signed)
Agitated, confused.  Refusing Po meds, pulling at lines. Haldol 2mg  iv adm at 2100.Marland Kitchen. Resting quietly at present

## 2014-09-14 ENCOUNTER — Inpatient Hospital Stay (HOSPITAL_COMMUNITY): Payer: Medicare Other

## 2014-09-14 LAB — CBC
HCT: 26.7 % — ABNORMAL LOW (ref 39.0–52.0)
HEMOGLOBIN: 8.9 g/dL — AB (ref 13.0–17.0)
MCH: 32.1 pg (ref 26.0–34.0)
MCHC: 33.3 g/dL (ref 30.0–36.0)
MCV: 96.4 fL (ref 78.0–100.0)
Platelets: 246 10*3/uL (ref 150–400)
RBC: 2.77 MIL/uL — AB (ref 4.22–5.81)
RDW: 16.8 % — ABNORMAL HIGH (ref 11.5–15.5)
WBC: 9.4 10*3/uL (ref 4.0–10.5)

## 2014-09-14 LAB — BASIC METABOLIC PANEL
Anion gap: 6 (ref 5–15)
BUN: 31 mg/dL — ABNORMAL HIGH (ref 6–20)
CHLORIDE: 105 mmol/L (ref 101–111)
CO2: 22 mmol/L (ref 22–32)
Calcium: 7.6 mg/dL — ABNORMAL LOW (ref 8.9–10.3)
Creatinine, Ser: 1.25 mg/dL — ABNORMAL HIGH (ref 0.61–1.24)
GFR calc Af Amer: 55 mL/min — ABNORMAL LOW (ref >60)
GFR calc non Af Amer: 47 mL/min — ABNORMAL LOW (ref >60)
GLUCOSE: 91 mg/dL (ref 65–99)
POTASSIUM: 5 mmol/L (ref 3.5–5.1)
SODIUM: 133 mmol/L — AB (ref 135–145)

## 2014-09-14 LAB — MAGNESIUM: Magnesium: 1.9 mg/dL (ref 1.7–2.4)

## 2014-09-14 MED ORDER — TRAMADOL HCL 50 MG PO TABS: 75.0000 mg | ORAL_TABLET | Freq: Three times a day (TID) | ORAL | Status: DC

## 2014-09-14 MED ORDER — HYDROCODONE-ACETAMINOPHEN 5-325 MG PO TABS: 1.0000 | ORAL_TABLET | ORAL | Status: DC | PRN

## 2014-09-14 MED ORDER — LEVOFLOXACIN 500 MG PO TABS: 500.0000 mg | ORAL_TABLET | Freq: Every day | ORAL | Status: DC

## 2014-09-14 MED ORDER — ALPRAZOLAM 0.25 MG PO TABS: 0.2500 mg | ORAL_TABLET | Freq: Two times a day (BID) | ORAL | Status: DC

## 2014-09-14 MED ORDER — ENSURE ENLIVE PO LIQD
237.0000 mL | Freq: Two times a day (BID) | ORAL | Status: DC
  Administered 2014-09-14 (×2): 237 mL via ORAL

## 2014-09-14 MED ORDER — METRONIDAZOLE 500 MG PO TABS: 500.0000 mg | ORAL_TABLET | Freq: Three times a day (TID) | ORAL | Status: DC

## 2014-09-14 NOTE — Discharge Instructions (Signed)
Follow with Primary MD in 7 days   Get CBC, CMP, 2 view Chest X ray checked  by Primary MD next visit.    Activity: As tolerated with Full fall precautions use walker/cane & assistance as needed   Disposition ALF   For Heart failure patients - Check your Weight same time everyday, if you gain over 2 pounds, or you develop in leg swelling, experience more shortness of breath or chest pain, call your Primary MD immediately. Follow Cardiac Low Salt Diet and 1.5 lit/day fluid restriction.   On your next visit with your primary care physician please Get Medicines reviewed and adjusted.   Please request your Prim.MD to go over all Hospital Tests and Procedure/Radiological results at the follow up, please get all Hospital records sent to your Prim MD by signing hospital release before you go home.   If you experience worsening of your admission symptoms, develop shortness of breath, life threatening emergency, suicidal or homicidal thoughts you must seek medical attention immediately by calling 911 or calling your MD immediately  if symptoms less severe.  You Must read complete instructions/literature along with all the possible adverse reactions/side effects for all the Medicines you take and that have been prescribed to you. Take any new Medicines after you have completely understood and accpet all the possible adverse reactions/side effects.   Do not drive, operating heavy machinery, perform activities at heights, swimming or participation in water activities or provide baby sitting services if your were admitted for syncope or siezures until you have seen by Primary MD or a Neurologist and advised to do so again.  Do not drive when taking Pain medications.    Do not take more than prescribed Pain, Sleep and Anxiety Medications  Special Instructions: If you have smoked or chewed Tobacco  in the last 2 yrs please stop smoking, stop any regular Alcohol  and or any Recreational drug  use.  Wear Seat belts while driving.   Please note  You were cared for by a hospitalist during your hospital stay. If you have any questions about your discharge medications or the care you received while you were in the hospital after you are discharged, you can call the unit and asked to speak with the hospitalist on call if the hospitalist that took care of you is not available. Once you are discharged, your primary care physician will handle any further medical issues. Please note that NO REFILLS for any discharge medications will be authorized once you are discharged, as it is imperative that you return to your primary care physician (or establish a relationship with a primary care physician if you do not have one) for your aftercare needs so that they can reassess your need for medications and monitor your lab values.

## 2014-09-14 NOTE — Progress Notes (Signed)
CHL IP CLINICAL IMPRESSIONS 09/14/2014  Therapy Diagnosis Severe pharyngeal phase dysphagia;Other (Comment);Moderate pharyngeal phase dysphagia;Mild oral phase dysphagia  Clinical Impression Pt presents with mild oral and moderately severe pharyngeal dysphagia with sensorimotor components.  Weak oral manipulation noted resulting in delayed transiting and minimal oral residuals.   Pharyngeal swallow c/b decreased pharyngeal muscular motility resulting in gross residuals - mostly at vallecular region.  Pt does not sense residuals and dry or liquid swallows marginally effective to decrease them.  Aspiration of secretions mixing with barium noted without pt awareness.  Pt did not overtly aspirate nectar liquids but did with thin.  Chin tuck, head turn postures not helpful.  Pt able to expectorate small amount however residuals remained.     Upon esophageal sweep, pt appeared with slow clearance and retrograde propulsion in distal esophagus WITHOUT sensation, thin water faciliated clearance but likely resulted in aspiration c/b pt overt coughing immediate post swallow.   Based on pt report of problems managing secretions for 2=3 months, suspect pt has component of chronic dysphagia.  He reports his swallow ability today to be at baseline.    For this 81 yo pt with dementia, modification to diet with compensation strategies to decrease aspiration may be a feasible option.  If desire po with known risk, would recommend to continue dys3/nectar (allow thin water between meals for hydration) with strict precautions.  Using live video, educated pt to findings, recommendations.      No flowsheet data found.  CHL IP DIET RECOMMENDATION 09/14/2014  SLP Diet Recommendations Dysphagia 3 (Mech soft);Nectar;Free water protocol after oral care with known risks  Liquid Administration via Cup,straw  Medication Administration Crushed with puree  Compensations Slow rate;Small sips/bites;Multiple dry swallows after each  bite/sip;Follow solids with liquid        Russell Burnetamara Alianny Toelle, MS [facility] [phone]

## 2014-09-14 NOTE — Discharge Summary (Signed)
Russell [name], is a 81 y.o. male  DOB 07/22/1916  MRN [id number].  Admission date:  09/11/2014  Admitting Physician  Therisa Doyne, MD  Discharge Date:  09/14/2014   Primary MD  Pcp Not In System  Recommendations for primary care physician for things to follow:   CBC, CMP 2 view chest x-ray in a week  Outpatient age-appropriate iron deficiency anemia workup.   Admission Diagnosis  Hypoxic [R09.02] HCAP (healthcare-associated pneumonia) [J18.9] Non-intractable vomiting with nausea, vomiting of unspecified type [R11.2]   Discharge Diagnosis  Hypoxic [R09.02] HCAP (healthcare-associated pneumonia) [J18.9] Non-intractable vomiting with nausea, vomiting of unspecified type [R11.2]    Active Problems:   Chronic kidney disease   Anemia   HCAP (healthcare-associated pneumonia)   Nausea vomiting and diarrhea      Past Medical History  Diagnosis Date  . Stroke   . Acute bronchitis   . Hypertension     Past Surgical History  Procedure Laterality Date  . Hernia repair    . Lumbar disc surgery    . Hip arthroplasty Right 08/26/2012    Procedure: RIGHT HEMI HIP ARTHROPLASTY ;  Surgeon: Velna Ochs, MD;  Location: [facility];  Service: Orthopedics;  Laterality: Right;       History of present illness and  Hospital Course:     Kindly see H&P for history of present illness and admission details, please review complete Labs, Consult reports and Test reports for all details in brief  HPI  from the history and physical done on the day of admission   Russell [name] is a 81 y.o. male   has a past medical history of Stroke; Acute bronchitis; and Hypertension.   She was brought in from [facility] assisted living secondary to nausea vomiting for the past 2 days for total but 3-4 episodes she was  recently on Bactrim for urinary tract infection. Also apparently had some loose stools today. No blood in stool no melena. IN ER patient was noted to be coughing severely. Chest x-ray was done showing new right lower lobe airspace disease consistent pneumonia. Patient was stranded on aztreonam and vancomycin   Hospitalist was called for admission for [facility]  Hospital Course   1.HCAP due to Asp PNA - due to nausea vomiting, continue empiric anti-biotics as ordered, follow cultures, improved with empiric IV anti-biotics, speech evaluated the patient, underwent modified barium. Diet now is Dysphagia 3 (Mech soft);Nectar; Free water protocol after oral care with known risks, needs full feeding assistance and Asp precautions.  We'll place on 5 more days of oral Levaquin along with Flagyl, full aspiration precautions and feeding assistance. Request PCP to Repeat chest x-ray and CBC in 7-10 days.   2. Dementia is at risk for delirium. Supportive care. Minimize narcotics and benzodiazepine's. Haldol as needed.   3. Iron deficiency anemia. Is on oral iron, still iron deficient and got IV iron in the hospital, continue oral supplementation. Heme positive stool. Not a candidate for chemoradiation of surgery. Outpatient follow with PCP  for age-appropriate iron deficiency anemia with heme positive stool workup.   4.BPH - continue alpha blocker.   5. Mild ARF on CKD 3 - baseline creatinine around 1.4, hydrate and monitor. FeNa, 1 continue hydration, improving.   6. Hypokalemia. Replaced.   7. Hyponatremia. Due to dehydration, with hydration repeat CMP in a week.       Discharge Condition: Stable  Follow UP  Follow-up Information    Follow up with Follow with here primary care physician. Schedule an appointment as soon as possible for a visit in 1 week.   Why:  CBC, CMP and a 2 view chest x-ray check       Diet.  Dysphagia 3 (Mech soft);Nectar; Free water protocol after oral care with known  risks, needs full feeding assistance and Asp precautions.   Consults obtained - Speech  Activity recommendation: See Discharge Instructions below  Discharge Instructions       Discharge Instructions    Discharge instructions    Complete by:  As directed   Follow with Primary MD in 7 days   Get CBC, CMP, 2 view Chest X ray checked  by Primary MD next visit.    Activity: As tolerated with Full fall precautions use walker/cane & assistance as needed   Disposition ALF   For Heart failure patients - Check your Weight same time everyday, if you gain over 2 pounds, or you develop in leg swelling, experience more shortness of breath or chest pain, call your Primary MD immediately. Follow Cardiac Low Salt Diet and 1.5 lit/day fluid restriction.   On your next visit with your primary care physician please Get Medicines reviewed and adjusted.   Please request your Prim.MD to go over all Hospital Tests and Procedure/Radiological results at the follow up, please get all Hospital records sent to your Prim MD by signing hospital release before you go home.   If you experience worsening of your admission symptoms, develop shortness of breath, life threatening emergency, suicidal or homicidal thoughts you must seek medical attention immediately by calling 911 or calling your MD immediately  if symptoms less severe.  You Must read complete instructions/literature along with all the possible adverse reactions/side effects for all the Medicines you take and that have been prescribed to you. Take any new Medicines after you have completely understood and accpet all the possible adverse reactions/side effects.   Do not drive, operating heavy machinery, perform activities at heights, swimming or participation in water activities or provide baby sitting services if your were admitted for syncope or siezures until you have seen by Primary MD or a Neurologist and advised to do so again.  Do not drive  when taking Pain medications.    Do not take more than prescribed Pain, Sleep and Anxiety Medications  Special Instructions: If you have smoked or chewed Tobacco  in the last 2 yrs please stop smoking, stop any regular Alcohol  and or any Recreational drug use.  Wear Seat belts while driving.   Please note  You were cared for by a hospitalist during your hospital stay. If you have any questions about your discharge medications or the care you received while you were in the hospital after you are discharged, you can call the unit and asked to speak with the hospitalist on call if the hospitalist that took care of you is not available. Once you are discharged, your primary care physician will handle any further medical issues. Please note that NO REFILLS for any  discharge medications will be authorized once you are discharged, as it is imperative that you return to your primary care physician (or establish a relationship with a primary care physician if you do not have one) for your aftercare needs so that they can reassess your need for medications and monitor your lab values.     Increase activity slowly    Complete by:  As directed            Discharge Medications     Medication List    STOP taking these medications        sulfamethoxazole-trimethoprim 400-80 MG per tablet  Commonly known as:  BACTRIM,SEPTRA      TAKE these medications        acetaminophen 500 MG tablet  Commonly known as:  TYLENOL  Take 500 mg by mouth 3 (three) times daily.     ALPRAZolam 0.25 MG tablet  Commonly known as:  XANAX  Take 1 tablet (0.25 mg total) by mouth 2 (two) times daily. Take one tablet by mouth every 6 hours as needed for anxiety     capsaicin 0.025 % cream  Commonly known as:  ZOSTRIX  Apply 1 application topically 2 (two) times daily. Both Thumbs.     divalproex 125 MG capsule  Commonly known as:  DEPAKOTE SPRINKLE  Take 250 mg by mouth every evening.     ENSURE PLUS Liqd    Take 237 mLs by mouth 2 (two) times daily.     finasteride 5 MG tablet  Commonly known as:  PROSCAR  Take 5 mg by mouth at bedtime.     furosemide 20 MG tablet  Commonly known as:  LASIX  Take 10 mg by mouth daily.     HYDROcodone-acetaminophen 5-325 MG per tablet  Commonly known as:  NORCO  Take 1 tablet by mouth every 4 (four) hours as needed for severe pain.     levofloxacin 500 MG tablet  Commonly known as:  LEVAQUIN  Take 1 tablet (500 mg total) by mouth daily.     metroNIDAZOLE 500 MG tablet  Commonly known as:  FLAGYL  Take 1 tablet (500 mg total) by mouth 3 (three) times daily.     multivitamins with iron Tabs tablet  Take 1 tablet by mouth every evening.     ondansetron 4 MG tablet  Commonly known as:  ZOFRAN  Take 4 mg by mouth every 12 (twelve) hours as needed for nausea or vomiting.     polyethylene glycol packet  Commonly known as:  MIRALAX / GLYCOLAX  Take 17 g by mouth daily.     senna-docusate 8.6-50 MG per tablet  Commonly known as:  Senokot-S  Take 1 tablet by mouth every evening.     sertraline 50 MG tablet  Commonly known as:  ZOLOFT  Take 50 mg by mouth every evening.     terazosin 5 MG capsule  Commonly known as:  HYTRIN  Take 5 mg by mouth at bedtime.     traMADol 50 MG tablet  Commonly known as:  ULTRAM  Take 1.5 tablets (75 mg total) by mouth 3 (three) times daily.        Major procedures and Radiology Reports - PLEASE review detailed and final reports for all details, in brief -      Dg Chest 2 View  09/11/2014   CLINICAL DATA:  Hypoxia. Pneumonia. Vomiting. Urinary tract infection.  EXAM: CHEST  2 VIEW  COMPARISON:  09/11/2012  FINDINGS:  New airspace disease is seen in the right lower lobe, consistent with pneumonia. Left lung remains grossly clear. No evidence of pleural effusion. Mild cardiomegaly and ectasia thoracic aorta are stable.  IMPRESSION: New right lower lobe airspace disease, consistent with pneumonia. Followup PA and  lateral chest X-ray is recommended in 3-4 weeks following trial of antibiotic therapy to ensure resolution and exclude underlying malignancy.  Stable mild cardiomegaly.   Electronically Signed   By: Myles RosenthalJohn  Stahl M.D.   On: 09/11/2014 19:52   Dg Chest Port 1 View  09/13/2014   CLINICAL DATA:  Pneumonia.  Weakness.  EXAM: PORTABLE CHEST - 1 VIEW  COMPARISON:  09/11/2014  FINDINGS: Cardiomediastinal silhouette is unchanged with slight enlargement of the cardiac silhouette and tortuosity of the aorta. Thoracic aortic calcification is noted. Right lower lobe airspace opacity has mildly improved in the interim although patchy opacity remains. Minimal left basilar opacity is unchanged and may represent subsegmental atelectasis. There is no evidence of new airspace consolidation, edema, pleural effusion, or pneumothorax. No acute osseous abnormality is identified.  IMPRESSION: Partial interval clearing of right lower lobe opacity, consistent with improving pneumonia. Further radiographic follow-up recommended to ensure complete resolution.   Electronically Signed   By: Sebastian AcheAllen  Grady   On: 09/13/2014 12:32    Micro Results      Recent Results (from the past 240 hour(s))  MRSA PCR Screening     Status: None   Collection Time: 09/12/14  3:17 AM  Result Value Ref Range Status   MRSA by PCR NEGATIVE NEGATIVE Final    Comment:        The GeneXpert MRSA Assay (FDA approved for NASAL specimens only), is one component of a comprehensive MRSA colonization surveillance program. It is not intended to diagnose MRSA infection nor to guide or monitor treatment for MRSA infections.   Stool culture     Status: None (Preliminary result)   Collection Time: 09/12/14  3:17 AM  Result Value Ref Range Status   Specimen Description STOOL  Final   Special Requests Normal  Final   Culture   Final    NO SUSPICIOUS COLONIES, CONTINUING TO HOLD Performed at [facility]    Report Status PENDING  Incomplete    Clostridium Difficile by PCR     Status: None   Collection Time: 09/12/14  3:17 AM  Result Value Ref Range Status   C difficile by pcr NEGATIVE NEGATIVE Final    Comment: Performed at [facility]  Culture, blood (routine x 2)     Status: None (Preliminary result)   Collection Time: 09/12/14  5:50 AM  Result Value Ref Range Status   Specimen Description BLOOD RIGHT ARM  Final   Special Requests BOTTLES DRAWN AEROBIC AND ANAEROBIC 10 CC EACH  Final   Culture   Final           BLOOD CULTURE RECEIVED NO GROWTH TO DATE CULTURE WILL BE HELD FOR 5 DAYS BEFORE ISSUING A FINAL NEGATIVE REPORT Note: Culture results may be compromised due to an excessive volume of blood received in culture bottles. Performed at [facility]    Report Status PENDING  Incomplete  Culture, blood (routine x 2)     Status: None (Preliminary result)   Collection Time: 09/12/14  5:58 AM  Result Value Ref Range Status   Specimen Description BLOOD RIGHT HAND  Final   Special Requests BOTTLES DRAWN AEROBIC AND ANAEROBIC 10 CC EACH  Final   Culture  Final           BLOOD CULTURE RECEIVED NO GROWTH TO DATE CULTURE WILL BE HELD FOR 5 DAYS BEFORE ISSUING A FINAL NEGATIVE REPORT Note: Culture results may be compromised due to an excessive volume of blood received in culture bottles. Performed at [facility]    Report Status PENDING  Incomplete    Today   Subjective:   Darron Stuck today has no headache,no chest abdominal pain,no new weakness tingling or numbness, feels much better wants to go to ALF today.    Objective:   Blood pressure 116/64, pulse 74, temperature 97.8 F (36.6 C), temperature source Oral, resp. rate 18, height 6\' 1"  (1.854 m), weight 56.5 kg (124 lb 9 oz), SpO2 92 %.   Intake/Output Summary (Last 24 hours) at 09/14/14 1223 Last data filed at 09/14/14 1006  Gross per 24 hour  Intake 1389.17 ml  Output    300 ml  Net 1089.17 ml    Exam Awake Alert, Oriented x 3,  No new F.N deficits, Normal affect [place].AT,PERRAL Supple Neck,No JVD, No cervical lymphadenopathy appriciated.  Symmetrical Chest wall movement, Good air movement bilaterally, CTAB RRR,No Gallops,Rubs or new Murmurs, No Parasternal Heave +ve B.Sounds, Abd Soft, Non tender, No organomegaly appriciated, No rebound -guarding or rigidity. No Cyanosis, Clubbing or edema, No new Rash or bruise  Data Review   CBC w Diff: Lab Results  Component Value Date   WBC 9.4 09/14/2014   HGB 8.9* 09/14/2014   HCT 26.7* 09/14/2014   PLT 246 09/14/2014   LYMPHOPCT 7* 09/12/2014   MONOPCT 5 09/12/2014   EOSPCT 1 09/12/2014   BASOPCT 0 09/12/2014    CMP: Lab Results  Component Value Date   NA 133* 09/14/2014   K 5.0 09/14/2014   CL 105 09/14/2014   CO2 22 09/14/2014   BUN 31* 09/14/2014   CREATININE 1.25* 09/14/2014   PROT 6.3* 09/12/2014   ALBUMIN 3.0* 09/12/2014   BILITOT <0.1* 09/12/2014   ALKPHOS 35* 09/12/2014   AST 27 09/12/2014   ALT 14* 09/12/2014  .   Total Time in preparing paper work, data evaluation and todays exam - 35 minutes  Leroy Sea M.D on 09/14/2014 at 12:23 PM  [facility]   Office  [phone]

## 2014-09-14 NOTE — Progress Notes (Signed)
Initial Nutrition Assessment  DOCUMENTATION CODES:  Severe malnutrition in context of chronic illness  INTERVENTION: - Will order Ensure Enlive BID; mix with Resource Thicken-Up - RD will continue to monitor for needs  NUTRITION DIAGNOSIS:  Inadequate oral intake related to acute illness, lethargy/confusion as evidenced by meal completion < 50%.  GOAL:  Patient will meet greater than or equal to 90% of their needs  MONITOR:  PO intake, Supplement acceptance, Weight trends, Labs, I & O's  REASON FOR ASSESSMENT:  Other (Comment) (Underweight BMI)  ASSESSMENT: 81 y.o. male has a past medical history of Stroke; Acute bronchitis; and Hypertension.  He was brought in from assisted living secondary to nausea vomiting for the past 2 days for total but 3-4 episodes she was recently on Bactrim for urinary tract infection. Also apparently had some loose stools today. No blood in stool no melena. IN ER patient was noted to be coughing severely. Chest x-ray was done showing new right lower lobe airspace disease consistent pneumonia.  Pt seen for underweight BMI. Unable to perform physical assessment at this time due to RN notes of pt being agitated and confused this AM, refusing PO meds and pulling at lines; Haldol has been given. Visualized at least moderate muscle wasting but unable to fully confirm this until physical assessment able to be performed.  No family present to provide information. Diet was advanced yesterday afternoon and pt ate 20% of dinner last night. Tech indicates that pt ate 75% of grits, eggs for breakfast and drank coffee and orange juice. Per notes, pt was drinking Ensure at facility PTA; will order and advise that Resource Thicken-Up, which has already been ordered, be mixed in due to need for nectar-thick liquids.  Likely not meeting needs at this time. Medications reviewed. Labs reviewed; Na: 133 mmol/L, BUN/creatinine elevated but trending down, Ca low, GFR:  47.  Height:  Ht Readings from Last 1 Encounters:  09/12/14 6\' 1"  (1.854 m)    Weight:  Wt Readings from Last 1 Encounters:  09/12/14 124 lb 9 oz (56.5 kg)    Ideal Body Weight:  83.6 kg (kg)  Wt Readings from Last 10 Encounters:  09/12/14 124 lb 9 oz (56.5 kg)  03/19/14 138 lb (62.596 kg)  09/12/12 154 lb 3.2 oz (69.945 kg)  02/10/12 160 lb 11.5 oz (72.9 kg)    BMI:  Body mass index is 16.44 kg/(m^2).  Estimated Nutritional Needs:  Kcal:  1400-1600  Protein:  55-70 grams  Fluid:  2-2.2 L/day  Skin:  Wound (see comment) (Stage 1 bilateral Ischial tuberosity pressure ulcer)  Diet Order:  DIET DYS 3 Room service appropriate?: Yes with Assist; Fluid consistency:: Nectar Thick  EDUCATION NEEDS:  No education needs identified at this time   Intake/Output Summary (Last 24 hours) at 09/14/14 0916 Last data filed at 09/14/14 0500  Gross per 24 hour  Intake 1389.17 ml  Output      0 ml  Net 1389.17 ml    Last BM:  6/6   Trenton GammonJessica Kaede Clendenen, RD, LDN Inpatient Clinical Dietitian Pager # [phone] After hours/weekend pager # [phone]

## 2014-09-14 NOTE — Progress Notes (Signed)
[facility]. Plans confirmed with ALF staff and son, Dr. Lequita HaltMorgan.  plan transfer via EMS. Reece LevyJanet Jaynee Winters, MSW, Theresia MajorsLCSWA [phone]

## 2014-09-15 LAB — STOOL CULTURE: Special Requests: NORMAL

## 2014-09-17 ENCOUNTER — Encounter (HOSPITAL_COMMUNITY): Payer: Self-pay | Admitting: Nurse Practitioner

## 2014-09-17 ENCOUNTER — Emergency Department (HOSPITAL_COMMUNITY)
Admission: EM | Admit: 2014-09-17 | Discharge: 2014-09-17 | Disposition: A | Discharge status: Home / Self Care | Payer: Medicare Other | Attending: Emergency Medicine | Admitting: Emergency Medicine

## 2014-09-17 ENCOUNTER — Emergency Department (HOSPITAL_COMMUNITY): Payer: Medicare Other

## 2014-09-17 DIAGNOSIS — I10 Essential (primary) hypertension: Secondary | ICD-10-CM | POA: Diagnosis not present

## 2014-09-17 DIAGNOSIS — Z8673 Personal history of transient ischemic attack (TIA), and cerebral infarction without residual deficits: Secondary | ICD-10-CM | POA: Insufficient documentation

## 2014-09-17 DIAGNOSIS — R319 Hematuria, unspecified: Secondary | ICD-10-CM | POA: Diagnosis not present

## 2014-09-17 DIAGNOSIS — R404 Transient alteration of awareness: Secondary | ICD-10-CM

## 2014-09-17 DIAGNOSIS — R4182 Altered mental status, unspecified: Secondary | ICD-10-CM | POA: Insufficient documentation

## 2014-09-17 DIAGNOSIS — Z79899 Other long term (current) drug therapy: Secondary | ICD-10-CM | POA: Diagnosis not present

## 2014-09-17 DIAGNOSIS — R531 Weakness: Secondary | ICD-10-CM | POA: Diagnosis not present

## 2014-09-17 DIAGNOSIS — Z792 Long term (current) use of antibiotics: Secondary | ICD-10-CM | POA: Insufficient documentation

## 2014-09-17 DIAGNOSIS — J209 Acute bronchitis, unspecified: Secondary | ICD-10-CM | POA: Diagnosis not present

## 2014-09-17 DIAGNOSIS — Z880 Allergy status to penicillin: Secondary | ICD-10-CM | POA: Diagnosis not present

## 2014-09-17 LAB — CBC WITH DIFFERENTIAL/PLATELET
Basophils Absolute: 0 K/uL (ref 0.0–0.1)
Basophils Relative: 0 % (ref 0–1)
Eosinophils Absolute: 0.4 K/uL (ref 0.0–0.7)
Eosinophils Relative: 8 % — ABNORMAL HIGH (ref 0–5)
HCT: 26.5 % — ABNORMAL LOW (ref 39.0–52.0)
Hemoglobin: 9.1 g/dL — ABNORMAL LOW (ref 13.0–17.0)
Lymphocytes Relative: 17 % (ref 12–46)
Lymphs Abs: 0.9 K/uL (ref 0.7–4.0)
MCH: 32.4 pg (ref 26.0–34.0)
MCHC: 34.3 g/dL (ref 30.0–36.0)
MCV: 94.3 fL (ref 78.0–100.0)
Monocytes Absolute: 0.8 K/uL (ref 0.1–1.0)
Monocytes Relative: 15 % — ABNORMAL HIGH (ref 3–12)
Neutro Abs: 3.4 K/uL (ref 1.7–7.7)
Neutrophils Relative %: 60 % (ref 43–77)
Platelets: 360 K/uL (ref 150–400)
RBC: 2.81 MIL/uL — ABNORMAL LOW (ref 4.22–5.81)
RDW: 16.8 % — ABNORMAL HIGH (ref 11.5–15.5)
WBC: 5.5 K/uL (ref 4.0–10.5)

## 2014-09-17 LAB — BASIC METABOLIC PANEL WITH GFR
Anion gap: 10 (ref 5–15)
BUN: 24 mg/dL — ABNORMAL HIGH (ref 6–20)
CO2: 21 mmol/L — ABNORMAL LOW (ref 22–32)
Calcium: 8 mg/dL — ABNORMAL LOW (ref 8.9–10.3)
Chloride: 103 mmol/L (ref 101–111)
Creatinine, Ser: 1.06 mg/dL (ref 0.61–1.24)
GFR calc Af Amer: >60 mL/min
GFR calc non Af Amer: 58 mL/min — ABNORMAL LOW
Glucose, Bld: 103 mg/dL — ABNORMAL HIGH (ref 65–99)
Potassium: 3.7 mmol/L (ref 3.5–5.1)
Sodium: 134 mmol/L — ABNORMAL LOW (ref 135–145)

## 2014-09-17 LAB — URINALYSIS, ROUTINE W REFLEX MICROSCOPIC
Bilirubin Urine: NEGATIVE
Glucose, UA: NEGATIVE mg/dL
Ketones, ur: NEGATIVE mg/dL
Leukocytes, UA: NEGATIVE
Nitrite: NEGATIVE
Protein, ur: 100 mg/dL — AB
Specific Gravity, Urine: 1.017 (ref 1.005–1.030)
Urobilinogen, UA: 0.2 mg/dL (ref 0.0–1.0)
pH: 7 (ref 5.0–8.0)

## 2014-09-17 LAB — URINE MICROSCOPIC-ADD ON

## 2014-09-17 LAB — CBG MONITORING, ED: Glucose-Capillary: 95 mg/dL (ref 65–99)

## 2014-09-17 MED ORDER — SODIUM CHLORIDE 0.9 % IV BOLUS (SEPSIS)
500.0000 mL | Freq: Once | INTRAVENOUS | Status: AC
  Administered 2014-09-17: 500 mL via INTRAVENOUS

## 2014-09-17 MED ORDER — ACETAMINOPHEN 500 MG PO TABS
500.0000 mg | ORAL_TABLET | Freq: Once | ORAL | Status: AC
  Administered 2014-09-17: 500 mg via ORAL
  Filled 2014-09-17: qty 1

## 2014-09-17 NOTE — Discharge Instructions (Signed)

## 2014-09-17 NOTE — ED Notes (Signed)
RN and tech attempted in and out cath for sample; no urine obtained.  Will reattempt after fluid bolus.

## 2014-09-17 NOTE — ED Provider Notes (Signed)
Pt informed of findgins including hematuria - stable for d/c.  Eber Hong, MD 09/17/14 [phone]

## 2014-09-17 NOTE — ED Notes (Signed)
Pt is presented from [facility] on [address], EMS report they were called to bring pt in for further evaluation secondary to weakness and altered mental status from his dementia baseline. Pt's triage vitals and primary assesment unremarkable, denying any pain at this time.

## 2014-09-17 NOTE — ED Provider Notes (Signed)
CSN: [id number]     Arrival date & time 09/17/14  1228 History   First MD Initiated Contact with Patient 09/17/14 1246     Chief Complaint  Patient presents with  . Altered Mental Status  . Weakness     (Consider location/radiation/quality/duration/timing/severity/associated sxs/prior Treatment) HPI Comments: Pt presents with report of decreased LOC from baseline from ALF today. Pt recently d/c'c back to facility on Levaquin/flagyl for pna. No recent falls, has had good PO intake, no recent fevers, has been compliant with meds. GCS 14, no focal neuro findings has no complaints other than feeling "terrible", but cannot describe specific symptoms.    Patient is a 81 y.o. male presenting with altered mental status and weakness. The history is provided by the patient. No language interpreter was used.  Altered Mental Status Presenting symptoms: confusion and disorientation   Severity:  Moderate Most recent episode:  Today Episode history:  Single Timing:  Constant Progression:  Unchanged Chronicity:  New Context: dementia and recent infection   Context comment:  Recent pna Associated symptoms: weakness   Associated symptoms: no abdominal pain, no agitation, no fever, no headaches, no nausea, no rash and no vomiting   Weakness Pertinent negatives include no chest pain, no abdominal pain, no headaches and no shortness of breath.    Past Medical History  Diagnosis Date  . Stroke   . Acute bronchitis   . Hypertension    Past Surgical History  Procedure Laterality Date  . Hernia repair    . Lumbar disc surgery    . Hip arthroplasty Right 08/26/2012    Procedure: RIGHT HEMI HIP ARTHROPLASTY ;  Surgeon: Velna Ochs, MD;  Location: [facility];  Service: Orthopedics;  Laterality: Right;   Family History  Problem Relation Age of Onset  . CVA Mother    History  Substance Use Topics  . Smoking status: Never Smoker   . Smokeless tobacco: Never Used  . Alcohol Use: No    Review  of Systems  Constitutional: Negative for fever, activity change, appetite change and fatigue.  HENT: Negative for congestion, facial swelling, rhinorrhea and trouble swallowing.   Eyes: Negative for photophobia and pain.  Respiratory: Negative for cough, chest tightness and shortness of breath.   Cardiovascular: Negative for chest pain and leg swelling.  Gastrointestinal: Negative for nausea, vomiting, abdominal pain, diarrhea and constipation.  Endocrine: Negative for polydipsia and polyuria.  Genitourinary: Negative for dysuria, urgency, decreased urine volume and difficulty urinating.  Musculoskeletal: Negative for back pain and gait problem.  Skin: Negative for color change, rash and wound.  Allergic/Immunologic: Negative for immunocompromised state.  Neurological: Positive for weakness. Negative for dizziness, facial asymmetry, speech difficulty, numbness and headaches.  Psychiatric/Behavioral: Positive for confusion. Negative for decreased concentration and agitation.      Allergies  Ambien and Penicillins  Home Medications   Prior to Admission medications   Medication Sig Start Date End Date Taking? Authorizing Provider  acetaminophen (MAPAP) 500 MG tablet Take 500 mg by mouth 3 (three) times daily.   Yes Historical Provider, MD  ALPRAZolam (XANAX) 0.25 MG tablet Take 1 tablet (0.25 mg total) by mouth 2 (two) times daily. Take one tablet by mouth every 6 hours as needed for anxiety Patient taking differently: Take 0.25 mg by mouth 2 (two) times daily.  09/14/14  Yes Leroy Sea, MD  divalproex (DEPAKOTE SPRINKLE) 125 MG capsule Take 250 mg by mouth every evening.   Yes Historical Provider, MD  Ensure Plus (ENSURE  PLUS) LIQD Take 237 mLs by mouth 2 (two) times daily.    Yes Historical Provider, MD  finasteride (PROSCAR) 5 MG tablet Take 5 mg by mouth at bedtime.   Yes Historical Provider, MD  furosemide (LASIX) 20 MG tablet Take 10 mg by mouth daily.   Yes Historical Provider,  MD  HYDROcodone-acetaminophen (NORCO) 5-325 MG per tablet Take 1 tablet by mouth every 4 (four) hours as needed for severe pain. 09/14/14  Yes Leroy Sea, MD  levofloxacin (LEVAQUIN) 500 MG tablet Take 1 tablet (500 mg total) by mouth daily. 09/14/14  Yes Leroy Sea, MD  metroNIDAZOLE (FLAGYL) 500 MG tablet Take 1 tablet (500 mg total) by mouth 3 (three) times daily. 09/14/14  Yes Leroy Sea, MD  Multiple Vitamins-Iron (MULTIVITAMINS WITH IRON) TABS tablet Take 1 tablet by mouth every evening.    Yes Historical Provider, MD  ondansetron (ZOFRAN) 4 MG tablet Take 4 mg by mouth every 12 (twelve) hours as needed for nausea or vomiting.   Yes Historical Provider, MD  polyethylene glycol (MIRALAX / GLYCOLAX) packet Take 17 g by mouth daily. 02/11/12  Yes Jessica U Vann, DO  senna-docusate (SENOKOT-S) 8.6-50 MG per tablet Take 1 tablet by mouth every evening.    Yes Historical Provider, MD  sertraline (ZOLOFT) 50 MG tablet Take 50 mg by mouth every evening.    Yes Historical Provider, MD  terazosin (HYTRIN) 5 MG capsule Take 5 mg by mouth at bedtime.   Yes Historical Provider, MD  traMADol (ULTRAM) 50 MG tablet Take 1.5 tablets (75 mg total) by mouth 3 (three) times daily. 09/14/14  Yes Leroy Sea, MD   BP 151/70 mmHg  Pulse 60  Temp(Src) 98.7 F (37.1 C) (Rectal)  Resp 19  SpO2 98% Physical Exam  Constitutional: He is oriented to person, place, and time. He appears well-developed and well-nourished. No distress.  HENT:  Head: Normocephalic and atraumatic.  Mouth/Throat: No oropharyngeal exudate.  Eyes: Pupils are equal, round, and reactive to light.  Neck: Normal range of motion. Neck supple.  Cardiovascular: Normal rate, regular rhythm and normal heart sounds.  Exam reveals no gallop and no friction rub.   No murmur heard. Pulmonary/Chest: Effort normal and breath sounds normal. No respiratory distress. He has no wheezes. He has no rales.  Abdominal: Soft. Bowel sounds are  normal. He exhibits no distension and no mass. There is no tenderness. There is no rebound and no guarding.  Musculoskeletal: Normal range of motion. He exhibits no edema or tenderness.  Neurological: He is alert and oriented to person, place, and time. He has normal strength. He displays no tremor. No cranial nerve deficit or sensory deficit. He exhibits normal muscle tone. Coordination normal. GCS eye subscore is 4. GCS verbal subscore is 4. GCS motor subscore is 6.  Skin: Skin is warm and dry.  Psychiatric: He has a normal mood and affect.    ED Course  Procedures (including critical care time) Labs Review Labs Reviewed  URINE CULTURE  CBC WITH DIFFERENTIAL/PLATELET  BASIC METABOLIC PANEL  URINALYSIS, ROUTINE W REFLEX MICROSCOPIC (NOT AT [facility])  CBG MONITORING, ED    Imaging Review No results found.   EKG Interpretation None      MDM   Final diagnoses:  Alteration consciousness    Pt is a 81 y.o. male with Pmhx as above including dementia who presents with report of decreased LOC from baseline from ALF today. Pt recently d/c'c back to facility on Levaquin/flagyl  for pna. No recent falls, has had good PO intake, no recent fevers, has been compliant with meds. GCS 14, no focal neuro findings has no complaints other than feeling "terrible", but cannot describe specific symptoms. He is mad his ALF shipped him off here without explaining why. He reports fever, chills, denies cough, n/v, d/a, or urinary symptoms.  CXR with persistent bibasilar infiltrates, WBC now nomalized, no fevers, clinically, i do not feel these represent acute recurrent pna. Will rec completing coarse of abx.  Dr Hyacinth Meeker will f/u on UA, though I feel pt's mental status has inproved from that ALF described earlier and he can be d/c'd back to facility for outpt PCP f/u.    Eston Esters evaluation in the Emergency Department is complete. It has been determined that no acute conditions requiring further emergency  intervention are present at this time. The patient/guardian have been advised of the diagnosis and plan. We have discussed signs and symptoms that warrant return to the ED, such as changes or worsening in symptoms, fever, chest pain, SOB, inability to tolerate liquids.       Toy Cookey, MD 09/17/14 1630

## 2014-09-17 NOTE — ED Notes (Signed)
Bed: WA07 Expected date:  Expected time:  Means of arrival:  Comments: AMS

## 2014-09-18 LAB — URINE CULTURE
Colony Count: NO GROWTH
Culture: NO GROWTH

## 2014-09-18 LAB — CULTURE, BLOOD (ROUTINE X 2)
Culture: NO GROWTH
Culture: NO GROWTH

## 2014-10-31 ENCOUNTER — Emergency Department (HOSPITAL_COMMUNITY): Payer: Medicare Other

## 2014-10-31 ENCOUNTER — Encounter (HOSPITAL_COMMUNITY): Payer: Self-pay | Admitting: Emergency Medicine

## 2014-10-31 ENCOUNTER — Inpatient Hospital Stay (HOSPITAL_COMMUNITY)
Admission: EM | Admit: 2014-10-31 | Discharge: 2014-11-04 | DRG: 469 | Disposition: A | Discharge status: Skilled Nursing Facility | Payer: Medicare Other | Attending: Internal Medicine | Admitting: Internal Medicine

## 2014-10-31 DIAGNOSIS — W1839XA Other fall on same level, initial encounter: Secondary | ICD-10-CM | POA: Diagnosis not present

## 2014-10-31 DIAGNOSIS — F419 Anxiety disorder, unspecified: Secondary | ICD-10-CM | POA: Diagnosis present

## 2014-10-31 DIAGNOSIS — W010XXA Fall on same level from slipping, tripping and stumbling without subsequent striking against object, initial encounter: Secondary | ICD-10-CM | POA: Insufficient documentation

## 2014-10-31 DIAGNOSIS — S72002A Fracture of unspecified part of neck of left femur, initial encounter for closed fracture: Secondary | ICD-10-CM | POA: Diagnosis present

## 2014-10-31 DIAGNOSIS — S72002S Fracture of unspecified part of neck of left femur, sequela: Secondary | ICD-10-CM | POA: Diagnosis not present

## 2014-10-31 DIAGNOSIS — E876 Hypokalemia: Secondary | ICD-10-CM | POA: Insufficient documentation

## 2014-10-31 DIAGNOSIS — Y92129 Unspecified place in nursing home as the place of occurrence of the external cause: Secondary | ICD-10-CM | POA: Diagnosis not present

## 2014-10-31 DIAGNOSIS — Z96641 Presence of right artificial hip joint: Secondary | ICD-10-CM | POA: Diagnosis present

## 2014-10-31 DIAGNOSIS — S72012A Unspecified intracapsular fracture of left femur, initial encounter for closed fracture: Principal | ICD-10-CM | POA: Diagnosis present

## 2014-10-31 DIAGNOSIS — R627 Adult failure to thrive: Secondary | ICD-10-CM | POA: Diagnosis present

## 2014-10-31 DIAGNOSIS — S72009A Fracture of unspecified part of neck of unspecified femur, initial encounter for closed fracture: Secondary | ICD-10-CM | POA: Diagnosis present

## 2014-10-31 DIAGNOSIS — Z96642 Presence of left artificial hip joint: Secondary | ICD-10-CM

## 2014-10-31 DIAGNOSIS — M25552 Pain in left hip: Secondary | ICD-10-CM | POA: Diagnosis present

## 2014-10-31 DIAGNOSIS — N189 Chronic kidney disease, unspecified: Secondary | ICD-10-CM | POA: Diagnosis present

## 2014-10-31 DIAGNOSIS — Z7982 Long term (current) use of aspirin: Secondary | ICD-10-CM

## 2014-10-31 DIAGNOSIS — N400 Enlarged prostate without lower urinary tract symptoms: Secondary | ICD-10-CM | POA: Diagnosis present

## 2014-10-31 DIAGNOSIS — Z8673 Personal history of transient ischemic attack (TIA), and cerebral infarction without residual deficits: Secondary | ICD-10-CM

## 2014-10-31 DIAGNOSIS — E43 Unspecified severe protein-calorie malnutrition: Secondary | ICD-10-CM | POA: Diagnosis present

## 2014-10-31 DIAGNOSIS — Z681 Body mass index (BMI) 19 or less, adult: Secondary | ICD-10-CM

## 2014-10-31 DIAGNOSIS — W19XXXA Unspecified fall, initial encounter: Secondary | ICD-10-CM

## 2014-10-31 DIAGNOSIS — R52 Pain, unspecified: Secondary | ICD-10-CM

## 2014-10-31 DIAGNOSIS — T17920A Food in respiratory tract, part unspecified causing asphyxiation, initial encounter: Secondary | ICD-10-CM

## 2014-10-31 DIAGNOSIS — Z01811 Encounter for preprocedural respiratory examination: Secondary | ICD-10-CM

## 2014-10-31 DIAGNOSIS — I129 Hypertensive chronic kidney disease with stage 1 through stage 4 chronic kidney disease, or unspecified chronic kidney disease: Secondary | ICD-10-CM | POA: Diagnosis present

## 2014-10-31 DIAGNOSIS — Z823 Family history of stroke: Secondary | ICD-10-CM

## 2014-10-31 DIAGNOSIS — T17928A Food in respiratory tract, part unspecified causing other injury, initial encounter: Secondary | ICD-10-CM

## 2014-10-31 DIAGNOSIS — F0390 Unspecified dementia without behavioral disturbance: Secondary | ICD-10-CM | POA: Insufficient documentation

## 2014-10-31 DIAGNOSIS — D509 Iron deficiency anemia, unspecified: Secondary | ICD-10-CM | POA: Diagnosis present

## 2014-10-31 LAB — CBC
HCT: 29.5 % — ABNORMAL LOW (ref 39.0–52.0)
Hemoglobin: 9.8 g/dL — ABNORMAL LOW (ref 13.0–17.0)
MCH: 32.8 pg (ref 26.0–34.0)
MCHC: 33.2 g/dL (ref 30.0–36.0)
MCV: 98.7 fL (ref 78.0–100.0)
Platelets: 261 10*3/uL (ref 150–400)
RBC: 2.99 MIL/uL — ABNORMAL LOW (ref 4.22–5.81)
RDW: 16.9 % — ABNORMAL HIGH (ref 11.5–15.5)
WBC: 4.8 10*3/uL (ref 4.0–10.5)

## 2014-10-31 LAB — COMPREHENSIVE METABOLIC PANEL
ALBUMIN: 3.6 g/dL (ref 3.5–5.0)
ALK PHOS: 47 U/L (ref 38–126)
ALT: 13 U/L — ABNORMAL LOW (ref 17–63)
AST: 21 U/L (ref 15–41)
Anion gap: 10 (ref 5–15)
BUN: 17 mg/dL (ref 6–20)
CHLORIDE: 102 mmol/L (ref 101–111)
CO2: 24 mmol/L (ref 22–32)
Calcium: 8.8 mg/dL — ABNORMAL LOW (ref 8.9–10.3)
Creatinine, Ser: 1.16 mg/dL (ref 0.61–1.24)
GFR calc Af Amer: 60 mL/min — ABNORMAL LOW (ref >60)
GFR calc non Af Amer: 52 mL/min — ABNORMAL LOW (ref >60)
Glucose, Bld: 100 mg/dL — ABNORMAL HIGH (ref 65–99)
POTASSIUM: 3.9 mmol/L (ref 3.5–5.1)
SODIUM: 136 mmol/L (ref 135–145)
Total Bilirubin: 0.3 mg/dL (ref 0.3–1.2)
Total Protein: 7 g/dL (ref 6.5–8.1)

## 2014-10-31 MED ORDER — ONDANSETRON HCL 4 MG/2ML IJ SOLN
4.0000 mg | Freq: Once | INTRAMUSCULAR | Status: AC
  Administered 2014-10-31: 4 mg via INTRAVENOUS
  Filled 2014-10-31: qty 2

## 2014-10-31 MED ORDER — ASPIRIN EC 81 MG PO TBEC
81.0000 mg | DELAYED_RELEASE_TABLET | Freq: Every day | ORAL | Status: DC
  Administered 2014-10-31: 81 mg via ORAL
  Filled 2014-10-31 (×3): qty 1

## 2014-10-31 MED ORDER — TAB-A-VITE/IRON PO TABS
1.0000 | ORAL_TABLET | Freq: Every evening | ORAL | Status: DC
  Administered 2014-10-31 – 2014-11-03 (×3): 1 via ORAL
  Filled 2014-10-31 (×5): qty 1

## 2014-10-31 MED ORDER — ALPRAZOLAM 0.25 MG PO TABS
0.2500 mg | ORAL_TABLET | Freq: Two times a day (BID) | ORAL | Status: DC | PRN
  Administered 2014-10-31 – 2014-11-03 (×4): 0.25 mg via ORAL
  Filled 2014-10-31 (×4): qty 1

## 2014-10-31 MED ORDER — DIVALPROEX SODIUM 125 MG PO CPSP: 125.0000 mg | ORAL_CAPSULE | Freq: Every day | ORAL | Status: DC

## 2014-10-31 MED ORDER — HYDROCODONE-ACETAMINOPHEN 5-325 MG PO TABS: 1.0000 | ORAL_TABLET | ORAL | Status: DC | PRN

## 2014-10-31 MED ORDER — SODIUM CHLORIDE 0.9 % IV SOLN
INTRAVENOUS | Status: DC
  Administered 2014-10-31: 11:00:00 via INTRAVENOUS

## 2014-10-31 MED ORDER — TERAZOSIN HCL 5 MG PO CAPS
5.0000 mg | ORAL_CAPSULE | Freq: Every day | ORAL | Status: DC
  Administered 2014-10-31 – 2014-11-03 (×4): 5 mg via ORAL
  Filled 2014-10-31 (×5): qty 1

## 2014-10-31 MED ORDER — HYDROCODONE-ACETAMINOPHEN 5-325 MG PO TABS
1.0000 | ORAL_TABLET | Freq: Four times a day (QID) | ORAL | Status: DC | PRN
  Administered 2014-10-31: 2 via ORAL
  Administered 2014-11-02 – 2014-11-03 (×3): 1 via ORAL
  Filled 2014-10-31: qty 1
  Filled 2014-10-31 (×2): qty 2
  Filled 2014-10-31: qty 1

## 2014-10-31 MED ORDER — HEPARIN SODIUM (PORCINE) 5000 UNIT/ML IJ SOLN
5000.0000 [IU] | Freq: Three times a day (TID) | INTRAMUSCULAR | Status: DC
  Administered 2014-10-31 – 2014-11-04 (×8): 5000 [IU] via SUBCUTANEOUS
  Filled 2014-10-31 (×15): qty 1

## 2014-10-31 MED ORDER — MORPHINE SULFATE 2 MG/ML IJ SOLN
0.5000 mg | INTRAMUSCULAR | Status: DC | PRN
  Administered 2014-10-31 – 2014-11-02 (×6): 0.5 mg via INTRAVENOUS
  Filled 2014-10-31 (×5): qty 1

## 2014-10-31 MED ORDER — DIVALPROEX SODIUM 125 MG PO CSDR
125.0000 mg | DELAYED_RELEASE_CAPSULE | Freq: Every day | ORAL | Status: DC
  Administered 2014-10-31 – 2014-11-03 (×4): 125 mg via ORAL
  Filled 2014-10-31 (×5): qty 1

## 2014-10-31 MED ORDER — PRO-STAT SUGAR FREE PO LIQD
30.0000 mL | Freq: Every day | ORAL | Status: DC
  Administered 2014-11-03 – 2014-11-04 (×2): 30 mL via ORAL
  Filled 2014-10-31 (×5): qty 30

## 2014-10-31 MED ORDER — BISACODYL 5 MG PO TBEC: 5.0000 mg | DELAYED_RELEASE_TABLET | Freq: Every day | ORAL | Status: DC | PRN

## 2014-10-31 MED ORDER — MORPHINE SULFATE 4 MG/ML IJ SOLN
4.0000 mg | Freq: Once | INTRAMUSCULAR | Status: AC
  Administered 2014-10-31: 4 mg via INTRAVENOUS
  Filled 2014-10-31: qty 1

## 2014-10-31 MED ORDER — ONDANSETRON HCL 4 MG/2ML IJ SOLN: 4.0000 mg | Freq: Three times a day (TID) | INTRAMUSCULAR | Status: DC | PRN

## 2014-10-31 MED ORDER — FERROUS SULFATE 325 (65 FE) MG PO TABS
325.0000 mg | ORAL_TABLET | Freq: Three times a day (TID) | ORAL | Status: DC
  Administered 2014-10-31 – 2014-11-04 (×7): 325 mg via ORAL
  Filled 2014-10-31 (×14): qty 1

## 2014-10-31 MED ORDER — FINASTERIDE 5 MG PO TABS
5.0000 mg | ORAL_TABLET | Freq: Every day | ORAL | Status: DC
  Administered 2014-10-31 – 2014-11-03 (×4): 5 mg via ORAL
  Filled 2014-10-31 (×5): qty 1

## 2014-10-31 MED ORDER — SERTRALINE HCL 25 MG PO TABS
37.5000 mg | ORAL_TABLET | Freq: Every day | ORAL | Status: DC
  Administered 2014-10-31: 37.5 mg via ORAL
  Administered 2014-11-01: 25 mg via ORAL
  Administered 2014-11-02 – 2014-11-03 (×2): 37.5 mg via ORAL
  Filled 2014-10-31 (×5): qty 1.5

## 2014-10-31 MED ORDER — MORPHINE SULFATE 2 MG/ML IJ SOLN
INTRAMUSCULAR | Status: AC
  Administered 2014-10-31: 0.5 mg via INTRAVENOUS
  Filled 2014-10-31: qty 1

## 2014-10-31 NOTE — ED Notes (Signed)
Bed: ZO10 Expected date: 10/31/14 Expected time: 8:59 AM Means of arrival: Ambulance Comments: Fall Hip injury

## 2014-10-31 NOTE — ED Provider Notes (Signed)
CSN: [id number]     Arrival date & time 10/31/14  4098 History   First MD Initiated Contact with Patient 10/31/14 [phone]     Chief Complaint  Patient presents with  . Hip Pain  . Fall     (Consider location/radiation/quality/duration/timing/severity/associated sxs/prior Treatment) Patient is a 81 y.o. male presenting with hip pain and fall. The history is provided by the patient and the EMS personnel. The history is limited by the condition of the patient.  Hip Pain  Fall  Patient w hx dementia, s/p fall at [facility] this morning. Pt had gotten up from breakfast, tripped, and fallen onto left side.  Post fall, pt c/o left hip pain. Pt limited historian, dementia - level 5 caveat.     Past Medical History  Diagnosis Date  . Stroke   . Acute bronchitis   . Hypertension    Past Surgical History  Procedure Laterality Date  . Hernia repair    . Lumbar disc surgery    . Hip arthroplasty Right 08/26/2012    Procedure: RIGHT HEMI HIP ARTHROPLASTY ;  Surgeon: Velna Ochs, MD;  Location: [facility];  Service: Orthopedics;  Laterality: Right;   Family History  Problem Relation Age of Onset  . CVA Mother    History  Substance Use Topics  . Smoking status: Never Smoker   . Smokeless tobacco: Never Used  . Alcohol Use: No       Review of Systems  Unable to perform ROS: Dementia  level 5 caveat    Allergies  Ambien and Penicillins  Home Medications   Prior to Admission medications   Medication Sig Start Date End Date Taking? Authorizing Provider  acetaminophen (MAPAP) 500 MG tablet Take 500 mg by mouth 3 (three) times daily.    Historical Provider, MD  ALPRAZolam Prudy Feeler) 0.25 MG tablet Take 1 tablet (0.25 mg total) by mouth 2 (two) times daily. Take one tablet by mouth every 6 hours as needed for anxiety Patient taking differently: Take 0.25 mg by mouth 2 (two) times daily.  09/14/14   Leroy Sea, MD  divalproex (DEPAKOTE SPRINKLE) 125 MG capsule Take 250 mg by mouth every  evening.    Historical Provider, MD  Ensure Plus (ENSURE PLUS) LIQD Take 237 mLs by mouth 2 (two) times daily.     Historical Provider, MD  finasteride (PROSCAR) 5 MG tablet Take 5 mg by mouth at bedtime.    Historical Provider, MD  furosemide (LASIX) 20 MG tablet Take 10 mg by mouth daily.    Historical Provider, MD  HYDROcodone-acetaminophen (NORCO) 5-325 MG per tablet Take 1 tablet by mouth every 4 (four) hours as needed for severe pain. 09/14/14   Leroy Sea, MD  levofloxacin (LEVAQUIN) 500 MG tablet Take 1 tablet (500 mg total) by mouth daily. 09/14/14   Leroy Sea, MD  metroNIDAZOLE (FLAGYL) 500 MG tablet Take 1 tablet (500 mg total) by mouth 3 (three) times daily. 09/14/14   Leroy Sea, MD  Multiple Vitamins-Iron (MULTIVITAMINS WITH IRON) TABS tablet Take 1 tablet by mouth every evening.     Historical Provider, MD  ondansetron (ZOFRAN) 4 MG tablet Take 4 mg by mouth every 12 (twelve) hours as needed for nausea or vomiting.    Historical Provider, MD  polyethylene glycol (MIRALAX / GLYCOLAX) packet Take 17 g by mouth daily. 02/11/12   Joseph Art, DO  senna-docusate (SENOKOT-S) 8.6-50 MG per tablet Take 1 tablet by mouth every evening.  Historical Provider, MD  sertraline (ZOLOFT) 50 MG tablet Take 50 mg by mouth every evening.     Historical Provider, MD  terazosin (HYTRIN) 5 MG capsule Take 5 mg by mouth at bedtime.    Historical Provider, MD  traMADol (ULTRAM) 50 MG tablet Take 1.5 tablets (75 mg total) by mouth 3 (three) times daily. 09/14/14   Leroy Sea, MD   BP 123/53 mmHg  Pulse 69  Temp(Src) 97.8 F (36.6 C) (Oral)  Resp 18  SpO2 93% Physical Exam  Constitutional: He appears well-developed and well-nourished. No distress.  HENT:  Head: Atraumatic.  Mouth/Throat: Oropharynx is clear and moist.  Eyes: Conjunctivae are normal. Pupils are equal, round, and reactive to light. No scleral icterus.  Neck: Neck supple. No tracheal deviation present.   Cardiovascular: Normal rate, normal heart sounds and intact distal pulses.   Pulmonary/Chest: Effort normal and breath sounds normal. No accessory muscle usage. No respiratory distress. He exhibits no tenderness.  Abdominal: Soft. He exhibits no distension. There is no tenderness.  Musculoskeletal: Normal range of motion.  Pain w rom left hip, otherwise good rom bil ext without pain or focal bony tenderness. Distal pulses palp. CTLS spine, non tender, aligned, no step off. Mild bil lower leg/ankle edema, symmetric.   Neurological: He is alert.  Eyes open. Awake and alert. Moves bil ext w good strength.   Skin: Skin is warm and dry. He is not diaphoretic.  Psychiatric:  Calls out/intermittently agitated.   Nursing note and vitals reviewed.   ED Course  Procedures (including critical care time) Labs Review  Results for orders placed or performed during the hospital encounter of 10/31/14  CBC  Result Value Ref Range   WBC 4.8 4.0 - 10.5 K/uL   RBC 2.99 (L) 4.22 - 5.81 MIL/uL   Hemoglobin 9.8 (L) 13.0 - 17.0 g/dL   HCT 16.1 (L) 09.6 - 04.5 %   MCV 98.7 78.0 - 100.0 fL   MCH 32.8 26.0 - 34.0 pg   MCHC 33.2 30.0 - 36.0 g/dL   RDW 40.9 (H) 81.1 - 91.4 %   Platelets 261 150 - 400 K/uL  Comprehensive metabolic panel  Result Value Ref Range   Sodium 136 135 - 145 mmol/L   Potassium 3.9 3.5 - 5.1 mmol/L   Chloride 102 101 - 111 mmol/L   CO2 24 22 - 32 mmol/L   Glucose, Bld 100 (H) 65 - 99 mg/dL   BUN 17 6 - 20 mg/dL   Creatinine, Ser 7.82 0.61 - 1.24 mg/dL   Calcium 8.8 (L) 8.9 - 10.3 mg/dL   Total Protein 7.0 6.5 - 8.1 g/dL   Albumin 3.6 3.5 - 5.0 g/dL   AST 21 15 - 41 U/L   ALT 13 (L) 17 - 63 U/L   Alkaline Phosphatase 47 38 - 126 U/L   Total Bilirubin 0.3 0.3 - 1.2 mg/dL   GFR calc non Af Amer 52 (L) >60 mL/min   GFR calc Af Amer 60 (L) >60 mL/min   Anion gap 10 5 - 15   Dg Chest 1 View  10/31/2014   CLINICAL DATA:  Confusion. Status post fall with a left hip fracture.  Preoperative exam.  EXAM: CHEST  1 VIEW  COMPARISON:  PA and lateral chest 09/17/2014.  FINDINGS: The lungs are clear. Heart size is normal. There is no pneumothorax or pleural effusion. No focal bony abnormality.  IMPRESSION: No acute disease.   Electronically Signed   By: Maisie Fus  Dalessio M.D.   On: 10/31/2014 10:36   Dg Hip Unilat W Or W/o Pelvis 2-3 Views Left  10/31/2014   CLINICAL DATA:  Fall today with left hip pain.  Initial encounter.  EXAM: DG HIP (WITH OR WITHOUT PELVIS) 2-3V LEFT  COMPARISON:  Pelvic and right hip films on 03/17/2014  FINDINGS: There is evidence of an acute subcapital fracture of the left hip with mild impaction and angulation present. No dislocation. The bony pelvis is intact without evidence of fracture. Visualized aspect of a bipolar prosthesis on the right shows stable and normal alignment.  IMPRESSION: Acute left-sided subcapital hip fracture with mild impaction and angulation.   Electronically Signed   By: Irish Lack M.D.   On: 10/31/2014 09:54       EKG Interpretation   Date/Time:  Sunday October 31 2014 10:25:51 EDT Ventricular Rate:  76 PR Interval:  335 QRS Duration: 101 QT Interval:  403 QTC Calculation: 453 R Axis:   2 Text Interpretation:  Sinus rhythm Prolonged PR interval No significant  change since last tracing Poor data quality Confirmed by Denton Lank  MD, Caryn Bee  ([id number]) on 10/31/2014 11:30:04 AM      MDM   Iv ns. Morphine iv. zofran iv.  Xrays.  Reviewed nursing notes and prior charts for additional history.   Discussed pt with his orthopedist, Dr Jerl Santos - he will see in hospital later today.  Medical service contacted for admission.   Temp hip fx admit orders placed.  Recheck pain improved.   Distal pulses palp.    Cathren Laine, MD 10/31/14 1131

## 2014-10-31 NOTE — Consult Note (Signed)
Bethel Sirois, MD  Mike Carnaghi, PA-C  Andrew Nida, PA-C                                  [facility]                [address], [place], [place]  [address]   ORTHOPAEDIC CONSULTATION  Russell [name]            MRN:  [id number] DOB/SEX:  01/30/1917/male     CHIEF COMPLAINT:  Painful left hip  HISTORY: Russell A Morganis a 81 y.o. male with recent fall at nursing home.  Tells me he normally uses a wheelchair but was noted to have fallen during breakfast.  Noted to have hip fracture and admitted to medicine with ortho consult.     PAST MEDICAL HISTORY: Patient Active Problem List   Diagnosis Date Noted  . Closed left hip fracture 10/31/2014  . Hip fracture 10/31/2014  . HCAP (healthcare-associated pneumonia) 09/11/2014  . Nausea vomiting and diarrhea 09/11/2014  . Hypoxic   . Fall 03/17/2014  . Pubic ramus fracture 03/17/2014  . H/O: stroke 03/17/2014  . Pelvic fracture 03/17/2014  . BPH (benign prostatic hyperplasia) 09/04/2012  . Fracture of right hip 08/25/2012  . Pneumonia 02/08/2012  . Lung mass 02/08/2012  . Chronic kidney disease 02/06/2012  . Weakness 02/06/2012  . Anemia 02/06/2012   Past Medical History  Diagnosis Date  . Stroke   . Acute bronchitis   . Hypertension    Past Surgical History  Procedure Laterality Date  . Hernia repair    . Lumbar disc surgery    . Hip arthroplasty Right 08/26/2012    Procedure: RIGHT HEMI HIP ARTHROPLASTY ;  Surgeon: Yuliana Vandrunen G Saralyn Willison, MD;  Location: [facility];  Service: Orthopedics;  Laterality: Right;  . Joint replacement      right total hip  . Back surgery      lumbar disc surgery     MEDICATIONS:   Current facility-administered medications:  .  ALPRAZolam (XANAX) tablet 0.25 mg, 0.25 mg, Oral, BID PRN, Orlando Vega, MD .  aspirin EC tablet 81 mg, 81 mg, Oral, Daily, Orlando Vega, MD, 81 mg at 10/31/14 1558 .  bisacodyl (DULCOLAX) EC tablet 5 mg, 5 mg, Oral, Daily PRN, Orlando Vega, MD .  divalproex  (DEPAKOTE SPRINKLE) capsule 125 mg, 125 mg, Oral, QHS, Orlando Vega, MD .  feeding supplement (PRO-STAT SUGAR FREE 64) liquid 30 mL, 30 mL, Oral, Daily, Orlando Vega, MD, 30 mL at 10/31/14 1800 .  ferrous sulfate tablet 325 mg, 325 mg, Oral, TID PC, Orlando Vega, MD, 325 mg at 10/31/14 1723 .  finasteride (PROSCAR) tablet 5 mg, 5 mg, Oral, QHS, Orlando Vega, MD .  heparin injection 5,000 Units, 5,000 Units, Subcutaneous, 3 times per day, Orlando Vega, MD, 5,000 Units at 10/31/14 1558 .  HYDROcodone-acetaminophen (NORCO/VICODIN) 5-325 MG per tablet 1-2 tablet, 1-2 tablet, Oral, Q6H PRN, Orlando Vega, MD .  morphine 2 MG/ML injection 0.5 mg, 0.5 mg, Intravenous, Q2H PRN, Orlando Vega, MD, 0.5 mg at 10/31/14 1321 .  multivitamins with iron tablet 1 tablet, 1 tablet, Oral, QPM, Orlando Vega, MD, 1 tablet at 10/31/14 1723 .  sertraline (ZOLOFT) tablet 37.5 mg, 37.5 mg, Oral, QHS, Orlando Vega, MD .  terazosin (HYTRIN) capsule 5 mg, 5 mg, Oral, QHS, Orlando Vega, MD  ALLERGIES:   Allergies  Allergen Reactions  . Ambien [  Zolpidem Tartrate] Other (See Comments)    Unknown reaction  . Penicillins Other (See Comments)    Unknown reaction    REVIEW OF SYSTEMS: REVIEWED IN DETAIL IN CHART  FAMILY HISTORY:   Family History  Problem Relation Age of Onset  . CVA Mother     SOCIAL HISTORY:   History  Substance Use Topics  . Smoking status: Never Smoker   . Smokeless tobacco: Never Used  . 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, says he has a son who is MD at [employer]   EXAMINATION: Vital signs in last 24 hours: Temp:  [97.4 F (36.3 C)-99.3 F (37.4 C)] 99.3 F (37.4 C) (07/24 1317) Pulse Rate:  [66-90] 90 (07/24 1317) Resp:  [18-36] 36 (07/24 1317) BP: (123-137)/(53-71) 137/71 mmHg (07/24 1317) SpO2:  [93 %-97 %] 97 % (07/24 1317) Weight:  [125 lb (56.7 kg)] 125 lb (56.7 kg) (07/24 1600)  BP 137/71 mmHg  Pulse 90  Temp(Src) 99.3 F (37.4 C) (Axillary)  Resp 36   Ht 6' 1" (1.854 m)  Wt 125 lb (56.7 kg)  BMI 16.50 kg/m2  SpO2 97%  General Appearance:    Alert, cooperative, no distress, appears stated age, disoriented to place and time  Head:    Normocephalic, without obvious abnormality, atraumatic  Eyes:    PERRL, conjunctiva/corneas clear, EOM's intact, fundi    benign, both eyes       Ears:    Normal TM's and external ear canals, both ears  Nose:   Nares normal, septum midline, mucosa normal, no drainage    or sinus tenderness  Throat:   Lips, mucosa, and tongue normal; denturesl  Neck:   Supple, symmetrical, trachea midline, no adenopathy;       thyroid:  No enlargement/tenderness/nodules; no carotid   bruit or JVD  Back:     Symmetric, no curvature, ROM normal, no CVA tenderness  Lungs:     Clear to auscultation bilaterally, respirations unlabored  Chest wall:    No tenderness or deformity  Heart:    Regular rate and rhythm, S1 and S2 normal, no murmur, rub   or gallop  Abdomen:     Soft, non-tender, bowel sounds active all four quadrants,    no masses, no organomegaly  Genitalia:    Rectal:    Extremities:   Left leg painful to motion  Pulses:   2+ and symmetric all extremities  Skin:   Skin color, texture, turgor normal, no rashes or lesions  Lymph nodes:   Cervical, supraclavicular, and axillary nodes normal  Neurologic:   CNII-XII intact. Normal strength, sensation and reflexes      throughout    Musculoskeletal Exam:   Left leg SER, other three ext FROM   DIAGNOSTIC STUDIES: Recent laboratory studies:  Recent Labs  10/31/14 1040  WBC 4.8  HGB 9.8*  HCT 29.5*  PLT 261    Recent Labs  10/31/14 1040  NA 136  K 3.9  CL 102  CO2 24  BUN 17  CREATININE 1.16  GLUCOSE 100*  CALCIUM 8.8*   Lab Results  Component Value Date   INR 1.11 08/25/2012     Recent Radiographic Studies :  Dg Chest 1 View  10/31/2014   CLINICAL DATA:  Confusion. Status post fall with a left hip fracture. Preoperative exam.  EXAM: CHEST  1  VIEW  COMPARISON:  PA and lateral chest 09/17/2014.  FINDINGS: The lungs are clear. Heart size is   normal. There is no pneumothorax or pleural effusion. No focal bony abnormality.  IMPRESSION: No acute disease.   Electronically Signed   By: Thomas  Dalessio M.D.   On: 10/31/2014 10:36   Dg Hip Unilat W Or W/o Pelvis 2-3 Views Left  10/31/2014   CLINICAL DATA:  Fall today with left hip pain.  Initial encounter.  EXAM: DG HIP (WITH OR WITHOUT PELVIS) 2-3V LEFT  COMPARISON:  Pelvic and right hip films on 03/17/2014  FINDINGS: There is evidence of an acute subcapital fracture of the left hip with mild impaction and angulation present. No dislocation. The bony pelvis is intact without evidence of fracture. Visualized aspect of a bipolar prosthesis on the right shows stable and normal alignment.  IMPRESSION: Acute left-sided subcapital hip fracture with mild impaction and angulation.   Electronically Signed   By: Glenn  Yamagata M.D.   On: 10/31/2014 09:54    ASSESSMENT:  Left femoral neck fracture   PLAN:  Needs hemi hip like other side two years back.  This will enable him to sit up and get OOB and hopefully walk again.  Will plan on surgery tomorrow or Tuesday depending on [facility] OR schedule which is typically quite full.  May transfer to [facility].  May eat tonight.  Lillyann Ahart G 10/31/2014, 6:52 PM    

## 2014-10-31 NOTE — ED Notes (Signed)
Per EMS: Pt got up for breakfast this morning, tripped, fell onto lt side. C/o lt hip pain.  Unable to assess for shortening or rotation d/t pt not tolerating lying on back.  Hx of rt femur fx but was not a candidate for surgery.  18 g lt forearm.  150 mcg fentanyl given en route.

## 2014-10-31 NOTE — H&P (Signed)
[facility] History and Physical  Russell [name] WUJ:[id number] DOB: 1916/05/07 DOA: 10/31/2014  Referring physician: Dr. Denton Lank PCP: Pcp Not In System   Chief Complaint: Left hip pain  HPI: Russell [name] is a 81 y.o. male  With history of hypertension, chronic kidney disease who presented to the hospital after a fall. Found to have left hip fracture. We were subsequently consulted for medical evaluation recommendations. Orthopedic surgeon consulted   Review of Systems:  Unable to obtain due to dementia  Past Medical History  Diagnosis Date  . Stroke   . Acute bronchitis   . Hypertension    Past Surgical History  Procedure Laterality Date  . Hernia repair    . Lumbar disc surgery    . Hip arthroplasty Right 08/26/2012    Procedure: RIGHT HEMI HIP ARTHROPLASTY ;  Surgeon: Velna Ochs, MD;  Location: [facility];  Service: Orthopedics;  Laterality: Right;  . Joint replacement      right total hip  . Back surgery      lumbar disc surgery   Social History:  reports that he has never smoked. He has never used smokeless tobacco. He reports that he does not drink alcohol or use illicit drugs.  Allergies  Allergen Reactions  . Ambien [Zolpidem Tartrate] Other (See Comments)    Unknown reaction  . Penicillins Other (See Comments)    Unknown reaction    Family History  Problem Relation Age of Onset  . CVA Mother     Prior to Admission medications   Medication Sig Start Date End Date Taking? Authorizing Provider  acetaminophen (MAPAP) 500 MG tablet Take 500 mg by mouth 3 (three) times daily.   Yes Historical Provider, MD  ALPRAZolam (XANAX) 0.25 MG tablet Take 1 tablet (0.25 mg total) by mouth 2 (two) times daily. Take one tablet by mouth every 6 hours as needed for anxiety Patient taking differently: Take 0.25 mg by mouth as directed. Take 1 tablet twice daily scheduled and may also take every 6 hours as needed 09/14/14  Yes Leroy Sea, MD  Amino Acids-Protein  Hydrolys (FEEDING SUPPLEMENT, PRO-STAT SUGAR FREE 64,) LIQD Take 30 mLs by mouth daily. Once daily according to [facility]   Yes Historical Provider, MD  aspirin EC 81 MG tablet Take 81 mg by mouth daily.   Yes Historical Provider, MD  divalproex (DEPAKOTE SPRINKLE) 125 MG capsule Take 125 mg by mouth at bedtime.    Yes Historical Provider, MD  Ensure Plus (ENSURE PLUS) LIQD Take 237 mLs by mouth 2 (two) times daily.    Yes Historical Provider, MD  finasteride (PROSCAR) 5 MG tablet Take 5 mg by mouth at bedtime.   Yes Historical Provider, MD  furosemide (LASIX) 20 MG tablet Take 10 mg by mouth daily.   Yes Historical Provider, MD  HYDROcodone-acetaminophen (NORCO) 5-325 MG per tablet Take 1 tablet by mouth every 4 (four) hours as needed for severe pain. 09/14/14  Yes Leroy Sea, MD  Multiple Vitamins-Iron (MULTIVITAMINS WITH IRON) TABS tablet Take 1 tablet by mouth every evening.    Yes Historical Provider, MD  ondansetron (ZOFRAN) 4 MG tablet Take 4 mg by mouth every 12 (twelve) hours as needed for nausea or vomiting.   Yes Historical Provider, MD  polyethylene glycol (MIRALAX / GLYCOLAX) packet Take 17 g by mouth daily. 02/11/12  Yes Jessica U Vann, DO  senna-docusate (SENOKOT-S) 8.6-50 MG per tablet Take 1 tablet by mouth every evening.    Yes  Historical Provider, MD  sertraline (ZOLOFT) 25 MG tablet Take 37.5 mg by mouth at bedtime.   Yes Historical Provider, MD  terazosin (HYTRIN) 5 MG capsule Take 5 mg by mouth at bedtime.   Yes Historical Provider, MD  traMADol (ULTRAM) 50 MG tablet Take 1.5 tablets (75 mg total) by mouth 3 (three) times daily. 09/14/14  Yes Leroy Sea, MD  levofloxacin (LEVAQUIN) 500 MG tablet Take 1 tablet (500 mg total) by mouth daily. Patient not taking: Reported on 10/31/2014 09/14/14   Leroy Sea, MD  metroNIDAZOLE (FLAGYL) 500 MG tablet Take 1 tablet (500 mg total) by mouth 3 (three) times daily. Patient not taking: Reported on 10/31/2014 09/14/14   Leroy Sea,  MD   Physical Exam: Filed Vitals:   10/31/14 1010 10/31/14 1149 10/31/14 1317 10/31/14 1600  BP: 136/68 136/68 137/71   Pulse: 66 66 90   Temp: 97.4 F (36.3 C) 97.4 F (36.3 C) 99.3 F (37.4 C)   TempSrc: Oral Oral Axillary   Resp: 18 18 36   Height:    6\' 1"  (1.854 m)  Weight:    56.7 kg (125 lb)  SpO2: 94% 94% 97%     Wt Readings from Last 3 Encounters:  10/31/14 56.7 kg (125 lb)  09/12/14 56.5 kg (124 lb 9 oz)  03/19/14 62.596 kg (138 lb)    General:  Appears calm and comfortable Eyes: PERRL, normal lids, irises & conjunctiva ENT: grossly normal hearing, lips & tongue Neck: no LAD, masses or thyromegaly Cardiovascular: RRR, no m/r/g. No LE edema. Telemetry: SR, no arrhythmias  Respiratory: CTA bilaterally, no w/r/r. Normal respiratory effort. Abdomen: soft, ntnd Skin: no rash or induration seen on limited exam Musculoskeletal: Pain over left hip Psychiatric: grossly normal mood and affect, speech fluent and appropriate Neurologic: No facial asymmetry, sensation to light touch intact           Labs on Admission:  Basic Metabolic Panel:  Recent Labs Lab 10/31/14 1040  NA 136  K 3.9  CL 102  CO2 24  GLUCOSE 100*  BUN 17  CREATININE 1.16  CALCIUM 8.8*   Liver Function Tests:  Recent Labs Lab 10/31/14 1040  AST 21  ALT 13*  ALKPHOS 47  BILITOT 0.3  PROT 7.0  ALBUMIN 3.6   No results for input(s): LIPASE, AMYLASE in the last 168 hours. No results for input(s): AMMONIA in the last 168 hours. CBC:  Recent Labs Lab 10/31/14 1040  WBC 4.8  HGB 9.8*  HCT 29.5*  MCV 98.7  PLT 261   Cardiac Enzymes: No results for input(s): CKTOTAL, CKMB, CKMBINDEX, TROPONINI in the last 168 hours.  BNP (last 3 results) No results for input(s): BNP in the last 8760 hours.  ProBNP (last 3 results) No results for input(s): PROBNP in the last 8760 hours.  CBG: No results for input(s): GLUCAP in the last 168 hours.  Radiological Exams on Admission: Dg  Chest 1 View  10/31/2014   CLINICAL DATA:  Confusion. Status post fall with a left hip fracture. Preoperative exam.  EXAM: CHEST  1 VIEW  COMPARISON:  PA and lateral chest 09/17/2014.  FINDINGS: The lungs are clear. Heart size is normal. There is no pneumothorax or pleural effusion. No focal bony abnormality.  IMPRESSION: No acute disease.   Electronically Signed   By: Drusilla Kanner M.D.   On: 10/31/2014 10:36   Dg Hip Unilat W Or W/o Pelvis 2-3 Views Left  10/31/2014   CLINICAL  DATA:  Fall today with left hip pain.  Initial encounter.  EXAM: DG HIP (WITH OR WITHOUT PELVIS) 2-3V LEFT  COMPARISON:  Pelvic and right hip films on 03/17/2014  FINDINGS: There is evidence of an acute subcapital fracture of the left hip with mild impaction and angulation present. No dislocation. The bony pelvis is intact without evidence of fracture. Visualized aspect of a bipolar prosthesis on the right shows stable and normal alignment.  IMPRESSION: Acute left-sided subcapital hip fracture with mild impaction and angulation.   Electronically Signed   By: Irish Lack M.D.   On: 10/31/2014 09:54    EKG: Independently reviewed. Sinus rhythm with no ST elevations or depressions  Assessment/Plan Principal Problem:   Closed left hip fracture - Place routine hip fracture order set please review for details. -Orthopedic surgeon consulted  Active Problems:   Chronic kidney disease - Stable     Code Status: Full DVT Prophylaxis: Heparin Family Communication: none at bedside Disposition Plan: med surge  Time spent: > 35 minutes  [name] [facility] Pager [phone]

## 2014-11-01 ENCOUNTER — Inpatient Hospital Stay (HOSPITAL_COMMUNITY): Payer: Medicare Other

## 2014-11-01 LAB — CBC
HCT: 25.1 % — ABNORMAL LOW (ref 39.0–52.0)
Hemoglobin: 8.2 g/dL — ABNORMAL LOW (ref 13.0–17.0)
MCH: 32 pg (ref 26.0–34.0)
MCHC: 32.7 g/dL (ref 30.0–36.0)
MCV: 98 fL (ref 78.0–100.0)
Platelets: 205 10*3/uL (ref 150–400)
RBC: 2.56 MIL/uL — ABNORMAL LOW (ref 4.22–5.81)
RDW: 17.1 % — AB (ref 11.5–15.5)
WBC: 6 10*3/uL (ref 4.0–10.5)

## 2014-11-01 LAB — BASIC METABOLIC PANEL
ANION GAP: 8 (ref 5–15)
BUN: 21 mg/dL — ABNORMAL HIGH (ref 6–20)
CHLORIDE: 103 mmol/L (ref 101–111)
CO2: 24 mmol/L (ref 22–32)
CREATININE: 1.16 mg/dL (ref 0.61–1.24)
Calcium: 8.1 mg/dL — ABNORMAL LOW (ref 8.9–10.3)
GFR calc Af Amer: 60 mL/min — ABNORMAL LOW (ref >60)
GFR calc non Af Amer: 52 mL/min — ABNORMAL LOW (ref >60)
GLUCOSE: 131 mg/dL — AB (ref 65–99)
Potassium: 3.4 mmol/L — ABNORMAL LOW (ref 3.5–5.1)
SODIUM: 135 mmol/L (ref 135–145)

## 2014-11-01 LAB — SURGICAL PCR SCREEN
MRSA, PCR: POSITIVE — AB
Staphylococcus aureus: POSITIVE — AB

## 2014-11-01 MED ORDER — MEROPENEM 1 G IV SOLR
1.0000 g | Freq: Two times a day (BID) | INTRAVENOUS | Status: DC
  Administered 2014-11-01 – 2014-11-02 (×2): 1 g via INTRAVENOUS
  Filled 2014-11-01 (×5): qty 1

## 2014-11-01 MED ORDER — SODIUM CHLORIDE 0.9 % IV SOLN
INTRAVENOUS | Status: DC
  Administered 2014-11-01: 02:00:00 via INTRAVENOUS

## 2014-11-01 MED ORDER — DIAZEPAM 5 MG/ML IJ SOLN
2.5000 mg | Freq: Once | INTRAMUSCULAR | Status: AC
Start: 2014-11-01 — End: 2014-11-01
  Administered 2014-11-01: 2.5 mg via INTRAVENOUS
  Filled 2014-11-01: qty 2

## 2014-11-01 MED ORDER — VANCOMYCIN HCL IN DEXTROSE 750-5 MG/150ML-% IV SOLN
750.0000 mg | INTRAVENOUS | Status: DC
Start: 2014-11-02 — End: 2014-11-03
  Administered 2014-11-02: 750 mg via INTRAVENOUS
  Filled 2014-11-01: qty 150

## 2014-11-01 MED ORDER — METHOCARBAMOL 1000 MG/10ML IJ SOLN
500.0000 mg | Freq: Four times a day (QID) | INTRAVENOUS | Status: DC | PRN
  Filled 2014-11-01 (×2): qty 5

## 2014-11-01 MED ORDER — ENSURE ENLIVE PO LIQD
237.0000 mL | Freq: Two times a day (BID) | ORAL | Status: DC
  Administered 2014-11-01 – 2014-11-04 (×5): 237 mL via ORAL

## 2014-11-01 MED ORDER — VANCOMYCIN HCL IN DEXTROSE 1-5 GM/200ML-% IV SOLN
1000.0000 mg | Freq: Once | INTRAVENOUS | Status: AC
  Administered 2014-11-01: 1000 mg via INTRAVENOUS
  Filled 2014-11-01: qty 200

## 2014-11-01 NOTE — Progress Notes (Signed)
Initial Nutrition Assessment  DOCUMENTATION CODES:   Severe malnutrition in context of chronic illness, Underweight  INTERVENTION:   Continue 30 ml Prostat daily -provides 100 kcal and 15g of protein Provide Ensure Enlive po BID, each supplement provides 350 kcal and 20 grams of protein Encourage PO intake  NUTRITION DIAGNOSIS:   Malnutrition related to chronic illness as evidenced by severe depletion of body fat, severe depletion of muscle mass.  GOAL:   Patient will meet greater than or equal to 90% of their needs  MONITOR:   PO intake, Supplement acceptance, Labs, Weight trends, Skin, I & O's  REASON FOR ASSESSMENT:   Consult Hip fracture protocol  ASSESSMENT:   81 y.o male With history of hypertension, chronic kidney disease who presented to the hospital after a fall. Found to have left hip fracture.  Pt reports he has been eating well but states that he started to lose weight once he was moved to [facility]. Pt currently eating 100% of his regular diet. Pt previously has been ordered dysphagia 3 diet in other admissions. Pt states he is having no trouble with his food at this time. RD to order Ensure supplements for pt.   Pt scheduled for surgery tomorrow possibly.  Nutrition-Focused physical exam completed. Findings are severe fat depletion, severe muscle depletion, and no edema.   Labs reviewed: Low K Elevated BUN  Diet Order:  Diet regular Room service appropriate?: Yes; Fluid consistency:: Thin  Skin:  Reviewed, no issues  Last BM:  7/25  Height:   Ht Readings from Last 1 Encounters:  10/31/14  (1.854 m)    Weight:   Wt Readings from Last 1 Encounters:  10/31/14 125 lb (56.7 kg)    Ideal Body Weight:  83.6 kg  Wt Readings from Last 10 Encounters:  10/31/14 125 lb (56.7 kg)  09/12/14 124 lb 9 oz (56.5 kg)  03/19/14 138 lb (62.596 kg)  09/12/12 154 lb 3.2 oz (69.945 kg)  02/10/12 160 lb 11.5 oz (72.9 kg)    BMI:  Body mass  index is 16.5 kg/(m^2).  Estimated Nutritional Needs:   Kcal:  1300-1500  Protein:  80-90g  Fluid:  1.5L/day  EDUCATION NEEDS:   No education needs identified at this time  Tilda Franco, MS, RD, LDN Pager: [phone] After Hours Pager: [phone]

## 2014-11-01 NOTE — Progress Notes (Signed)
[facility] PROGRESS NOTE  Russell [name]:[id number] DOB: 26-Nov-1916 DOA: 10/31/2014 PCP: Pcp Not In System  Assessment/Plan: 1. Closed left hip fracture -Continue supportive therapy -Orthopedic surgery on board and assisting with management. - PT evaluation  Code Status: Full Family Communication: No family at bedside Disposition Plan: Pending further evaluation recommendations from orthopedic surgeon and physical therapy but most likely to SNF   Consultants:  Orthopedic surgery  Procedures:  Pending  Antibiotics:  None  HPI/Subjective: Patient has no new complaints. No acute issues overnight  Objective: Filed Vitals:   11/01/14 1353  BP: 116/52  Pulse: 91  Temp: 98.5 F (36.9 C)  Resp: 14    Intake/Output Summary (Last 24 hours) at 11/01/14 1608 Last data filed at 11/01/14 1338  Gross per 24 hour  Intake    600 ml  Output    225 ml  Net    375 ml   Filed Weights   10/31/14 1600  Weight: 56.7 kg (125 lb)    Exam:   General:  Patient in no acute distress, alert and awake  Cardiovascular: Regular rate and rhythm, no murmurs or rubs  Respiratory: Clear to auscultation bilaterally, no wheezes  Abdomen: Soft, nondistended, nontender  Musculoskeletal: No cyanosis or clubbing   Data Reviewed: Basic Metabolic Panel:  Recent Labs Lab 10/31/14 1040 11/01/14 0414  NA 136 135  K 3.9 3.4*  CL 102 103  CO2 24 24  GLUCOSE 100* 131*  BUN 17 21*  CREATININE 1.16 1.16  CALCIUM 8.8* 8.1*   Liver Function Tests:  Recent Labs Lab 10/31/14 1040  AST 21  ALT 13*  ALKPHOS 47  BILITOT 0.3  PROT 7.0  ALBUMIN 3.6   No results for input(s): LIPASE, AMYLASE in the last 168 hours. No results for input(s): AMMONIA in the last 168 hours. CBC:  Recent Labs Lab 10/31/14 1040 11/01/14 0414  WBC 4.8 6.0  HGB 9.8* 8.2*  HCT 29.5* 25.1*  MCV 98.7 98.0  PLT 261 205   Cardiac Enzymes: No results for input(s): CKTOTAL, CKMB, CKMBINDEX,  TROPONINI in the last 168 hours. BNP (last 3 results) No results for input(s): BNP in the last 8760 hours.  ProBNP (last 3 results) No results for input(s): PROBNP in the last 8760 hours.  CBG: No results for input(s): GLUCAP in the last 168 hours.  Recent Results (from the past 240 hour(s))  Surgical pcr screen     Status: Abnormal   Collection Time: 11/01/14  6:45 AM  Result Value Ref Range Status   MRSA, PCR POSITIVE (A) NEGATIVE Final   Staphylococcus aureus POSITIVE (A) NEGATIVE Final    Comment:        The Xpert SA Assay (FDA approved for NASAL specimens in patients over 37 years of age), is one component of a comprehensive surveillance program.  Test performance has been validated by [facility] for patients greater than or equal to 63 year old. It is not intended to diagnose infection nor to guide or monitor treatment. CRITICAL RESULT CALLED TO, READ BACK BY AND VERIFIED WITH: S.WHITTMORE AT 8119 ON 11/01/14 BY S.VANHOORNE      Studies: Dg Chest 1 View  10/31/2014   CLINICAL DATA:  Confusion. Status post fall with a left hip fracture. Preoperative exam.  EXAM: CHEST  1 VIEW  COMPARISON:  PA and lateral chest 09/17/2014.  FINDINGS: The lungs are clear. Heart size is normal. There is no pneumothorax or pleural effusion. No focal bony abnormality.  IMPRESSION:  No acute disease.   Electronically Signed   By: Drusilla Kanner M.D.   On: 10/31/2014 10:36   Dg Hip Unilat W Or W/o Pelvis 2-3 Views Left  10/31/2014   CLINICAL DATA:  Fall today with left hip pain.  Initial encounter.  EXAM: DG HIP (WITH OR WITHOUT PELVIS) 2-3V LEFT  COMPARISON:  Pelvic and right hip films on 03/17/2014  FINDINGS: There is evidence of an acute subcapital fracture of the left hip with mild impaction and angulation present. No dislocation. The bony pelvis is intact without evidence of fracture. Visualized aspect of a bipolar prosthesis on the right shows stable and normal alignment.  IMPRESSION: Acute  left-sided subcapital hip fracture with mild impaction and angulation.   Electronically Signed   By: Irish Lack M.D.   On: 10/31/2014 09:54    Scheduled Meds: . aspirin EC  81 mg Oral Daily  . divalproex  125 mg Oral QHS  . feeding supplement (ENSURE ENLIVE)  237 mL Oral BID BM  . feeding supplement (PRO-STAT SUGAR FREE 64)  30 mL Oral Daily  . ferrous sulfate  325 mg Oral TID PC  . finasteride  5 mg Oral QHS  . heparin  5,000 Units Subcutaneous 3 times per day  . multivitamins with iron  1 tablet Oral QPM  . sertraline  37.5 mg Oral QHS  . terazosin  5 mg Oral QHS   Continuous Infusions: . sodium chloride 75 mL/hr at 11/01/14 0224    Principal Problem:   Closed left hip fracture Active Problems:   Chronic kidney disease   Hip fracture    Time spent: More than 25 minutes    Russell [name]  [facility] Pager [phone]. If 7PM-7AM, please contact night-coverage at [email or web address], password [facility] 11/01/2014, 4:08 PM  LOS: 1 day

## 2014-11-01 NOTE — Progress Notes (Signed)
ANTIBIOTIC CONSULT NOTE - INITIAL  Pharmacy Consult for Vancomycin, Meropenem Indication: Possible aspiration pneumonitis  Allergies  Allergen Reactions  . Ambien [Zolpidem Tartrate] Other (See Comments)    Unknown reaction  . Penicillins Other (See Comments)    Unknown reaction    Patient Measurements: Height:  (185.4 cm) Weight: 125 lb (56.7 kg) IBW/kg (Calculated) : 79.9  Vital Signs: Temp: 100.6 F (38.1 C) (07/25 2149) Temp Source: Axillary (07/25 2149) BP: 133/67 mmHg (07/25 2149) Pulse Rate: 81 (07/25 2149) Intake/Output from previous day: 07/24 0701 - 07/25 0700 In: 360 [P.O.:360] Out: 0  Intake/Output from this shift: Total I/O In: -  Out: 300 [Urine:300]  Labs:  Recent Labs  10/31/14 1040 11/01/14 0414  WBC 4.8 6.0  HGB 9.8* 8.2*  PLT 261 205  CREATININE 1.16 1.16   Estimated Creatinine Clearance: 30.5 mL/min (by C-G formula based on Cr of 1.16). No results for input(s): VANCOTROUGH, VANCOPEAK, VANCORANDOM, GENTTROUGH, GENTPEAK, GENTRANDOM, TOBRATROUGH, TOBRAPEAK, TOBRARND, AMIKACINPEAK, AMIKACINTROU, AMIKACIN in the last 72 hours.   Microbiology: Recent Results (from the past 720 hour(s))  Surgical pcr screen     Status: Abnormal   Collection Time: 11/01/14  6:45 AM  Result Value Ref Range Status   MRSA, PCR POSITIVE (A) NEGATIVE Final   Staphylococcus aureus POSITIVE (A) NEGATIVE Final    Comment:        The Xpert SA Assay (FDA approved for NASAL specimens in patients over 53 years of age), is one component of a comprehensive surveillance program.  Test performance has been validated by [facility] for patients greater than or equal to 22 year old. It is not intended to diagnose infection nor to guide or monitor treatment. CRITICAL RESULT CALLED TO, READ BACK BY AND VERIFIED WITH: S.WHITTMORE AT 1610 ON 11/01/14 BY S.VANHOORNE     Medical History: Past Medical History  Diagnosis Date  . Stroke   . Acute bronchitis   .  Hypertension     Assessment: 81 y/oM with PMH of stroke, HTN, CKD who presented to [facility] ED on 7/24 after a fall at the nursing home, found to have L hip fracture.  Patient was choking on dinner tonight and had continued coughing after dinner. CXR shows new mild bilateral interstitial prominence with bibasilar mixed interstitial airspace density suggesting atelectasis or infection. Pharmacy consulted to dose Vancomycin and Meropenem for possible aspiration pneumonitis. Note patient has PCN allergy, but tolerated Meropenem in June.   7/25 >> Vancomycin >> 7/25 >>Meropenem >>    Tmax: 100.29F WBC WNL SCr 1.16 with CrCl ~ 31 ml/min CG  Goal of Therapy:  Vancomycin trough level 15-20 mcg/ml  Appropriate antibiotic dosing for renal function and indication Eradication of infection  Plan:   Vancomycin 1g IV x 1, then  IV q24h.  Plan for Vancomycin trough level at steady state.  Meropenem 1g IV q12h.  Monitor renal function, cultures, clinical course.  BMET in AM.   Greer Pickerel, PharmD, BCPS Pager: [phone] 11/01/2014 10:22 PM

## 2014-11-01 NOTE — Progress Notes (Signed)
Attending MD paged at 1830 about patients choking on dinner, and continued coughing after dinner. RN concerned, and notified MD. At 1900, there was no return page.   Day RN informed Night RN of patient coughing.

## 2014-11-01 NOTE — Care Management Note (Signed)
Case Management Note  Patient Details  Name: Russell [name] MRN: [id number] Date of Birth: Jun 12, 1916  Subjective/Objective:81 y/o m admitted w/L hip fx.Ortho following.From [facility].                    Action/Plan:d/c plan likely SNF.   Expected Discharge Date:                  Expected Discharge Plan:  Skilled Nursing Facility  In-House Referral:  Clinical Social Work  Discharge planning Services  CM Consult  Post Acute Care Choice:    Choice offered to:     DME Arranged:    DME Agency:     HH Arranged:    HH Agency:     Status of Service:  In process, will continue to follow  Medicare Important Message Given:    Date Medicare IM Given:    Medicare IM give by:    Date Additional Medicare IM Given:    Additional Medicare Important Message give by:     If discussed at Long Length of Stay Meetings, dates discussed:    Additional Comments:  Lanier Clam, RN 11/01/2014, 8:59 PM

## 2014-11-01 NOTE — Progress Notes (Signed)
Utilization review completed.  

## 2014-11-02 ENCOUNTER — Inpatient Hospital Stay (HOSPITAL_COMMUNITY): Payer: Medicare Other

## 2014-11-02 ENCOUNTER — Encounter (HOSPITAL_COMMUNITY)
Admission: EM | Disposition: A | Discharge status: Skilled Nursing Facility | Payer: Self-pay | Source: Home / Self Care | Attending: Family Medicine

## 2014-11-02 ENCOUNTER — Inpatient Hospital Stay (HOSPITAL_COMMUNITY): Payer: Medicare Other | Admitting: Anesthesiology

## 2014-11-02 ENCOUNTER — Encounter (HOSPITAL_COMMUNITY): Payer: Self-pay | Admitting: Anesthesiology

## 2014-11-02 DIAGNOSIS — F0390 Unspecified dementia without behavioral disturbance: Secondary | ICD-10-CM | POA: Insufficient documentation

## 2014-11-02 HISTORY — PX: HIP ARTHROPLASTY: SHX981

## 2014-11-02 LAB — BASIC METABOLIC PANEL
ANION GAP: 8 (ref 5–15)
BUN: 18 mg/dL (ref 6–20)
CALCIUM: 7.8 mg/dL — AB (ref 8.9–10.3)
CHLORIDE: 106 mmol/L (ref 101–111)
CO2: 22 mmol/L (ref 22–32)
Creatinine, Ser: 1.08 mg/dL (ref 0.61–1.24)
GFR calc Af Amer: >60 mL/min (ref >60)
GFR calc non Af Amer: 56 mL/min — ABNORMAL LOW (ref >60)
GLUCOSE: 124 mg/dL — AB (ref 65–99)
Potassium: 3.2 mmol/L — ABNORMAL LOW (ref 3.5–5.1)
Sodium: 136 mmol/L (ref 135–145)

## 2014-11-02 LAB — PREPARE RBC (CROSSMATCH)

## 2014-11-02 LAB — GLUCOSE, CAPILLARY: Glucose-Capillary: 113 mg/dL — ABNORMAL HIGH (ref 65–99)

## 2014-11-02 SURGERY — HEMIARTHROPLASTY, HIP, DIRECT ANTERIOR APPROACH, FOR FRACTURE
Anesthesia: Spinal | Site: Hip | Laterality: Left

## 2014-11-02 MED ORDER — ONDANSETRON HCL 4 MG/2ML IJ SOLN: 4.0000 mg | Freq: Four times a day (QID) | INTRAMUSCULAR | Status: DC | PRN

## 2014-11-02 MED ORDER — MEPERIDINE HCL 50 MG/ML IJ SOLN: 6.2500 mg | INTRAMUSCULAR | Status: DC | PRN

## 2014-11-02 MED ORDER — EPHEDRINE SULFATE 50 MG/ML IJ SOLN
INTRAMUSCULAR | Status: DC | PRN
  Administered 2014-11-02: 10 mg via INTRAVENOUS

## 2014-11-02 MED ORDER — PROPOFOL INFUSION 10 MG/ML OPTIME
INTRAVENOUS | Status: DC | PRN
  Administered 2014-11-02: 100 ug/kg/min via INTRAVENOUS

## 2014-11-02 MED ORDER — PROPOFOL 10 MG/ML IV BOLUS
INTRAVENOUS | Status: AC
  Filled 2014-11-02: qty 20

## 2014-11-02 MED ORDER — SODIUM CHLORIDE 0.9 % IR SOLN
Status: DC | PRN
Start: 2014-11-02 — End: 2014-11-02
  Administered 2014-11-02: 1000 mL

## 2014-11-02 MED ORDER — METOCLOPRAMIDE HCL 10 MG PO TABS: 5.0000 mg | ORAL_TABLET | Freq: Three times a day (TID) | ORAL | Status: DC | PRN

## 2014-11-02 MED ORDER — MENTHOL 3 MG MT LOZG: 1.0000 | LOZENGE | OROMUCOSAL | Status: DC | PRN

## 2014-11-02 MED ORDER — SENNOSIDES-DOCUSATE SODIUM 8.6-50 MG PO TABS
1.0000 | ORAL_TABLET | Freq: Every evening | ORAL | Status: DC
  Administered 2014-11-02 – 2014-11-03 (×2): 1 via ORAL
  Filled 2014-11-02 (×3): qty 1

## 2014-11-02 MED ORDER — KETAMINE HCL 10 MG/ML IJ SOLN
INTRAMUSCULAR | Status: AC
  Filled 2014-11-02: qty 1

## 2014-11-02 MED ORDER — FENTANYL CITRATE (PF) 100 MCG/2ML IJ SOLN: 25.0000 ug | INTRAMUSCULAR | Status: DC | PRN

## 2014-11-02 MED ORDER — ASPIRIN EC 325 MG PO TBEC
325.0000 mg | DELAYED_RELEASE_TABLET | Freq: Two times a day (BID) | ORAL | Status: DC
  Administered 2014-11-03 – 2014-11-04 (×3): 325 mg via ORAL
  Filled 2014-11-02 (×5): qty 1

## 2014-11-02 MED ORDER — ACETAMINOPHEN 325 MG PO TABS
650.0000 mg | ORAL_TABLET | Freq: Four times a day (QID) | ORAL | Status: DC | PRN
  Filled 2014-11-02: qty 2

## 2014-11-02 MED ORDER — CHLORHEXIDINE GLUCONATE 4 % EX LIQD
Freq: Once | CUTANEOUS | Status: DC
  Filled 2014-11-02: qty 15

## 2014-11-02 MED ORDER — PROMETHAZINE HCL 25 MG/ML IJ SOLN: 6.2500 mg | INTRAMUSCULAR | Status: DC | PRN

## 2014-11-02 MED ORDER — ACETAMINOPHEN 650 MG RE SUPP: 650.0000 mg | Freq: Four times a day (QID) | RECTAL | Status: DC | PRN

## 2014-11-02 MED ORDER — PROPOFOL 10 MG/ML IV BOLUS
INTRAVENOUS | Status: DC | PRN
  Administered 2014-11-02: 20 mg via INTRAVENOUS

## 2014-11-02 MED ORDER — CHLORHEXIDINE GLUCONATE CLOTH 2 % EX PADS
6.0000 | MEDICATED_PAD | Freq: Every day | CUTANEOUS | Status: DC
  Administered 2014-11-03: 6 via TOPICAL

## 2014-11-02 MED ORDER — KETAMINE HCL 50 MG/ML IJ SOLN
INTRAMUSCULAR | Status: DC | PRN
  Administered 2014-11-02: 30 mg via INTRAMUSCULAR

## 2014-11-02 MED ORDER — PHENOL 1.4 % MT LIQD: 1.0000 | OROMUCOSAL | Status: DC | PRN

## 2014-11-02 MED ORDER — METOCLOPRAMIDE HCL 5 MG/ML IJ SOLN: 5.0000 mg | Freq: Three times a day (TID) | INTRAMUSCULAR | Status: DC | PRN

## 2014-11-02 MED ORDER — MUPIROCIN 2 % EX OINT
1.0000 "application " | TOPICAL_OINTMENT | Freq: Two times a day (BID) | CUTANEOUS | Status: DC
  Administered 2014-11-02 – 2014-11-04 (×4): 1 via NASAL
  Filled 2014-11-02: qty 22

## 2014-11-02 MED ORDER — ONDANSETRON HCL 4 MG PO TABS: 4.0000 mg | ORAL_TABLET | Freq: Four times a day (QID) | ORAL | Status: DC | PRN

## 2014-11-02 MED ORDER — SODIUM CHLORIDE 0.9 % IV SOLN: Freq: Once | INTRAVENOUS | Status: DC

## 2014-11-02 MED ORDER — BUPIVACAINE HCL (PF) 0.5 % IJ SOLN
INTRAMUSCULAR | Status: AC
  Filled 2014-11-02: qty 30

## 2014-11-02 MED ORDER — LACTATED RINGERS IV SOLN
INTRAVENOUS | Status: DC
  Administered 2014-11-02: 1000 mL via INTRAVENOUS

## 2014-11-02 MED ORDER — GLYCOPYRROLATE 0.2 MG/ML IJ SOLN
INTRAMUSCULAR | Status: DC | PRN
  Administered 2014-11-02: 0.2 mg via INTRAVENOUS

## 2014-11-02 MED ORDER — BUPIVACAINE HCL (PF) 0.5 % IJ SOLN
INTRAMUSCULAR | Status: DC | PRN
  Administered 2014-11-02: 3 mL via INTRATHECAL

## 2014-11-02 SURGICAL SUPPLY — 40 items
BAG SPEC THK2 15X12 ZIP CLS (MISCELLANEOUS) ×1
BAG ZIPLOCK 12X15 (MISCELLANEOUS) ×2 IMPLANT
BLADE SAW SAG 73X25 THK (BLADE) ×1
BLADE SAW SGTL 73X25 THK (BLADE) ×1 IMPLANT
BRUSH FEMORAL CANAL (MISCELLANEOUS) ×1 IMPLANT
CAPT HIP HEMI 1 ×1 IMPLANT
DRAPE INCISE IOBAN 66X45 STRL (DRAPES) ×2 IMPLANT
DRAPE ORTHO SPLIT 77X108 STRL (DRAPES) ×4
DRAPE POUCH INSTRU U-SHP 10X18 (DRAPES) ×2 IMPLANT
DRAPE SURG ORHT 6 SPLT 77X108 (DRAPES) ×2 IMPLANT
DRAPE U-SHAPE 47X51 STRL (DRAPES) ×2 IMPLANT
DRSG AQUACEL AG ADV 3.5X10 (GAUZE/BANDAGES/DRESSINGS) ×1 IMPLANT
ELECT REM PT RETURN 9FT ADLT (ELECTROSURGICAL) ×2
ELECTRODE REM PT RTRN 9FT ADLT (ELECTROSURGICAL) ×1 IMPLANT
FACESHIELD WRAPAROUND (MASK) ×4 IMPLANT
FACESHIELD WRAPAROUND OR TEAM (MASK) ×3 IMPLANT
GAUZE SPONGE 4X4 12PLY STRL (GAUZE/BANDAGES/DRESSINGS) ×4 IMPLANT
GLOVE BIO SURGEON STRL SZ8.5 (GLOVE) ×2 IMPLANT
HANDPIECE INTERPULSE COAX TIP (DISPOSABLE)
HOOD PEEL AWAY FACE SHEILD DIS (HOOD) IMPLANT
IMMOBILIZER KNEE 20 (SOFTGOODS)
IMMOBILIZER KNEE 20 THIGH 36 (SOFTGOODS) ×1 IMPLANT
KIT BASIN OR (CUSTOM PROCEDURE TRAY) ×2 IMPLANT
NDL MA TROC 1/2 (NEEDLE) ×1 IMPLANT
NEEDLE MA TROC 1/2 (NEEDLE) ×2 IMPLANT
PACK TOTAL JOINT (CUSTOM PROCEDURE TRAY) ×2 IMPLANT
PASSER SUT SWANSON 36MM LOOP (INSTRUMENTS) IMPLANT
POSITIONER SURGICAL ARM (MISCELLANEOUS) ×2 IMPLANT
SET HNDPC FAN SPRY TIP SCT (DISPOSABLE) ×1 IMPLANT
STAPLER VISISTAT 35W (STAPLE) ×1 IMPLANT
STRIP CLOSURE SKIN 1/2X4 (GAUZE/BANDAGES/DRESSINGS) ×2 IMPLANT
SUT ETHIBOND NAB CT1 #1 30IN (SUTURE) ×4 IMPLANT
SUT MNCRL AB 3-0 PS2 18 (SUTURE) ×2 IMPLANT
SUT VIC AB 1 CT1 27 (SUTURE) ×4
SUT VIC AB 1 CT1 27XBRD ANTBC (SUTURE) IMPLANT
SUT VIC AB 2-0 CT1 27 (SUTURE) ×4
SUT VIC AB 2-0 CT1 TAPERPNT 27 (SUTURE) ×3 IMPLANT
TOWEL OR 17X26 10 PK STRL BLUE (TOWEL DISPOSABLE) ×2 IMPLANT
TOWER CARTRIDGE SMART MIX (DISPOSABLE) ×1 IMPLANT
TRAY FOLEY W/METER SILVER 14FR (SET/KITS/TRAYS/PACK) ×2 IMPLANT

## 2014-11-02 NOTE — Clinical Documentation Improvement (Signed)
11/01/14 progr note.Marland KitchenMarland Kitchen"Chronic kidney disease- Stable..."  Ref. Range 10/31/2014 10:40 11/01/2014 04:14 11/01/2014 06:45 11/01/2014 10:15 11/01/2014 20:37 11/02/2014 05:06  EGFR (Non-African Amer.) Latest Ref Range: >60 mL/min 52 (L) 52 (L)    56 (L)  TX: Monitor BMP;  0.9 % sodium chloride infusion  75 mL/hr Intravenous Continuous 11/01/14 0224   For accurate Dx specificity & severity can noted "CKD" be further specified w/ stage. Thank you    Possible Clinical Conditions?  _______CKD Stage I - GFR > OR = 90 _______CKD Stage II - GFR 60-80 _______CKD Stage III - GFR 30-59 _______CKD Stage IV - GFR 15-29 _______CKD Stage V - GFR < 15 _______ESRD (End Stage Renal Disease) _______Other condition_____________ _______Cannot Clinically determine   Supporting Information: see above note  Thank You, Ermelinda Das, RN, BSN, CCDS Certified Clinical Documentation Specialist Pager: [place]: Health Information Management

## 2014-11-02 NOTE — Progress Notes (Signed)
Dr. Acey Lav notified of difficulty to assess spinal level due to pt's mental status; pt is oriented to person but not place or situation;  Vital signs stable, 1st unit of blood infusing; pt moved right leg at 1445 but when questioned to move stated "I already did that one time";  OK per Dr. Acey Lav to transfer pt to room 1602 for continued care

## 2014-11-02 NOTE — Op Note (Signed)
PRE-OP DIAGNOSIS:  LEFT HIP FEMORAL NECK FRACTURE  POST-OP DIAGNOSIS:  LEFT HIP FEMORAL NECK FRACTURE  PROCEDURE:  Procedure(s): LEFT HIP HEMI ARTHROPLASTY  (HEMIARTHROPLASTY) ANESTHESIA:  Spinal SURGEON:  Marcene Corning MD ASSISTANT:  Elodia Florence PA-C   INDICATIONS FOR PROCEDURE:  The patient is a 81 y.o. male with a recent history of a painful hip. Hip fracture was diagnosed and hemiarthroplasty is offered as surgical treatment.  Informed operative consent was obtained after discussion of possible complications including reaction to anesthesia, infection, neurovascular injury, dislocation, DVT, PE, and death.  The importance of the postoperative rehab program to optimize result was stressed with the patient.  SUMMARY OF FINDINGS AND PROCEDURE:  Under spinal anesthesia through a standard posterior approach a left hemiarthroplasty was performed.  The patient had minimal degenerative change and fair bone quality.  We used DePuy components to replace the hip and these were size summit 19femur capped with a +0 53mm hip ball.  Elodia Florence assisted throughout and was invaluable to the completion of the case in that he helped position and retract while I performed the procedure.  He also closed simultaneously to help minimize OR time.  DESCRIPTION OF PROCEDURE:  The patient was taken to the OR suite where general anesthetic was applied.  The patient was then positioned in the lateral decubitus position with the left hip up.  All bony prominences were appropriately padded, hip positioners were utilized, and an axillary roll was placed.  Prep and drape was then performed in normal sterile fashion.  The patient was given vancomycin preoperative antibiotic and an appropriate time out was performed.  We then took a posterior approach to the left hip.  Dissection was taken through adipose to the IT band and gluteus maximus fascia.  These structures were incised longitudinally to expose the short external rotators of  the hip which were tagged and reflected.  A posterior capsulectomy was performed and the hip was dislocated.  A femoral neck cut was made above below the fracture site and the femoral head was removed.  The acetabulum was exposed and some labral tissues were excised.   The femur was reamed and then broached to the appropriate size.  A trial reduction was done and the aforementioned head and neck assembly gave [place] the best stability in extension with external rotation and flexion with internal rotation.  Leg lengths were felt to be about equal.  The trial components were removed and the wound irrigated.  We then placed the femoral component in appropriate anteversion.  The head was applied to a dry stem neck and the hip again reduced.  It was again stable in the aforementioned positions.  The would was irrigated again followed by re-approximation of short external rotators to the greater trochanteric region.  IT band and gluteus maximum fascia were repaired with #1 vicryl followed by subcutaneous closure with #O and #2 undyed vicryl.  Skin was closed with staples followed by a sterile dressing.  EBL and IOF can be obtained from anesthesia records.  DISPOSITION:  The patient was extubated in the OR and taken to PACU in stable condition to be admitted to the Orthopedic Surgery for appropriate post-op care to include perioperative antibiotics and DVT prophylaxis.  He started with hemoglobin of 8 and we elected to transfuse intraop.

## 2014-11-02 NOTE — Clinical Social Work Note (Signed)
Clinical Social Work Assessment  Patient Details  Name: [name] MRN: [id number] Date of Birth: [birth year]  Date of referral:  11/02/14               Reason for consult:  Discharge Planning, Facility Placement                Permission sought to share information with:    Permission granted to share information::     Name::        Agency::     Relationship::     Contact Information:     Housing/Transportation Living arrangements for the past 2 months:  [place] of Information:  Facility, Adult Children Patient Interpreter Needed:  None Criminal Activity/Legal Involvement Pertinent to Current Situation/Hospitalization:  No - Comment as needed Significant Relationships:  Adult Children Lives with:  Facility Resident Do you feel safe going back to the place where you live?   (ST Rehab needed.) Need for family participation in patient care:  Yes (Comment)  Care giving concerns:  Higher level is needed at D/C.   Social Worker assessment / plan:  Pt from [facility] hospitalized on 10/31/14 with a hip fx. Pt is in surgery at this time. CSW met with pt's two sons to assist with d/c planning. ST Rehab will be required following hospital d/c. Levada Dy ( [place] ) at [facility] contacted and confirmed ALF would like to readmit pt once rehab has been completed. Pt's sons agree with plan for rehab. SNF search has been initiated and bed offers provided. CSW will continue to assist with d/c planning needs.  Employment status:  Retired Nurse, adult PT Recommendations:  Not assessed at this time Information / Referral to community resources:  [place]  Patient/Family's Response to care: Family feels rehab placement is needed prior to returning to ALF.  Patient/Family's Understanding of and Emotional Response to Diagnosis, Current Treatment, and Prognosis:  Family is aware of pt's medical status. Pt had a hip fx  back in 2014 and went to [facility] for rehab. Family is considering this option. " We want our dad in a rehab that can take care of all his needs. "  Emotional Assessment Appearance:  Appears stated age Attitude/Demeanor/Rapport:  Other, Unable to Assess Levada Dy from [facility] reports pt has good days and bad. and can be uncooperative with care at times.) Affect (typically observed):  Unable to Assess, Other (Pt in surgery.) Orientation:  Oriented to Self Alcohol / Substance use:  Not Applicable Psych involvement (Current and /or in the community):  No (Comment)  Discharge Needs  Concerns to be addressed:  Discharge Planning Concerns Readmission within the last 30 days:  No Current discharge risk:  None Barriers to Discharge:  No Barriers Identified   Loraine Maple [phone] 11/02/2014, 3:51 PM

## 2014-11-02 NOTE — Anesthesia Preprocedure Evaluation (Addendum)
Anesthesia Evaluation  Patient identified by MRN, date of birth, ID band Patient awake and Patient confused    Reviewed: Allergy & Precautions, H&P , NPO status , Patient's Chart, lab work & pertinent test results  Airway Mallampati: II  TM Distance: >3 FB Neck ROM: Full    Dental no notable dental hx.    Pulmonary neg pulmonary ROS,  breath sounds clear to auscultation  Pulmonary exam normal       Cardiovascular hypertension, Pt. on medications negative cardio ROS Normal cardiovascular examRhythm:Regular Rate:Normal     Neuro/Psych CVA negative neurological ROS  negative psych ROS   GI/Hepatic negative GI ROS, Neg liver ROS,   Endo/Other  negative endocrine ROS  Renal/GU negative Renal ROS  negative genitourinary   Musculoskeletal negative musculoskeletal ROS (+)   Abdominal   Peds negative pediatric ROS (+)  Hematology negative hematology ROS (+) Blood dyscrasia, anemia ,   Anesthesia Other Findings   Reproductive/Obstetrics negative OB ROS                             Anesthesia Physical  Anesthesia Plan  ASA: III  Anesthesia Plan: Spinal   Post-op Pain Management:    Induction:   Airway Management Planned: Simple Face Mask  Additional Equipment:   Intra-op Plan:   Post-operative Plan:   Informed Consent: I have reviewed the patients History and Physical, chart, labs and discussed the procedure including the risks, benefits and alternatives for the proposed anesthesia with the patient or authorized representative who has indicated his/her understanding and acceptance.   Dental advisory given  Plan Discussed with: CRNA  Anesthesia Plan Comments:         Anesthesia Quick Evaluation

## 2014-11-02 NOTE — Progress Notes (Signed)
[facility] PROGRESS NOTE  [name]:[id number] DOB: 16-Jan-1917 DOA: 10/31/2014 PCP: Pcp Not In System  Assessment/Plan: 1. Closed left hip fracture - Continue supportive therapy - Orthopedic surgery on board and patient is s/p operation day 0 -  PT evaluation  Code Status: Full Family Communication: No family at bedside Disposition Plan: Pending further evaluation recommendations from orthopedic surgeon and physical therapy but most likely to SNF   Consultants:  Orthopedic surgery  Procedures:  Please refer to notes by orthopedic surgeon  Antibiotics:  None  HPI/Subjective: Patient has no new complaints. No acute issues overnight  Objective: Filed Vitals:   11/02/14 1730  BP: 110/58  Pulse: 63  Temp: 97.8 F (36.6 C)  Resp: 16    Intake/Output Summary (Last 24 hours) at 11/02/14 1810 Last data filed at 11/02/14 1730  Gross per 24 hour  Intake   2350 ml  Output    775 ml  Net   1575 ml   Filed Weights   10/31/14 1600  Weight: 56.7 kg (125 lb)    Exam:   General:  Patient in no acute distress, alert and awake  Cardiovascular: Regular rate and rhythm, no murmurs or rubs  Respiratory: Clear to auscultation bilaterally, no wheezes  Abdomen: Soft, nondistended, nontender  Musculoskeletal: No cyanosis or clubbing   Data Reviewed: Basic Metabolic Panel:  Recent Labs Lab 10/31/14 1040 11/01/14 0414 11/02/14 0506  NA 136 135 136  K 3.9 3.4* 3.2*  CL 102 103 106  CO2 24 24 22   GLUCOSE 100* 131* 124*  BUN 17 21* 18  CREATININE 1.16 1.16 1.08  CALCIUM 8.8* 8.1* 7.8*   Liver Function Tests:  Recent Labs Lab 10/31/14 1040  AST 21  ALT 13*  ALKPHOS 47  BILITOT 0.3  PROT 7.0  ALBUMIN 3.6   No results for input(s): LIPASE, AMYLASE in the last 168 hours. No results for input(s): AMMONIA in the last 168 hours. CBC:  Recent Labs Lab 10/31/14 1040 11/01/14 0414  WBC 4.8 6.0  HGB 9.8* 8.2*  HCT 29.5* 25.1*  MCV 98.7  98.0  PLT 261 205   Cardiac Enzymes: No results for input(s): CKTOTAL, CKMB, CKMBINDEX, TROPONINI in the last 168 hours. BNP (last 3 results) No results for input(s): BNP in the last 8760 hours.  ProBNP (last 3 results) No results for input(s): PROBNP in the last 8760 hours.  CBG:  Recent Labs Lab 11/02/14 1656  GLUCAP 113*    Recent Results (from the past 240 hour(s))  Surgical pcr screen     Status: Abnormal   Collection Time: 11/01/14  6:45 AM  Result Value Ref Range Status   MRSA, PCR POSITIVE (A) NEGATIVE Final   Staphylococcus aureus POSITIVE (A) NEGATIVE Final    Comment:        The Xpert SA Assay (FDA approved for NASAL specimens in patients over 23 years of age), is one component of a comprehensive surveillance program.  Test performance has been validated by [facility] for patients greater than or equal to 17 year old. It is not intended to diagnose infection nor to guide or monitor treatment. CRITICAL RESULT CALLED TO, READ BACK BY AND VERIFIED WITH: S.WHITTMORE AT 4401 ON 11/01/14 BY S.VANHOORNE      Studies: Pelvis Portable  11/02/2014   CLINICAL DATA:  Status post left hip arthroplasty.  EXAM: PORTABLE PELVIS 1-2 VIEWS  COMPARISON:  03/17/2014  FINDINGS: Interval left hip and be arthroplasty. The hardware components are in anatomic  alignment and no complications noted. Gas is identified within the surrounding soft tissues. Stable appearance of right hip arthroplasty device.  IMPRESSION: 1. No complications after left hip he hemi arthroplasty.   Electronically Signed   By: Signa Kell M.D.   On: 11/02/2014 14:27   Dg Chest Port 1 View  11/01/2014   CLINICAL DATA:  Cough and fever, possible aspiration.  EXAM: PORTABLE CHEST - 1 VIEW  COMPARISON:  10/31/2014  FINDINGS: Patient slightly rotated to the right. There is worsening interstitial prominence bilaterally with new mixed interstitial airspace density over the lung bases suggesting infection. No  evidence effusion or pneumothorax. Cardiomediastinal silhouette and remainder of the exam is unchanged.  IMPRESSION: New mild bilateral interstitial prominence with bibasilar mixed interstitial airspace density suggesting atelectasis or infection.   Electronically Signed   By: Elberta Fortis M.D.   On: 11/01/2014 21:07    Scheduled Meds: . sodium chloride   Intravenous Once  . [START ON 11/03/2014] aspirin EC  325 mg Oral BID AC & HS  . chlorhexidine   Topical Once  . Chlorhexidine Gluconate Cloth  6 each Topical Q0600  . divalproex  125 mg Oral QHS  . feeding supplement (ENSURE ENLIVE)  237 mL Oral BID BM  . feeding supplement (PRO-STAT SUGAR FREE 64)  30 mL Oral Daily  . ferrous sulfate  325 mg Oral TID PC  . finasteride  5 mg Oral QHS  . heparin  5,000 Units Subcutaneous 3 times per day  . meropenem (MERREM) IV  1 g Intravenous Q12H  . multivitamins with iron  1 tablet Oral QPM  . mupirocin ointment  1 application Nasal BID  . senna-docusate  1 tablet Oral QPM  . sertraline  37.5 mg Oral QHS  . terazosin  5 mg Oral QHS  . vancomycin  750 mg Intravenous Q24H   Continuous Infusions: . sodium chloride 75 mL/hr at 11/01/14 0224    Principal Problem:   Closed left hip fracture Active Problems:   Chronic kidney disease   Hip fracture    Time spent: More than 25 minutes    [name]  [facility] Pager [phone]. If 7PM-7AM, please contact night-coverage at [email or web address], password [facility] 11/02/2014, 6:10 PM  LOS: 2 days

## 2014-11-02 NOTE — H&P (View-Only) (Signed)
Marcene Corning, MD  Bryna Colander, PA-C  Elodia Florence, PA-C                                  [facility]                [address] [place], [place]  [address]   ORTHOPAEDIC CONSULTATION  [name]            MRN:  [id number] DOB/SEX:  04/10/1916/male     CHIEF COMPLAINT:  Painful left hip  HISTORY: [name] a 81 y.o. male with recent fall at nursing home.  Tells me he normally uses a wheelchair but was noted to have fallen during breakfast.  Noted to have hip fracture and admitted to medicine with ortho consult.     PAST MEDICAL HISTORY: Patient Active Problem List   Diagnosis Date Noted  . Closed left hip fracture 10/31/2014  . Hip fracture 10/31/2014  . HCAP (healthcare-associated pneumonia) 09/11/2014  . Nausea vomiting and diarrhea 09/11/2014  . Hypoxic   . Fall 03/17/2014  . Pubic ramus fracture 03/17/2014  . H/O: stroke 03/17/2014  . Pelvic fracture 03/17/2014  . BPH (benign prostatic hyperplasia) 09/04/2012  . Fracture of right hip 08/25/2012  . Pneumonia 02/08/2012  . Lung mass 02/08/2012  . Chronic kidney disease 02/06/2012  . Weakness 02/06/2012  . Anemia 02/06/2012   Past Medical History  Diagnosis Date  . Stroke   . Acute bronchitis   . Hypertension    Past Surgical History  Procedure Laterality Date  . Hernia repair    . Lumbar disc surgery    . Hip arthroplasty Right 08/26/2012    Procedure: RIGHT HEMI HIP ARTHROPLASTY ;  Surgeon: Velna Ochs, MD;  Location: [facility]S;  Service: Orthopedics;  Laterality: Right;  . Joint replacement      right total hip  . Back surgery      lumbar disc surgery     MEDICATIONS:   Current facility-administered medications:  .  ALPRAZolam (XANAX) tablet 0.25 mg, 0.25 mg, Oral, BID PRN, [name], MD .  aspirin EC tablet 81 mg, 81 mg, Oral, Daily, [name], MD, 81 mg at 10/31/14 1558 .  bisacodyl (DULCOLAX) EC tablet 5 mg, 5 mg, Oral, Daily PRN, [name], MD .  divalproex  (DEPAKOTE SPRINKLE) capsule 125 mg, 125 mg, Oral, QHS, [name], MD .  feeding supplement (PRO-STAT SUGAR FREE 64) liquid 30 mL, 30 mL, Oral, Daily, [name], MD, 30 mL at 10/31/14 1800 .  ferrous sulfate tablet 325 mg, 325 mg, Oral, TID PC, [name], MD, 325 mg at 10/31/14 1723 .  finasteride (PROSCAR) tablet 5 mg, 5 mg, Oral, QHS, [name], MD .  heparin injection 5,000 Units, 5,000 Units, Subcutaneous, 3 times per day, [name], MD, 5,000 Units at 10/31/14 1558 .  HYDROcodone-acetaminophen (NORCO/VICODIN) 5-325 MG per tablet 1-2 tablet, 1-2 tablet, Oral, Q6H PRN, [name], MD .  morphine 2 MG/ML injection 0.5 mg, 0.5 mg, Intravenous, Q2H PRN, [name], MD, 0.5 mg at 10/31/14 1321 .  multivitamins with iron tablet 1 tablet, 1 tablet, Oral, QPM, [name], MD, 1 tablet at 10/31/14 1723 .  sertraline (ZOLOFT) tablet 37.5 mg, 37.5 mg, Oral, QHS, [name], MD .  terazosin (HYTRIN) capsule 5 mg, 5 mg, Oral, QHS, [name], MD  ALLERGIES:   Allergies  Allergen Reactions  . Ambien [  Zolpidem Tartrate] Other (See Comments)    Unknown reaction  . Penicillins Other (See Comments)    Unknown reaction    REVIEW OF SYSTEMS: REVIEWED IN DETAIL IN CHART  FAMILY HISTORY:   Family History  Problem Relation Age of Onset  . CVA Mother     SOCIAL HISTORY:   History  Substance Use Topics  . Smoking status: Never Smoker   . Smokeless tobacco: Never Used  . 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, says he has a son who is MD at [employer]   EXAMINATION: Vital signs in last 24 hours: Temp:  [97.4 F (36.3 C)-99.3 F (37.4 C)] 99.3 F (37.4 C) (07/24 1317) Pulse Rate:  [66-90] 90 (07/24 1317) Resp:  [18-36] 36 (07/24 1317) BP: (123-137)/(53-71) 137/71 mmHg (07/24 1317) SpO2:  [93 %-97 %] 97 % (07/24 1317) Weight:  [125 lb (56.7 kg)] 125 lb (56.7 kg) (07/24 1600)  BP 137/71 mmHg  Pulse 90  Temp(Src) 99.3 F (37.4 C) (Axillary)  Resp 36   Ht 6\' 1"  (1.854 m)  Wt 125 lb (56.7 kg)  BMI 16.50 kg/m2  SpO2 97%  General Appearance:    Alert, cooperative, no distress, appears stated age, disoriented to place and time  Head:    Normocephalic, without obvious abnormality, atraumatic  Eyes:    PERRL, conjunctiva/corneas clear, EOM's intact, fundi    benign, both eyes       Ears:    Normal TM's and external ear canals, both ears  Nose:   Nares normal, septum midline, mucosa normal, no drainage    or sinus tenderness  Throat:   Lips, mucosa, and tongue normal; denturesl  Neck:   Supple, symmetrical, trachea midline, no adenopathy;       thyroid:  No enlargement/tenderness/nodules; no carotid   bruit or JVD  Back:     Symmetric, no curvature, ROM normal, no CVA tenderness  Lungs:     Clear to auscultation bilaterally, respirations unlabored  Chest wall:    No tenderness or deformity  Heart:    Regular rate and rhythm, S1 and S2 normal, no murmur, rub   or gallop  Abdomen:     Soft, non-tender, bowel sounds active all four quadrants,    no masses, no organomegaly  Genitalia:    Rectal:    Extremities:   Left leg painful to motion  Pulses:   2+ and symmetric all extremities  Skin:   Skin color, texture, turgor normal, no rashes or lesions  Lymph nodes:   Cervical, supraclavicular, and axillary nodes normal  Neurologic:   CNII-XII intact. Normal strength, sensation and reflexes      throughout    Musculoskeletal Exam:   Left leg SER, other three ext FROM   DIAGNOSTIC STUDIES: Recent laboratory studies:  Recent Labs  10/31/14 1040  WBC 4.8  HGB 9.8*  HCT 29.5*  PLT 261    Recent Labs  10/31/14 1040  NA 136  K 3.9  CL 102  CO2 24  BUN 17  CREATININE 1.16  GLUCOSE 100*  CALCIUM 8.8*   Lab Results  Component Value Date   INR 1.11 08/25/2012     Recent Radiographic Studies :  Dg Chest 1 View  10/31/2014   CLINICAL DATA:  Confusion. Status post fall with a left hip fracture. Preoperative exam.  EXAM: CHEST  1  VIEW  COMPARISON:  PA and lateral chest 09/17/2014.  FINDINGS: The lungs are clear. Heart size is  normal. There is no pneumothorax or pleural effusion. No focal bony abnormality.  IMPRESSION: No acute disease.   Electronically Signed   By: Drusilla Kanner M.D.   On: 10/31/2014 10:36   Dg Hip Unilat W Or W/o Pelvis 2-3 Views Left  10/31/2014   CLINICAL DATA:  Fall today with left hip pain.  Initial encounter.  EXAM: DG HIP (WITH OR WITHOUT PELVIS) 2-3V LEFT  COMPARISON:  Pelvic and right hip films on 03/17/2014  FINDINGS: There is evidence of an acute subcapital fracture of the left hip with mild impaction and angulation present. No dislocation. The bony pelvis is intact without evidence of fracture. Visualized aspect of a bipolar prosthesis on the right shows stable and normal alignment.  IMPRESSION: Acute left-sided subcapital hip fracture with mild impaction and angulation.   Electronically Signed   By: Irish Lack M.D.   On: 10/31/2014 09:54    ASSESSMENT:  Left femoral neck fracture   PLAN:  Needs hemi hip like other side two years back.  This will enable him to sit up and get OOB and hopefully walk again.  Will plan on surgery tomorrow or Tuesday depending on [facility] OR schedule which is typically quite full.  May transfer to [facility].  May eat tonight.  Kenney Going G 10/31/2014, 6:52 PM

## 2014-11-02 NOTE — Anesthesia Procedure Notes (Signed)
Spinal Patient location during procedure: OR Staffing Anesthesiologist: Nolon Nations Performed by: anesthesiologist  Preanesthetic Checklist Completed: patient identified, site marked, surgical consent, pre-op evaluation, timeout performed, IV checked, risks and benefits discussed and monitors and equipment checked Spinal Block Patient position: sitting Prep: Betadine Patient monitoring: heart rate, continuous pulse ox and blood pressure Approach: right paramedian Location: L2-3 Injection technique: single-shot Needle Needle type: Quincke  Needle gauge: 22 G Needle length: 9 cm Additional Notes Expiration date of kit checked and confirmed. Patient tolerated procedure well, without complications. Attempt by Margorie Aelyn Stanaland, then [facility]. Very slow CSF flow. Med given.

## 2014-11-02 NOTE — Interval H&P Note (Signed)
OK for surgery PD 

## 2014-11-02 NOTE — Clinical Documentation Improvement (Signed)
11/01/14 Nutrition Eval noted..." Severe malnutrition in context of chronic illness, Underweight"..."INTERVENTION:  Continue 30 ml Prostat daily -provides 100 kcal and 15g of protein Provide Ensure Enlive po BID, each supplement provides 350 kcal and 20 grams of protein Encourage PO intake NUTRITION DIAGNOSIS:  Malnutrition related to chronic illness as evidenced by severe depletion of body fat, severe depletion of muscle mass.".Marland KitchenMarland Kitchen  For accurate Dx specificity & severity can noted nutr eval w/ nutr diagnosis be further validated in progr note for cond being eval'd, mon'd & tx'd. Thank you  Possible Clinical Conditions? Severe Malnutrition   Protein Calorie Malnutrition Severe Protein Calorie Malnutrition Emaciation  Cachexia   Other Condition Cannot clinically determine  Supporting Information: see full nutrition assessment 11/01/14  Thank You, Toribio Harbour, RN, BSN, CCDS Certified Clinical Documentation Specialist Pager: [phone] [place]: Health Information Management

## 2014-11-02 NOTE — Care Management Important Message (Signed)
Important Message  Patient Details  Name: [name] MRN: [id number] Date of Birth: 1917-03-02   Medicare Important Message Given:  Yes-second notification given    Haskell Flirt 11/02/2014, 2:20 PMImportant Message  Patient Details  Name: [name] MRN: [id number] Date of Birth: Aug 02, 1916   Medicare Important Message Given:  Yes-second notification given    Haskell Flirt 11/02/2014, 2:20 PM

## 2014-11-02 NOTE — Care Management Note (Signed)
Case Management Note  Patient Details  Name: [name] MRN: [id number] Date of Birth: 04/14/1916  Subjective/Objective:                   1. Closed left hip fracture Action/Plan:  Discharge planning Expected Discharge Date:                  Expected Discharge Plan:  Skilled Nursing Facility  In-House Referral:  Clinical Social Work  Discharge planning Services  CM Consult  Post Acute Care Choice:    Choice offered to:     DME Arranged:    DME Agency:     HH Arranged:    HH Agency:     Status of Service:  Completed, signed off  Medicare Important Message Given:    Date Medicare IM Given:    Medicare IM give by:    Date Additional Medicare IM Given:    Additional Medicare Important Message give by:     If discussed at Long Length of Stay Meetings, dates discussed:    Additional Comments: CM notes pt to go to SNF; CSW arranging.  No other CM needs were communicated. Yves Dill, RN 11/02/2014, 11:34 AM

## 2014-11-02 NOTE — Transfer of Care (Signed)
Immediate Anesthesia Transfer of Care Note  Patient: [name]  Procedure(s) Performed: Procedure(s): LEFT HIP HEMI ARTHROPLASTY  (HEMIARTHROPLASTY) (Left)  Patient Location: PACU  Anesthesia Type:Spinal  Level of Consciousness:  sedated, patient cooperative and responds to stimulation  Airway & Oxygen Therapy:Patient Spontanous Breathing and Patient connected to face mask oxgen  Post-op Assessment:  Report given to PACU RN and Post -op Vital signs reviewed and stable  Post vital signs:  Reviewed and stable  Last Vitals:  Filed Vitals:   11/02/14 1112  BP: 116/48  Pulse: 84  Temp: 37 C  Resp: 16    Complications: No apparent anesthesia complications

## 2014-11-02 NOTE — Clinical Social Work Placement (Signed)
   CLINICAL SOCIAL WORK PLACEMENT  NOTE  Date:  11/02/2014  Patient Details  Name: DMARCO BALDUS MRN: [id number] Date of Birth: 1916/06/13  Clinical Social Work is seeking post-discharge placement for this patient at the Skilled  Nursing Facility level of care (*CSW will initial, date and re-position this form in  chart as items are completed):  Yes   Patient/family provided with [facility] Social Work Department's list of facilities offering this level of care within the geographic area requested by the patient (or if unable, by the patient's family).  Yes   Patient/family informed of their freedom to choose among providers that offer the needed level of care, that participate in Medicare, Medicaid or managed care program needed by the patient, have an available bed and are willing to accept the patient.  Yes   Patient/family informed of [facility]'s ownership interest in [facility] and [facility], as well as of the fact that they are under no obligation to receive care at these facilities.  PASRR submitted to EDS on       PASRR number received on       Existing PASRR number confirmed on 11/02/14     FL2 transmitted to all facilities in geographic area requested by pt/family on 11/02/14     FL2 transmitted to all facilities within larger geographic area on       Patient informed that his/her managed care company has contracts with or will negotiate with certain facilities, including the following:        Yes   Patient/family informed of bed offers received.  Patient chooses bed at       Physician recommends and patient chooses bed at      Patient to be transferred to   on  .  Patient to be transferred to facility by       Patient family notified on   of transfer.  Name of family member notified:        PHYSICIAN       Additional Comment:    _______________________________________________ Royetta Asal, LCSW [phone] 11/02/2014, 4:05  PM

## 2014-11-03 ENCOUNTER — Encounter (HOSPITAL_COMMUNITY): Payer: Self-pay | Admitting: Orthopaedic Surgery

## 2014-11-03 LAB — TYPE AND SCREEN
ABO/RH(D): O POS
ANTIBODY SCREEN: NEGATIVE
Unit division: 0
Unit division: 0

## 2014-11-03 LAB — BASIC METABOLIC PANEL
Anion gap: 9 (ref 5–15)
BUN: 20 mg/dL (ref 6–20)
CALCIUM: 7.8 mg/dL — AB (ref 8.9–10.3)
CHLORIDE: 105 mmol/L (ref 101–111)
CO2: 21 mmol/L — ABNORMAL LOW (ref 22–32)
CREATININE: 0.99 mg/dL (ref 0.61–1.24)
GFR calc Af Amer: >60 mL/min (ref >60)
Glucose, Bld: 145 mg/dL — ABNORMAL HIGH (ref 65–99)
POTASSIUM: 3.1 mmol/L — AB (ref 3.5–5.1)
Sodium: 135 mmol/L (ref 135–145)

## 2014-11-03 LAB — CBC
HEMATOCRIT: 29 % — AB (ref 39.0–52.0)
HEMOGLOBIN: 10 g/dL — AB (ref 13.0–17.0)
MCH: 32.8 pg (ref 26.0–34.0)
MCHC: 34.5 g/dL (ref 30.0–36.0)
MCV: 95.1 fL (ref 78.0–100.0)
Platelets: 149 10*3/uL — ABNORMAL LOW (ref 150–400)
RBC: 3.05 MIL/uL — AB (ref 4.22–5.81)
RDW: 18 % — ABNORMAL HIGH (ref 11.5–15.5)
WBC: 7.1 10*3/uL (ref 4.0–10.5)

## 2014-11-03 MED ORDER — ALPRAZOLAM 0.25 MG PO TABS: 0.2500 mg | ORAL_TABLET | Freq: Three times a day (TID) | ORAL | Status: DC | PRN

## 2014-11-03 MED ORDER — HYDROCODONE-ACETAMINOPHEN 5-325 MG PO TABS: 1.0000 | ORAL_TABLET | Freq: Four times a day (QID) | ORAL | Status: AC | PRN

## 2014-11-03 MED ORDER — ASPIRIN 325 MG PO TBEC: 325.0000 mg | DELAYED_RELEASE_TABLET | Freq: Two times a day (BID) | ORAL | Status: AC

## 2014-11-03 NOTE — Progress Notes (Signed)
[facility] PROGRESS NOTE  Russell [name] ZOX:[id number] DOB: Aug 10, 1916 DOA: 10/31/2014 PCP: Pcp Not In System   Brief narrative: 81 year old Caucasian male who presented to the hospital after a fall and was found to have closed left hip fracture. Orthopedic surgery subsequently consulted  Assessment/Plan: 1. Closed left hip fracture - Continue supportive therapy - pod # 1, for PT evaluation today - Orthopedic surgery on board  Code Status: Full Family Communication: No family at bedside Disposition Plan: PT to assess and then discharge planning to start. Most likely d/c within the next 1-2 days   Consultants:  Orthopedic surgery  Procedures:  Please refer to notes by orthopedic surgeon  Antibiotics:  None  HPI/Subjective: Patient has no new complaints. No acute issues overnight  Objective: Filed Vitals:   11/03/14 1029  BP: 129/50  Pulse: 78  Temp: 99.2 F (37.3 C)  Resp: 20    Intake/Output Summary (Last 24 hours) at 11/03/14 1414 Last data filed at 11/03/14 1030  Gross per 24 hour  Intake   1950 ml  Output    700 ml  Net   1250 ml   Filed Weights   10/31/14 1600  Weight: 56.7 kg (125 lb)    Exam:   General:  Patient in no acute distress, alert and awake  Cardiovascular: Regular rate and rhythm, no murmurs or rubs  Respiratory: Clear to auscultation bilaterally, no wheezes  Abdomen: Soft, nondistended, nontender  Musculoskeletal: No cyanosis or clubbing   Data Reviewed: Basic Metabolic Panel:  Recent Labs Lab 10/31/14 1040 11/01/14 0414 11/02/14 0506 11/03/14 0435  NA 136 135 136 135  K 3.9 3.4* 3.2* 3.1*  CL 102 103 106 105  CO2 24 24 22  21*  GLUCOSE 100* 131* 124* 145*  BUN 17 21* 18 20  CREATININE 1.16 1.16 1.08 0.99  CALCIUM 8.8* 8.1* 7.8* 7.8*   Liver Function Tests:  Recent Labs Lab 10/31/14 1040  AST 21  ALT 13*  ALKPHOS 47  BILITOT 0.3  PROT 7.0  ALBUMIN 3.6   No results for input(s): LIPASE, AMYLASE  in the last 168 hours. No results for input(s): AMMONIA in the last 168 hours. CBC:  Recent Labs Lab 10/31/14 1040 11/01/14 0414 11/03/14 0435  WBC 4.8 6.0 7.1  HGB 9.8* 8.2* 10.0*  HCT 29.5* 25.1* 29.0*  MCV 98.7 98.0 95.1  PLT 261 205 149*   Cardiac Enzymes: No results for input(s): CKTOTAL, CKMB, CKMBINDEX, TROPONINI in the last 168 hours. BNP (last 3 results) No results for input(s): BNP in the last 8760 hours.  ProBNP (last 3 results) No results for input(s): PROBNP in the last 8760 hours.  CBG:  Recent Labs Lab 11/02/14 1656  GLUCAP 113*    Recent Results (from the past 240 hour(s))  Surgical pcr screen     Status: Abnormal   Collection Time: 11/01/14  6:45 AM  Result Value Ref Range Status   MRSA, PCR POSITIVE (A) NEGATIVE Final   Staphylococcus aureus POSITIVE (A) NEGATIVE Final    Comment:        The Xpert SA Assay (FDA approved for NASAL specimens in patients over 3 years of age), is one component of a comprehensive surveillance program.  Test performance has been validated by [facility] for patients greater than or equal to 67 year old. It is not intended to diagnose infection nor to guide or monitor treatment. CRITICAL RESULT CALLED TO, READ BACK BY AND VERIFIED WITH: S.WHITTMORE AT 8119 ON 11/01/14 BY S.VANHOORNE  Studies: Pelvis Portable  11/02/2014   CLINICAL DATA:  Status post left hip arthroplasty.  EXAM: PORTABLE PELVIS 1-2 VIEWS  COMPARISON:  03/17/2014  FINDINGS: Interval left hip and be arthroplasty. The hardware components are in anatomic alignment and no complications noted. Gas is identified within the surrounding soft tissues. Stable appearance of right hip arthroplasty device.  IMPRESSION: 1. No complications after left hip he hemi arthroplasty.   Electronically Signed   By: Signa Kell M.D.   On: 11/02/2014 14:27   Dg Chest Port 1 View  11/01/2014   CLINICAL DATA:  Cough and fever, possible aspiration.  EXAM: PORTABLE CHEST  - 1 VIEW  COMPARISON:  10/31/2014  FINDINGS: Patient slightly rotated to the right. There is worsening interstitial prominence bilaterally with new mixed interstitial airspace density over the lung bases suggesting infection. No evidence effusion or pneumothorax. Cardiomediastinal silhouette and remainder of the exam is unchanged.  IMPRESSION: New mild bilateral interstitial prominence with bibasilar mixed interstitial airspace density suggesting atelectasis or infection.   Electronically Signed   By: Elberta Fortis M.D.   On: 11/01/2014 21:07    Scheduled Meds: . sodium chloride   Intravenous Once  . aspirin EC  325 mg Oral BID AC & HS  . chlorhexidine   Topical Once  . Chlorhexidine Gluconate Cloth  6 each Topical Q0600  . divalproex  125 mg Oral QHS  . feeding supplement (ENSURE ENLIVE)  237 mL Oral BID BM  . feeding supplement (PRO-STAT SUGAR FREE 64)  30 mL Oral Daily  . ferrous sulfate  325 mg Oral TID PC  . finasteride  5 mg Oral QHS  . heparin  5,000 Units Subcutaneous 3 times per day  . multivitamins with iron  1 tablet Oral QPM  . mupirocin ointment  1 application Nasal BID  . senna-docusate  1 tablet Oral QPM  . sertraline  37.5 mg Oral QHS  . terazosin  5 mg Oral QHS   Continuous Infusions: . sodium chloride 75 mL/hr at 11/01/14 0224    Principal Problem:   Closed left hip fracture Active Problems:   Chronic kidney disease   Hip fracture   Dementia    Time spent: More than 25 minutes    [name]  [facility] Pager [phone]. If 7PM-7AM, please contact night-coverage at [email or web address], password [facility] 11/03/2014, 2:14 PM  LOS: 3 days

## 2014-11-03 NOTE — Progress Notes (Signed)
Subjective: 1 Day Post-Op Procedure(s) (LRB): LEFT HIP HEMI ARTHROPLASTY  (HEMIARTHROPLASTY) (Left)   Patient resting comfortably in bed this morning.  Activity level:  wbat with posterior hip precautuions Diet tolerance:  ok Voiding:  Foley in place Patient reports pain as mild.    Objective: Vital signs in last 24 hours: Temp:  [97.2 F (36.2 C)-99.1 F (37.3 C)] 99.1 F (37.3 C) (07/27 0443) Pulse Rate:  [52-84] 68 (07/27 0443) Resp:  [14-20] 18 (07/27 0443) BP: (106-161)/(30-131) 153/65 mmHg (07/27 0443) SpO2:  [92 %-100 %] 93 % (07/27 0443)  Labs:  Recent Labs  10/31/14 1040 11/01/14 0414 11/03/14 0435  HGB 9.8* 8.2* 10.0*    Recent Labs  11/01/14 0414 11/03/14 0435  WBC 6.0 7.1  RBC 2.56* 3.05*  HCT 25.1* 29.0*  PLT 205 149*    Recent Labs  11/02/14 0506 11/03/14 0435  NA 136 135  K 3.2* 3.1*  CL 106 105  CO2 22 21*  BUN 18 20  CREATININE 1.08 0.99  GLUCOSE 124* 145*  CALCIUM 7.8* 7.8*   No results for input(s): LABPT, INR in the last 72 hours.  Physical Exam:  Neurologically intact ABD soft Neurovascular intact Sensation intact distally Intact pulses distally Dorsiflexion/Plantar flexion intact Incision: dressing C/D/I and scant drainage No cellulitis present Compartment soft  Assessment/Plan:  1 Day Post-Op Procedure(s) (LRB): LEFT HIP HEMI ARTHROPLASTY  (HEMIARTHROPLASTY) (Left) Advance diet Up with therapy Discharge to SNF [facility] most likely once cleared by medical team and PT. Continue on ASA  BID x 4 weeks post op for DVT prevention. Continue WBAT with posterior hip precautions. Continue hydrocodone for pain but will monitor mental status closely for changes from baseline. Follow up in office 2 weeks post op.   Russell [name], Russell [name] 11/03/2014, 8:12 AM

## 2014-11-03 NOTE — Evaluation (Signed)
Physical Therapy Evaluation Patient Details Name: Russell [name] MRN: [id number] DOB: 08/05/1916 Today's Date: 11/03/2014   History of Present Illness  Pt is 81 year old s/p L hip hemiarthroplasty. Pt with PMH of stroke, back surgery and R hemiarthoplasty in 2014.  Clinical Impression  Pt s/p L hip fx with hemi-arthroplasty repair presents with functional mobility limitations 2* generalized weakness Bil LEs, post op pain and posterior THP.  Pt will benefit from follow up rehab at SNF level.    Follow Up Recommendations SNF    Equipment Recommendations  None recommended by PT    Recommendations for Other Services OT consult     Precautions / Restrictions Precautions Precautions: Fall;Posterior Hip Precaution Booklet Issued: Yes (comment) Precaution Comments: Sign hung in room Restrictions Weight Bearing Restrictions: No LLE Weight Bearing: Weight bearing as tolerated      Mobility  Bed Mobility Overal bed mobility: Needs Assistance Bed Mobility: Supine to Sit     Supine to sit: +2 for physical assistance;Max assist     General bed mobility comments: assist for trunk to upright and LEs over to EOB. Used pad to scoot hip around to EOB.   Transfers Overall transfer level: Needs assistance Equipment used: Rolling walker (2 wheeled) Transfers: Sit to/from [employer] Sit to Stand: +2 physical assistance;Max assist   Squat pivot transfers: +2 physical assistance;Max assist     General transfer comment: Stood from EOB with +2 assist but pt not able to maintain and reports LEs are "weak." Sat back down on EOB. Pivot transfer to recliner without device with +2 max assist.   Ambulation/Gait                Stairs            Wheelchair Mobility    Modified Rankin (Stroke Patients Only)       Balance Overall balance assessment: Needs assistance Sitting-balance support: Bilateral upper extremity supported Sitting balance-Leahy Scale:  Poor     Standing balance support: Bilateral upper extremity supported Standing balance-Leahy Scale: Zero                               Pertinent Vitals/Pain Pain Assessment: Faces Faces Pain Scale: Hurts a little bit Pain Location: L hip Pain Descriptors / Indicators: Aching Pain Intervention(s): Limited activity within patient's tolerance;Monitored during session;Premedicated before session    Home Living Family/patient expects to be discharged to:: Skilled nursing facility                 Additional Comments: resides at [facility]    Prior Function Level of Independence: Needs assistance   Gait / Transfers Assistance Needed: pt reports I transfers and I w/c mobility - pt states has been non-ambulatory x 2-3 yrs  ADL's / Homemaking Assistance Needed: pt reports having assist with bathing at ALF. he states he could dress himself. States he only did pivot to wheelchair PTA.        Hand Dominance   Dominant Hand: Right    Extremity/Trunk Assessment   Upper Extremity Assessment: Defer to OT evaluation           Lower Extremity Assessment: Generalized weakness      Cervical / Trunk Assessment: Kyphotic  Communication   Communication: HOH  Cognition Arousal/Alertness: Lethargic;Suspect due to medications Behavior During Therapy: [facility] for tasks assessed/performed Overall Cognitive Status: No family/caregiver present to determine baseline cognitive functioning  General Comments General comments (skin integrity, edema, etc.): mod assist initially sitting EOb to correct balance to midline. Min assist to maintain.    Exercises Total Joint Exercises Ankle Circles/Pumps: AAROM;AROM;10 reps;Supine;Both Heel Slides: AAROM;Left;10 reps;Supine      Assessment/Plan    PT Assessment Patient needs continued PT services  PT Diagnosis Difficulty walking   PT Problem List Decreased strength;Decreased range of  motion;Decreased activity tolerance;Decreased balance;Decreased mobility;Decreased cognition;Decreased knowledge of use of DME;Pain;Decreased knowledge of precautions;Decreased safety awareness  PT Treatment Interventions DME instruction;Gait training;Stair training;Functional mobility training;Therapeutic activities;Therapeutic exercise;Patient/family education   PT Goals (Current goals can be found in the Care Plan section) Acute Rehab PT Goals Patient Stated Goal: pt agreeable to OOB with therapy PT Goal Formulation: With patient Time For Goal Achievement: 11/09/14 Potential to Achieve Goals: Fair    Frequency Min 3X/week   Barriers to discharge        Co-evaluation PT/OT/SLP Co-Evaluation/Treatment: Yes Reason for Co-Treatment: For patient/therapist safety PT goals addressed during session: Mobility/safety with mobility OT goals addressed during session: ADL's and self-care       End of Session Equipment Utilized During Treatment: Gait belt Activity Tolerance: Patient limited by lethargy;Patient limited by fatigue Patient left: in chair;with call bell/phone within reach Nurse Communication: Mobility status         Time: 1610-9604 PT Time Calculation (min) (ACUTE ONLY): 36 min   Charges:   PT Evaluation $Initial PT Evaluation Tier I: 1 Procedure     PT G Codes:        Lauriel Helin 16-Nov-2014, 12:57 PM

## 2014-11-03 NOTE — Anesthesia Postprocedure Evaluation (Signed)
Anesthesia Post Note  Patient: Russell [name]  Procedure(s) Performed: Procedure(s) (LRB): LEFT HIP HEMI ARTHROPLASTY  (HEMIARTHROPLASTY) (Left)  Anesthesia type: General  Patient location: PACU  Post pain: Pain level controlled  Post assessment: Post-op Vital signs reviewed  Last Vitals: BP 153/65 mmHg  Pulse 68  Temp(Src) 37.3 C (Oral)  Resp 18  Ht  (1.854 m)  Wt 125 lb (56.7 kg)  BMI 16.50 kg/m2  SpO2 93%  Post vital signs: Reviewed  Level of consciousness: sedated  Complications: No apparent anesthesia complications

## 2014-11-03 NOTE — Evaluation (Signed)
Clinical/Bedside Swallow Evaluation Patient Details  Name: Russell [name] MRN: [id number] Date of Birth: 11-02-1916  Today's Date: 11/03/2014 Time: SLP Start Time (ACUTE ONLY): 1525 SLP Stop Time (ACUTE ONLY): 1559 SLP Time Calculation (min) (ACUTE ONLY): 34 min  Past Medical History:  Past Medical History  Diagnosis Date  . Stroke   . Acute bronchitis   . Hypertension    Past Surgical History:  Past Surgical History  Procedure Laterality Date  . Hernia repair    . Lumbar disc surgery    . Hip arthroplasty Right 08/26/2012    Procedure: RIGHT HEMI HIP ARTHROPLASTY ;  Surgeon: Velna Ochs, MD;  Location: [facility]S;  Service: Orthopedics;  Laterality: Right;  . Joint replacement      right total hip  . Back surgery      lumbar disc surgery  . Hip arthroplasty Left 11/02/2014    Procedure: LEFT HIP HEMI ARTHROPLASTY  (HEMIARTHROPLASTY);  Surgeon: Marcene Corning, MD;  Location: [facility];  Service: Orthopedics;  Laterality: Left;   HPI:  Pt is 81 year old s/p L hip hemiarthroplasty. Pt with PMH of stroke, back surgery and R hemiarthoplasty in 2014.Pt had MBS completed earlier this month recommending Dys 3 diet and nectar thick liquids with known aspiration risk.   Assessment / Plan / Recommendation Clinical Impression  Pt has prolonged mastication and bolus formation with reduced posterior transit despite Mod cues from therapist. Suspect this is likely due to cognitive status as well as lack of dentition. Pt tries to take additional POs before boluses are cleared from his oral cavity, resulting in immediate coughing and concern for airway compromise. No overt coughing is noted with thin liquids, however there is a wet vocal quality and MBS from earlier this month recommended nectar thick liquids. Will adjust diet to Dys 1 to facilitate oral phase, along with nectar thick liquids. Pt requires full supervision due to current mentation. SLP to f/u for tolerance.    Aspiration Risk  Moderate    Diet Recommendation Dysphagia 1 (Puree);Nectar   Medication Administration: Whole meds with puree Compensations: Slow rate;Small sips/bites;Follow solids with liquid    Other  Recommendations Oral Care Recommendations: Oral care BID Other Recommendations: Order thickener from pharmacy;Prohibited food (jello, ice cream, thin soups);Remove water pitcher   Follow Up Recommendations       Frequency and Duration min 2x/week  2 weeks   Pertinent Vitals/Pain n/a    SLP Swallow Goals     Swallow Study Prior Functional Status       General Other Pertinent Information: Pt is 81 year old s/p L hip hemiarthroplasty. Pt with PMH of stroke, back surgery and R hemiarthoplasty in 2014.Pt had MBS completed earlier this month recommending Dys 3 diet and nectar thick liquids with known aspiration risk. Type of Study: Bedside swallow evaluation Previous Swallow Assessment: see HPI Diet Prior to this Study: Regular;Thin liquids Temperature Spikes Noted:  (99.1) Respiratory Status: Room air History of Recent Intubation: No Behavior/Cognition: Alert;Cooperative;Confused;Requires cueing Oral Cavity - Dentition: Edentulous (dentures do not fit per RN) Self-Feeding Abilities: Able to feed self;Needs assist Patient Positioning: Upright in bed Baseline Vocal Quality: Normal    Oral/Motor/Sensory Function     Ice Chips Ice chips: Not tested   Thin Liquid Thin Liquid: Impaired Presentation: Self Fed;Straw Pharyngeal  Phase Impairments: Wet Vocal Quality    Nectar Thick Nectar Thick Liquid: Impaired Presentation: Self Fed;Straw Pharyngeal Phase Impairments: Suspected delayed Swallow   Honey Thick Honey Thick Liquid: Not  tested   Puree Puree: Not tested   Solid    Solid: Impaired Presentation: Self Fed;Spoon Oral Phase Impairments: Poor awareness of bolus;Impaired mastication;Impaired anterior to posterior transit Pharyngeal Phase Impairments: Cough - Immediate      Maxcine Ham, M.A. CCC-SLP [phone]  Maxcine Ham 11/03/2014,4:08 PM

## 2014-11-03 NOTE — Evaluation (Addendum)
Occupational Therapy Evaluation Patient Details Name: Russell [name] MRN: [id number] DOB: 1916/05/23 Today's Date: 11/03/2014    History of Present Illness Pt is 81 year old s/p L hip hemiarthroplasty. Pt with PMH of stroke, back surgery and R hemiarthoplasty in 2014.   Clinical Impression   Pt currently requires +2 max to total assist for bed mobility and ADL. Recommend SNF at d/c. Will follow on acute to progress ADL independence for next venue.    Follow Up Recommendations  SNF;Supervision/Assistance - 24 hour    Equipment Recommendations  Other (comment) (defer to next venue)    Recommendations for Other Services       Precautions / Restrictions Precautions Precautions: Fall;Posterior Hip Restrictions Weight Bearing Restrictions: No LLE Weight Bearing: Weight bearing as tolerated      Mobility Bed Mobility Overal bed mobility: Needs Assistance Bed Mobility: Supine to Sit     Supine to sit: +2 for physical assistance;Max assist     General bed mobility comments: assist for trunk to upright and LEs over to EOB. Used pad to scoot hip around to EOB.   Transfers Overall transfer level: Needs assistance Equipment used: Rolling walker (2 wheeled) Transfers: Sit to/from [employer] Sit to Stand: +2 physical assistance;Max assist   Squat pivot transfers: +2 physical assistance;Max assist     General transfer comment: Stood from EOB with +2 assist but pt not able to maintain and reports LEs are "weak." Sat back down on EOB. Pivot transfer to recliner without device with +2 max assist.     Balance                                            ADL Overall ADL's : Needs assistance/impaired Eating/Feeding: Set up;Sitting   Grooming: Wash/dry hands;Set up;Bed level   Upper Body Bathing: Bed level;Maximal assistance   Lower Body Bathing: +2 for physical assistance;Total assistance;Sit to/from stand   Upper Body Dressing : Total  assistance;Sitting   Lower Body Dressing: Total assistance;+2 for physical assistance;Sit to/from stand   Toilet Transfer: +2 for physical assistance;Squat-pivot;Maximal assistance             General ADL Comments: Pt with strong lean to the R in sitting and needed assist to correct. Pt drowsy during session especially when not mobilizing. O2 sats at EOB 90%. rechecked after transfer to recliner and 89% and nursing requested to put pt on 2L. sats up to 93% on 2L. Pt complaining of some lightheadedness at EOB. Attempted to stand with walker in front from EOB but pt not able to hold himself up in standing and reportin LEs feeling very weak so sat back down. Required +2 assist for squat pivot tranfer to recliner..      Vision     Perception     Praxis      Pertinent Vitals/Pain Pain Assessment: Faces Faces Pain Scale: Hurts a little bit Pain Location: with repositioning in chair. L hip. Pain Descriptors / Indicators: Moaning Pain Intervention(s): Repositioned     Hand Dominance Right   Extremity/Trunk Assessment Upper Extremity Assessment Upper Extremity Assessment: Generalized weakness           Communication Communication Communication: HOH   Cognition Arousal/Alertness: Lethargic;Suspect due to medications (able to arouse during session when mobilizing but sleepy/drowsy when not moving around) Behavior During Therapy: [facility] for tasks assessed/performed Overall Cognitive Status: No  family/caregiver present to determine baseline cognitive functioning                     General Comments       Exercises       Shoulder Instructions      Home Living Family/patient expects to be discharged to:: Skilled nursing facility                                 Additional Comments: resides at [facility]      Prior Functioning/Environment Level of Independence: Needs assistance    ADL's / Homemaking Assistance Needed: pt reports having assist  with bathing at ALF. he states he could dress himself. States he only did pivot to wheelchair PTA.        OT Diagnosis: Generalized weakness   OT Problem List: Decreased strength;Decreased knowledge of use of DME or AE   OT Treatment/Interventions: Self-care/ADL training;Patient/family education;Therapeutic activities;DME and/or AE instruction    OT Goals(Current goals can be found in the care plan section) Acute Rehab OT Goals Patient Stated Goal: pt agreeable to OOB with therapy OT Goal Formulation: With patient Time For Goal Achievement: 11/10/14 Potential to Achieve Goals: Good  OT Frequency: Min 2X/week   Barriers to D/C:            Co-evaluation              End of Session Equipment Utilized During Treatment: Gait belt;Rolling walker;Oxygen  Activity Tolerance: Patient limited by fatigue Patient left: in chair;with call bell/phone within reach;with chair alarm set   Time: 1610-9604 OT Time Calculation (min): 35 min Charges:  OT General Charges $OT Visit: 1 Procedure OT Evaluation $Initial OT Evaluation Tier I: 1 Procedure G-Codes:    Lennox Laity  [phone] 11/03/2014, 12:06 PM

## 2014-11-03 NOTE — Progress Notes (Signed)
Physical Therapy Treatment Patient Details Name: Russell [name] MRN: [id number] DOB: [birth year] Today's Date: 11/03/2014    History of Present Illness Pt is 81 year old s/p L hip hemiarthroplasty. Pt with PMH of stroke, back surgery and R hemiarthoplasty in 2014.    PT Comments    Pt with increased confusion/agitation this pm and with increased difficulty following commands for safe participation with PT.  Required use of mechanical lift to safely return pt to bed.  Follow Up Recommendations  SNF     Equipment Recommendations  None recommended by PT    Recommendations for Other Services       Precautions / Restrictions Precautions Precautions: Fall;Posterior Hip Precaution Booklet Issued: Yes (comment) Precaution Comments: Sign hung in room Restrictions Weight Bearing Restrictions: No LLE Weight Bearing: Weight bearing as tolerated    Mobility  Bed Mobility Overal bed mobility: Needs Assistance;+2 for physical assistance;+ 2 for safety/equipment Bed Mobility: Sit to Supine       Sit to supine: +2 for physical assistance;+2 for safety/equipment   General bed mobility comments: Assist with Bil LEs and to control trunk  Transfers Overall transfer level: Needs assistance Equipment used: Rolling walker (2 wheeled)             General transfer comment: Pt assisted to edge of chair.  Pt unable to stand from chair and unable to assist sufficiently to perform squat/stand pvt.  For pt/staff saftey, utilized [employer] to complete transfer  Ambulation/Gait                 Information systems manager [employer] (Stroke Patients Only)       Balance                                    Cognition Arousal/Alertness: Awake/alert Behavior During Therapy: Impulsive;Agitated Overall Cognitive Status: No family/caregiver present to determine baseline cognitive functioning                      Exercises       General Comments        Pertinent Vitals/Pain Pain Assessment: Faces Faces Pain Scale: Hurts even more Pain Location: L hip Pain Descriptors / Indicators: Moaning;Grimacing Pain Intervention(s): Limited activity within patient's tolerance;Monitored during session;Ice applied    Home Living                      Prior Function            PT Goals (current goals can now be found in the care plan section) Acute Rehab PT Goals PT Goal Formulation: Patient unable to participate in goal setting Time For Goal Achievement: 11/09/14 Potential to Achieve Goals: Fair Progress towards PT goals: Not progressing toward goals - comment (Increased confusion, decreased following cues)    Frequency  Min 3X/week    PT Plan Current plan remains appropriate    Co-evaluation             End of Session   Activity Tolerance: Patient limited by pain;Other (comment) (Increased confusion) Patient left: in bed;with call bell/phone within reach;with nursing/sitter in room     Time: 1435-1500 PT Time Calculation (min) (ACUTE ONLY): 25 min  Charges:  $Therapeutic Activity: 23-37 mins  G Codes:      Ashely Goosby 2014-11-04, 4:47 PM

## 2014-11-04 DIAGNOSIS — S72002S Fracture of unspecified part of neck of left femur, sequela: Secondary | ICD-10-CM

## 2014-11-04 DIAGNOSIS — W1839XA Other fall on same level, initial encounter: Secondary | ICD-10-CM

## 2014-11-04 DIAGNOSIS — F0390 Unspecified dementia without behavioral disturbance: Secondary | ICD-10-CM

## 2014-11-04 DIAGNOSIS — W010XXA Fall on same level from slipping, tripping and stumbling without subsequent striking against object, initial encounter: Secondary | ICD-10-CM | POA: Insufficient documentation

## 2014-11-04 DIAGNOSIS — E876 Hypokalemia: Secondary | ICD-10-CM

## 2014-11-04 LAB — BASIC METABOLIC PANEL
Anion gap: 7 (ref 5–15)
BUN: 23 mg/dL — ABNORMAL HIGH (ref 6–20)
CO2: 22 mmol/L (ref 22–32)
Calcium: 7.5 mg/dL — ABNORMAL LOW (ref 8.9–10.3)
Chloride: 106 mmol/L (ref 101–111)
Creatinine, Ser: 1.02 mg/dL (ref 0.61–1.24)
Glucose, Bld: 125 mg/dL — ABNORMAL HIGH (ref 65–99)
POTASSIUM: 2.8 mmol/L — AB (ref 3.5–5.1)
SODIUM: 135 mmol/L (ref 135–145)

## 2014-11-04 LAB — CBC
HEMATOCRIT: 24.2 % — AB (ref 39.0–52.0)
Hemoglobin: 8.2 g/dL — ABNORMAL LOW (ref 13.0–17.0)
MCH: 32 pg (ref 26.0–34.0)
MCHC: 33.9 g/dL (ref 30.0–36.0)
MCV: 94.5 fL (ref 78.0–100.0)
Platelets: 148 10*3/uL — ABNORMAL LOW (ref 150–400)
RBC: 2.56 MIL/uL — AB (ref 4.22–5.81)
RDW: 17.6 % — AB (ref 11.5–15.5)
WBC: 7.9 10*3/uL (ref 4.0–10.5)

## 2014-11-04 MED ORDER — POTASSIUM CHLORIDE CRYS ER 20 MEQ PO TBCR
40.0000 meq | EXTENDED_RELEASE_TABLET | Freq: Once | ORAL | Status: AC
  Administered 2014-11-04: 40 meq via ORAL
  Filled 2014-11-04: qty 2

## 2014-11-04 MED ORDER — ENSURE ENLIVE PO LIQD: 237.0000 mL | Freq: Two times a day (BID) | ORAL | Status: DC

## 2014-11-04 MED ORDER — FERROUS SULFATE 325 (65 FE) MG PO TABS: 325.0000 mg | ORAL_TABLET | Freq: Two times a day (BID) | ORAL | Status: AC

## 2014-11-04 MED ORDER — POTASSIUM CHLORIDE ER 10 MEQ PO TBCR: 10.0000 meq | EXTENDED_RELEASE_TABLET | Freq: Every day | ORAL | Status: AC

## 2014-11-04 NOTE — Progress Notes (Signed)
Subjective: 2 Days Post-Op Procedure(s) (LRB): LEFT HIP HEMI ARTHROPLASTY  (HEMIARTHROPLASTY) (Left)   Patient resting comfortably in bed. His bandage was changed this morning due to some bleeding through. It was clean and dry yesterday so they believe that the patient may have been picking at it. He does have rather significant dementia so this is likely the case.  Activity level:  wbat Diet tolerance:  ok Voiding:  ok Patient reports pain as mild.    Objective: Vital signs in last 24 hours: Temp:  [98.2 F (36.8 C)-99.9 F (37.7 C)] 99 F (37.2 C) (07/28 0526) Pulse Rate:  [76-79] 76 (07/28 0526) Resp:  [18-26] 24 (07/28 0526) BP: (101-153)/(50-62) 153/60 mmHg (07/28 0526) SpO2:  [90 %-94 %] 90 % (07/28 0526)  Labs:  Recent Labs  11/03/14 0435 11/04/14 0645  HGB 10.0* 8.2*    Recent Labs  11/03/14 0435 11/04/14 0645  WBC 7.1 7.9  RBC 3.05* 2.56*  HCT 29.0* 24.2*  PLT 149* 148*    Recent Labs  11/03/14 0435 11/04/14 0645  NA 135 135  K 3.1* 2.8*  CL 105 106  CO2 21* 22  BUN 20 23*  CREATININE 0.99 1.02  GLUCOSE 145* 125*  CALCIUM 7.8* 7.5*   No results for input(s): LABPT, INR in the last 72 hours.  Physical Exam:  Neurologically intact ABD soft Neurovascular intact Sensation intact distally Intact pulses distally Dorsiflexion/Plantar flexion intact Incision: dressing C/D/I and Dressing is saturated with blood. it was removed. Ther was a slow  trickle of blood from the proximal end of the incision. Steri strips were placed over the end of this over the final staple to help stop the slow bleeding. No cellulitis present Compartment soft  Assessment/Plan:  2 Days Post-Op Procedure(s) (LRB): LEFT HIP HEMI ARTHROPLASTY  (HEMIARTHROPLASTY) (Left) Up with therapy Discharge to SNF hopefully today if cleared by medicine. He will need daily dressing checks at SNF but is stable to go from an orthopaedic standpoint. Continue on ASA  BID x 4 weeks  post op for DVT prevention. Continue WBAT with posterior hip precautions. Continue hydrocodone for pain but will monitor mental status closely for changes from baseline. Follow up in office 2 weeks post op.  Russell [name], Russell [name] 11/04/2014, 11:56 AM

## 2014-11-04 NOTE — Discharge Instructions (Signed)
Nutrition Post Hospital Stay °Proper nutrition can help your body recover from illness and injury.   °Foods and beverages high in protein, vitamins, and minerals help rebuild muscle loss, promote healing, & reduce fall risk.  ° °•In addition to eating healthy foods, a nutrition shake is an easy, delicious way to get the nutrition you need during and after your hospital stay ° °It is recommended that you continue to drink 2 bottles per day of:       Ensure Enlive for at least 1 month (30 days) after your hospital stay  ° °Tips for adding a nutrition shake into your routine: °As allowed, drink one with vitamins or medications instead of water or juice °Enjoy one as a tasty mid-morning or afternoon snack °Drink cold or make a milkshake out of it °Drink one instead of milk with cereal or snacks °Use as a coffee creamer °  °Available at the following grocery stores and pharmacies:           °* [employer] * [employer] * [employer]  °* R[employer] Aid          * Walmart * [employer]  °* Walgreens      * Target  * BJ's   °* CVS  * [employer]   °* [facility] [phone]  °          °For COUPONS visit: www.ensure.com/join or www.boost.com/members/sign-up  ° °Suggested Substitutions °Ensure Plus = Boost Plus = Carnation Breakfast Essentials = Boost Compact °Ensure Active Clear = Boost Breeze °Glucerna Shake = Boost Glucose Control = Carnation Breakfast Essentials SUGAR FREE ° °  ° °

## 2014-11-04 NOTE — Clinical Social Work Placement (Signed)
   CLINICAL SOCIAL WORK PLACEMENT  NOTE  Date:  11/04/2014  Patient Details  Name: Russell [name] MRN: [id number] Date of Birth: Jan 12, 1917  Clinical Social Work is seeking post-discharge placement for this patient at the Skilled  Nursing Facility level of care (*CSW will initial, date and re-position this form in  chart as items are completed):  Yes   Patient/family provided with [facility] Clinical Social Work Department's list of facilities offering this level of care within the geographic area requested by the patient (or if unable, by the patient's family).  Yes   Patient/family informed of their freedom to choose among providers that offer the needed level of care, that participate in Medicare, Medicaid or managed care program needed by the patient, have an available bed and are willing to accept the patient.  Yes   Patient/family informed of [facility] ownership interest in [facility] and [facility], as well as of the fact that they are under no obligation to receive care at these facilities.  PASRR submitted to EDS on       PASRR number received on       Existing PASRR number confirmed on 11/02/14     FL2 transmitted to all facilities in geographic area requested by pt/family on 11/02/14     FL2 transmitted to all facilities within larger geographic area on       Patient informed that his/her managed care company has contracts with or will negotiate with certain facilities, including the following:        Yes   Patient/family informed of bed offers received.  Patient chooses bed at [facility]     Physician recommends and patient chooses bed at      Patient to be transferred to [facility] on 11/04/14.  Patient to be transferred to facility by [facility]     Patient family notified on 11/04/14 of transfer.  Name of family member notified:  SON     PHYSICIAN       Additional Comment: Pt / son are in agreement with d/c to [facility] today. [facility]  transport is required. Son is aware out of pocket costs may be associated with [facility] transport. NSG reviewed d/c summary, scripts, avs. Scripts included in d/c packet.   _______________________________________________ Royetta Asal, LCSW  [phone] 11/04/2014, 2:49 PM

## 2014-11-04 NOTE — Discharge Summary (Signed)
Physician Discharge Summary  Russell [name] ZOX:[id number] DOB: 07-04-1916 DOA: 10/31/2014  PCP: Pcp Not In System - pt is from SNF  Admit date: 10/31/2014 Discharge date: 11/04/2014  Recommendations for Outpatient Follow-up:  1. Please note that we've provided prescription for potassium supplementation for next 7 days. His potassium was supplemented prior to discharge. Please recheck potassium level to make sure it is within normal limits.  Discharge Diagnoses:  Principal Problem:   Closed left hip fracture Active Problems:   Chronic kidney disease   Hip fracture   Dementia    Discharge Condition: stable   Diet recommendation: as tolerated dysphasia 1  History of present illness:  81 year old male with history of dementia, BPH, anemia who presented to [facility] long hospital status post fall. Patient was found to have closed left hip fracture. Patient underwent left hip hemiarthroplasty 11/02/2014.  Hospital Course:   Assessment/Plan:  Closed left hip fracture - Status post fall - Status post left him fracture and left hemiarthroplasty on 11/02/2014 - Appreciate orthopedic surgery following and their recommendations   Iron deficiency anemia - Continue ferrous sulfate supplementation  BPH - Continue Proscar and Terazosin   Severe protein calorie malnutrition / failure to thrive in adult - Secondary to history of dementia, chronic illness - Patient was seen and evaluated by SLP, recommended dysphagia 1 diet - Continue nutritional supplementation  Hypokalemia - Likely because of poor by mouth intake - Supplemented prior to discharge and patient will continue potassium supplementation for 7 days on discharge    Code Status: Full Family Communication: No family at bedside   Consultants:  Orthopedic surgery  Antibiotics:  None   Signed:  Manson Passey, MD  [facility] 11/04/2014, 11:37 AM  Pager #: [phone]  Time spent in minutes: more than 30  minutes   Discharge Exam: Filed Vitals:   11/04/14 0526  BP: 153/60  Pulse: 76  Temp: 99 F (37.2 C)  Resp: 24   Filed Vitals:   11/03/14 1120 11/03/14 1428 11/03/14 2118 11/04/14 0526  BP:  101/62 120/50 153/60  Pulse:  79 77 76  Temp:  98.2 F (36.8 C) 99.9 F (37.7 C) 99 F (37.2 C)  TempSrc:  Oral Axillary Axillary  Resp:  18 26 24   Height:      Weight:      SpO2: 89% 94% 93% 90%    General: Pt is alert, follows commands appropriately, not in acute distress Cardiovascular: Regular rate and rhythm, S1/S2 +, no murmurs Respiratory: Clear to auscultation bilaterally, no wheezing, no crackles, no rhonchi Abdominal: Soft, non tender, non distended, bowel sounds +, no guarding Extremities: no edema, no cyanosis, pulses palpable bilaterally DP and PT Neuro: Grossly nonfocal  Discharge Instructions  Discharge Instructions    Call MD for:  difficulty breathing, headache or visual disturbances    Complete by:  As directed      Call MD for:  persistant nausea and vomiting    Complete by:  As directed      Call MD for:  severe uncontrolled pain    Complete by:  As directed      Diet - low sodium heart healthy    Complete by:  As directed      Increase activity slowly    Complete by:  As directed      Weight bearing as tolerated    Complete by:  As directed   Posterior hip precautions with knee immobilizer on while in bed.  Medication List    STOP taking these medications        ALPRAZolam 0.25 MG tablet  Commonly known as:  XANAX     levofloxacin 500 MG tablet  Commonly known as:  LEVAQUIN     metroNIDAZOLE 500 MG tablet  Commonly known as:  FLAGYL     traMADol 50 MG tablet  Commonly known as:  ULTRAM      TAKE these medications        aspirin 325 MG EC tablet  Take 1 tablet (325 mg total) by mouth 2 (two) times daily at 8 am and 10 pm.     divalproex 125 MG capsule  Commonly known as:  DEPAKOTE SPRINKLE  Take 125 mg by mouth at bedtime.      feeding supplement (ENSURE ENLIVE) Liqd  Take 237 mLs by mouth 2 (two) times daily between meals.     ENSURE PLUS Liqd  Take 237 mLs by mouth 2 (two) times daily.     feeding supplement (PRO-STAT SUGAR FREE 64) Liqd  Take 30 mLs by mouth daily. Once daily according to [facility]     ferrous sulfate 325 (65 FE) MG tablet  Take 1 tablet (325 mg total) by mouth 2 (two) times daily with a meal.     finasteride 5 MG tablet  Commonly known as:  PROSCAR  Take 5 mg by mouth at bedtime.     furosemide 20 MG tablet  Commonly known as:  LASIX  Take 10 mg by mouth daily.     HYDROcodone-acetaminophen 5-325 MG per tablet  Commonly known as:  NORCO  Take 1 tablet by mouth every 6 (six) hours as needed for severe pain.     MAPAP 500 MG tablet  Generic drug:  acetaminophen  Take 500 mg by mouth 3 (three) times daily.     multivitamins with iron Tabs tablet  Take 1 tablet by mouth every evening.     ondansetron 4 MG tablet  Commonly known as:  ZOFRAN  Take 4 mg by mouth every 12 (twelve) hours as needed for nausea or vomiting.     polyethylene glycol packet  Commonly known as:  MIRALAX / GLYCOLAX  Take 17 g by mouth daily.     potassium chloride 10 MEQ tablet  Commonly known as:  K-DUR  Take 1 tablet (10 mEq total) by mouth daily.     senna-docusate 8.6-50 MG per tablet  Commonly known as:  Senokot-S  Take 1 tablet by mouth every evening.     sertraline 25 MG tablet  Commonly known as:  ZOLOFT  Take 37.5 mg by mouth at bedtime.     terazosin 5 MG capsule  Commonly known as:  HYTRIN  Take 5 mg by mouth at bedtime.           Follow-up Information    Follow up with Velna Ochs, MD. Schedule an appointment as soon as possible for a visit in 2 weeks.   Specialty:  Orthopedic Surgery   Contact information:   [address] [place] [address] [phone]        The results of significant diagnostics from this hospitalization (including imaging, microbiology,  ancillary and laboratory) are listed below for reference.    Significant Diagnostic Studies: Dg Chest 1 View  10/31/2014   CLINICAL DATA:  Confusion. Status post fall with a left hip fracture. Preoperative exam.  EXAM: CHEST  1 VIEW  COMPARISON:  PA and lateral chest 09/17/2014.  FINDINGS:  The lungs are clear. Heart size is normal. There is no pneumothorax or pleural effusion. No focal bony abnormality.  IMPRESSION: No acute disease.   Electronically Signed   By: Drusilla Kanner M.D.   On: 10/31/2014 10:36   Pelvis Portable  11/02/2014   CLINICAL DATA:  Status post left hip arthroplasty.  EXAM: PORTABLE PELVIS 1-2 VIEWS  COMPARISON:  03/17/2014  FINDINGS: Interval left hip and be arthroplasty. The hardware components are in anatomic alignment and no complications noted. Gas is identified within the surrounding soft tissues. Stable appearance of right hip arthroplasty device.  IMPRESSION: 1. No complications after left hip he hemi arthroplasty.   Electronically Signed   By: Signa Kell M.D.   On: 11/02/2014 14:27   Dg Chest Port 1 View  11/01/2014   CLINICAL DATA:  Cough and fever, possible aspiration.  EXAM: PORTABLE CHEST - 1 VIEW  COMPARISON:  10/31/2014  FINDINGS: Patient slightly rotated to the right. There is worsening interstitial prominence bilaterally with new mixed interstitial airspace density over the lung bases suggesting infection. No evidence effusion or pneumothorax. Cardiomediastinal silhouette and remainder of the exam is unchanged.  IMPRESSION: New mild bilateral interstitial prominence with bibasilar mixed interstitial airspace density suggesting atelectasis or infection.   Electronically Signed   By: Elberta Fortis M.D.   On: 11/01/2014 21:07   Dg Hip Unilat W Or W/o Pelvis 2-3 Views Left  10/31/2014   CLINICAL DATA:  Fall today with left hip pain.  Initial encounter.  EXAM: DG HIP (WITH OR WITHOUT PELVIS) 2-3V LEFT  COMPARISON:  Pelvic and right hip films on 03/17/2014  FINDINGS:  There is evidence of an acute subcapital fracture of the left hip with mild impaction and angulation present. No dislocation. The bony pelvis is intact without evidence of fracture. Visualized aspect of a bipolar prosthesis on the right shows stable and normal alignment.  IMPRESSION: Acute left-sided subcapital hip fracture with mild impaction and angulation.   Electronically Signed   By: Irish Lack M.D.   On: 10/31/2014 09:54    Microbiology: Recent Results (from the past 240 hour(s))  Surgical pcr screen     Status: Abnormal   Collection Time: 11/01/14  6:45 AM  Result Value Ref Range Status   MRSA, PCR POSITIVE (A) NEGATIVE Final   Staphylococcus aureus POSITIVE (A) NEGATIVE Final    Comment:        The Xpert SA Assay (FDA approved for NASAL specimens in patients over 43 years of age), is one component of a comprehensive surveillance program.  Test performance has been validated by [facility] for patients greater than or equal to 32 year old. It is not intended to diagnose infection nor to guide or monitor treatment. CRITICAL RESULT CALLED TO, READ BACK BY AND VERIFIED WITH: S.WHITTMORE AT 1610 ON 11/01/14 BY S.VANHOORNE      Labs: Basic Metabolic Panel:  Recent Labs Lab 10/31/14 1040 11/01/14 0414 11/02/14 0506 11/03/14 0435 11/04/14 0645  NA 136 135 136 135 135  K 3.9 3.4* 3.2* 3.1* 2.8*  CL 102 103 106 105 106  CO2 24 24 22  21* 22  GLUCOSE 100* 131* 124* 145* 125*  BUN 17 21* 18 20 23*  CREATININE 1.16 1.16 1.08 0.99 1.02  CALCIUM 8.8* 8.1* 7.8* 7.8* 7.5*   Liver Function Tests:  Recent Labs Lab 10/31/14 1040  AST 21  ALT 13*  ALKPHOS 47  BILITOT 0.3  PROT 7.0  ALBUMIN 3.6   No results for  input(s): LIPASE, AMYLASE in the last 168 hours. No results for input(s): AMMONIA in the last 168 hours. CBC:  Recent Labs Lab 10/31/14 1040 11/01/14 0414 11/03/14 0435 11/04/14 0645  WBC 4.8 6.0 7.1 7.9  HGB 9.8* 8.2* 10.0* 8.2*  HCT 29.5* 25.1*  29.0* 24.2*  MCV 98.7 98.0 95.1 94.5  PLT 261 205 149* 148*   Cardiac Enzymes: No results for input(s): CKTOTAL, CKMB, CKMBINDEX, TROPONINI in the last 168 hours. BNP: BNP (last 3 results) No results for input(s): BNP in the last 8760 hours.  ProBNP (last 3 results) No results for input(s): PROBNP in the last 8760 hours.  CBG:  Recent Labs Lab 11/02/14 1656  GLUCAP 113*

## 2014-11-05 ENCOUNTER — Non-Acute Institutional Stay (SKILLED_NURSING_FACILITY): Payer: Medicare Other | Admitting: Adult Health

## 2014-11-05 DIAGNOSIS — F0391 Unspecified dementia with behavioral disturbance: Secondary | ICD-10-CM | POA: Diagnosis not present

## 2014-11-05 DIAGNOSIS — D500 Iron deficiency anemia secondary to blood loss (chronic): Secondary | ICD-10-CM | POA: Diagnosis not present

## 2014-11-05 DIAGNOSIS — S72002S Fracture of unspecified part of neck of left femur, sequela: Secondary | ICD-10-CM | POA: Diagnosis not present

## 2014-11-05 DIAGNOSIS — N400 Enlarged prostate without lower urinary tract symptoms: Secondary | ICD-10-CM | POA: Diagnosis not present

## 2014-11-05 DIAGNOSIS — E876 Hypokalemia: Secondary | ICD-10-CM

## 2014-11-05 DIAGNOSIS — I10 Essential (primary) hypertension: Secondary | ICD-10-CM

## 2014-11-05 DIAGNOSIS — F32A Depression, unspecified: Secondary | ICD-10-CM

## 2014-11-05 DIAGNOSIS — K5900 Constipation, unspecified: Secondary | ICD-10-CM

## 2014-11-05 DIAGNOSIS — F329 Major depressive disorder, single episode, unspecified: Secondary | ICD-10-CM

## 2014-11-07 ENCOUNTER — Encounter: Payer: Self-pay | Admitting: Adult Health

## 2014-11-07 NOTE — Progress Notes (Signed)
Patient ID: Russell [name], male   DOB: 1916-04-28, 81 y.o.   MRN: [id number]    DATE:  11/05/14 MRN:  [id number]  BIRTHDAY: [birth year]  Facility:  Nursing Home Location:  [facility]  Nursing Home Room Number: 306-2  LEVEL OF CARE:  [phone])  Contact Information    Name Relation Home Work Mobile   [place] Son [phone]  [phone]   Russell [name], Russell [name]   [phone]       Chief Complaint  Patient presents with  . Hospitalization Follow-up    Left hip fracture S/P left hip hemiarthroplasty, Anemia, BPH, Dementia, Hypokalemia, Constipation, Depression and Hypertension    HISTORY OF PRESENT ILLNESS:  This is a 81 year old male who was been admitted to [facility] on 11/04/14 from [facility]. He has PMH of dementia, BPH, anemia and hypertension. He had a fall and sustained a left hip fracture. He had a left hemiarthroplasty on 11/02/14.  Noted left hip surgical site dressing which has moderate amount of serosanguinous drainage. He is alert and verbally responsive.  He has been admitted for a short-term rehabilitation.  PAST MEDICAL HISTORY:  Past Medical History  Diagnosis Date  . Stroke   . Acute bronchitis   . Hypertension      CURRENT MEDICATIONS: Reviewed  Patient's Medications  New Prescriptions   No medications on file  Previous Medications   ACETAMINOPHEN (MAPAP) 500 MG TABLET    Take 500 mg by mouth 3 (three) times daily.   AMINO ACIDS-PROTEIN HYDROLYS (FEEDING SUPPLEMENT, PRO-STAT SUGAR FREE 64,) LIQD    Take 30 mLs by mouth daily. Once daily according to [facility]   ASPIRIN EC 325 MG EC TABLET    Take 1 tablet (325 mg total) by mouth 2 (two) times daily at 8 am and 10 pm.   DIVALPROEX (DEPAKOTE SPRINKLE) 125 MG CAPSULE    Take 125 mg by mouth at bedtime.    ENSURE PLUS (ENSURE PLUS) LIQD    Take 237 mLs by mouth 2 (two) times daily.    FEEDING SUPPLEMENT, ENSURE ENLIVE, (ENSURE ENLIVE) LIQD    Take 237 mLs by mouth 2 (two)  times daily between meals.   FERROUS SULFATE 325 (65 FE) MG TABLET    Take 1 tablet (325 mg total) by mouth 2 (two) times daily with a meal.   FINASTERIDE (PROSCAR) 5 MG TABLET    Take 5 mg by mouth at bedtime.   FUROSEMIDE (LASIX) 20 MG TABLET    Take 10 mg by mouth daily.   HYDROCODONE-ACETAMINOPHEN (NORCO) 5-325 MG PER TABLET    Take 1 tablet by mouth every 6 (six) hours as needed for severe pain.   MULTIPLE VITAMINS-IRON (MULTIVITAMINS WITH IRON) TABS TABLET    Take 1 tablet by mouth every evening.    ONDANSETRON (ZOFRAN) 4 MG TABLET    Take 4 mg by mouth every 12 (twelve) hours as needed for nausea or vomiting.   POLYETHYLENE GLYCOL (MIRALAX / GLYCOLAX) PACKET    Take 17 g by mouth daily.   POTASSIUM CHLORIDE (K-DUR) 10 MEQ TABLET    Take 1 tablet (10 mEq total) by mouth daily.   SENNA-DOCUSATE (SENOKOT-S) 8.6-50 MG PER TABLET    Take 1 tablet by mouth every evening.    SERTRALINE (ZOLOFT) 25 MG TABLET    Take 37.5 mg by mouth at bedtime.   TERAZOSIN (HYTRIN) 5 MG CAPSULE    Take 5 mg by mouth at bedtime.  Modified Medications  No medications on file  Discontinued Medications   No medications on file     Allergies  Allergen Reactions  . Ambien [Zolpidem Tartrate] Other (See Comments)    Unknown reaction  . Penicillins Other (See Comments)    Unknown reaction     REVIEW OF SYSTEMS:  GENERAL: no change in appetite, no fatigue, no weight changes, no fever, chills or weakness EYES: Denies change in vision, dry eyes, eye pain, itching or discharge EARS: Denies change in hearing, ringing in ears, or earache NOSE: Denies nasal congestion or epistaxis MOUTH and THROAT: Denies oral discomfort, gingival pain or bleeding, pain from teeth or hoarseness   RESPIRATORY: no cough, SOB, DOE, wheezing, hemoptysis CARDIAC: no chest pain, edema or palpitations GI: no abdominal pain, diarrhea, constipation, heart burn, nausea or vomiting GU: Denies dysuria, frequency, hematuria, incontinence,  or discharge PSYCHIATRIC: Denies feeling of depression or anxiety. No report of hallucinations, insomnia, paranoia, or agitation   PHYSICAL EXAMINATION  GENERAL APPEARANCE: Well nourished. In no acute distress. Normal body habitus SKIN:  Left hip surgical incision with staples, dressing with moderate amount of serosanguinous drainage HEAD: Normal in size and contour. No evidence of trauma EYES: Lids open and close normally. No blepharitis, entropion or ectropion. PERRL. Conjunctivae are clear and sclerae are white. Lenses are without opacity EARS: Pinnae are normal. Patient hears normal voice tunes of the examiner MOUTH and THROAT: Lips are without lesions. Oral mucosa is moist and without lesions. Tongue is normal in shape, size, and color and without lesions NECK: supple, trachea midline, no neck masses, no thyroid tenderness, no thyromegaly LYMPHATICS: no LAN in the neck, no supraclavicular LAN RESPIRATORY: breathing is even & unlabored, BS CTAB CARDIAC: RRR, no murmur,no extra heart sounds, no edema GI: abdomen soft, normal BS, no masses, no tenderness, no hepatomegaly, no splenomegaly EXTREMITIES:  Able to move x 4 extremities; with left leg immobilizer PSYCHIATRIC: Alert and oriented to self, affect and behavior are appropriate  LABS/RADIOLOGY: Labs reviewed: Basic Metabolic Panel:  Recent Labs  91/47/82 0459  11/02/14 0506 11/03/14 0435 11/04/14 0645  NA 133*  < > 136 135 135  K 5.0  < > 3.2* 3.1* 2.8*  CL 105  < > 106 105 106  CO2 22  < > 22 21* 22  GLUCOSE 91  < > 124* 145* 125*  BUN 31*  < > 18 20 23*  CREATININE 1.25*  < > 1.08 0.99 1.02  CALCIUM 7.6*  < > 7.8* 7.8* 7.5*  MG 1.9  --   --   --   --   < > = values in this interval not displayed. Liver Function Tests:  Recent Labs  09/11/14 1937 09/12/14 0550 10/31/14 1040  AST 23 27 21   ALT 12* 14* 13*  ALKPHOS 38 35* 47  BILITOT 0.5 <0.1* 0.3  PROT 6.4* 6.3* 7.0  ALBUMIN 3.1* 3.0* 3.6   CBC:  Recent  Labs  09/11/14 1937 09/12/14 0550  09/17/14 1328  11/01/14 0414 11/03/14 0435 11/04/14 0645  WBC 6.8 14.6*  < > 5.5  < > 6.0 7.1 7.9  NEUTROABS 5.4 12.8*  --  3.4  --   --   --   --   HGB 8.4* 8.5*  < > 9.1*  < > 8.2* 10.0* 8.2*  HCT 25.1* 25.2*  < > 26.5*  < > 25.1* 29.0* 24.2*  MCV 95.4 95.5  < > 94.3  < > 98.0 95.1 94.5  PLT 242 234  < >  360  < > 205 149* 148*  < > = values in this interval not displayed.  CBG:  Recent Labs  09/17/14 1328 11/02/14 1656  GLUCAP 95 113*      Dg Chest 1 View  10/31/2014   CLINICAL DATA:  Confusion. Status post fall with a left hip fracture. Preoperative exam.  EXAM: CHEST  1 VIEW  COMPARISON:  PA and lateral chest 09/17/2014.  FINDINGS: The lungs are clear. Heart size is normal. There is no pneumothorax or pleural effusion. No focal bony abnormality.  IMPRESSION: No acute disease.   Electronically Signed   By: Drusilla Kanner M.D.   On: 10/31/2014 10:36   Pelvis Portable  11/02/2014   CLINICAL DATA:  Status post left hip arthroplasty.  EXAM: PORTABLE PELVIS 1-2 VIEWS  COMPARISON:  03/17/2014  FINDINGS: Interval left hip and be arthroplasty. The hardware components are in anatomic alignment and no complications noted. Gas is identified within the surrounding soft tissues. Stable appearance of right hip arthroplasty device.  IMPRESSION: 1. No complications after left hip he hemi arthroplasty.   Electronically Signed   By: Signa Kell M.D.   On: 11/02/2014 14:27   Dg Chest Port 1 View  11/01/2014   CLINICAL DATA:  Cough and fever, possible aspiration.  EXAM: PORTABLE CHEST - 1 VIEW  COMPARISON:  10/31/2014  FINDINGS: Patient slightly rotated to the right. There is worsening interstitial prominence bilaterally with new mixed interstitial airspace density over the lung bases suggesting infection. No evidence effusion or pneumothorax. Cardiomediastinal silhouette and remainder of the exam is unchanged.  IMPRESSION: New mild bilateral interstitial  prominence with bibasilar mixed interstitial airspace density suggesting atelectasis or infection.   Electronically Signed   By: Elberta Fortis M.D.   On: 11/01/2014 21:07   Dg Hip Unilat W Or W/o Pelvis 2-3 Views Left  10/31/2014   CLINICAL DATA:  Fall today with left hip pain.  Initial encounter.  EXAM: DG HIP (WITH OR WITHOUT PELVIS) 2-3V LEFT  COMPARISON:  Pelvic and right hip films on 03/17/2014  FINDINGS: There is evidence of an acute subcapital fracture of the left hip with mild impaction and angulation present. No dislocation. The bony pelvis is intact without evidence of fracture. Visualized aspect of a bipolar prosthesis on the right shows stable and normal alignment.  IMPRESSION: Acute left-sided subcapital hip fracture with mild impaction and angulation.   Electronically Signed   By: Irish Lack M.D.   On: 10/31/2014 09:54    ASSESSMENT/PLAN:  Left hip fracture S/P left hip hemiarthroplasty - for rehabilitation; follow-up with Dr. Marcene Corning, orthopedic surgeon, in 2 weeks; continue aspirin 325 mg 1 tab by mouth twice a day for DVT prophylaxis; Norco 5/325 mg 1 tab by mouth every 6 hours when necessary for pain  Anemia, iron deficiency - hemoglobin 8.2; continue ferrous sulfate 325 mg 1 tab by mouth twice a day; check CBC stat  BPH - continue finasteride 5 mg 1 tab by mouth daily at bedtime and Hytrin 5 mg 1 capsule by mouth daily at bedtime  Dementia with behavioral disturbance - continue Depakote 125 mg 1 capsule by mouth daily at bedtime  Hypokalemia - K2.8; continue KCl 10 meq 1 tab by mouth daily; BMP stat  Constipation - continue MiraLAX 17 mg by mouth daily and Senna-S 8.6-50 mg 1tab PO Q evening  Depression - mood is stable; continue Zoloft 25 mg take 1-1/2 tab = 37.5 mg by mouth PO Q HS  Hypertension -  continue Lasix 20 mg 1 tab by mouth daily    Goals of care:  Short-term rehabilitation    [facility], NP [facility] [phone]

## 2014-11-08 ENCOUNTER — Encounter: Payer: Self-pay | Admitting: Internal Medicine

## 2014-11-08 ENCOUNTER — Non-Acute Institutional Stay (SKILLED_NURSING_FACILITY): Payer: Medicare Other | Admitting: Internal Medicine

## 2014-11-08 DIAGNOSIS — L89151 Pressure ulcer of sacral region, stage 1: Secondary | ICD-10-CM | POA: Diagnosis not present

## 2014-11-08 DIAGNOSIS — N400 Enlarged prostate without lower urinary tract symptoms: Secondary | ICD-10-CM

## 2014-11-08 DIAGNOSIS — I10 Essential (primary) hypertension: Secondary | ICD-10-CM

## 2014-11-08 DIAGNOSIS — F0391 Unspecified dementia with behavioral disturbance: Secondary | ICD-10-CM

## 2014-11-08 DIAGNOSIS — S72002S Fracture of unspecified part of neck of left femur, sequela: Secondary | ICD-10-CM | POA: Diagnosis not present

## 2014-11-08 DIAGNOSIS — L98429 Non-pressure chronic ulcer of back with unspecified severity: Secondary | ICD-10-CM

## 2014-11-08 DIAGNOSIS — J690 Pneumonitis due to inhalation of food and vomit: Secondary | ICD-10-CM

## 2014-11-08 DIAGNOSIS — D62 Acute posthemorrhagic anemia: Secondary | ICD-10-CM | POA: Diagnosis not present

## 2014-11-08 DIAGNOSIS — K5900 Constipation, unspecified: Secondary | ICD-10-CM

## 2014-11-08 DIAGNOSIS — F03918 Unspecified dementia, unspecified severity, with other behavioral disturbance: Secondary | ICD-10-CM

## 2014-11-08 NOTE — Progress Notes (Signed)
Patient ID: Russell [name], male   DOB: Jul 22, 1916, 81 y.o.   MRN: [id number]      [facility]  PCP: Pcp Not In System  Code Status: DNR  Allergies  Allergen Reactions  . Ambien [Zolpidem Tartrate] Other (See Comments)    Unknown reaction  . Penicillins Other (See Comments)    Unknown reaction    Chief Complaint  Patient presents with  . New Admit To SNF    New Admission      HPI:  81 y.o. patient is here for short term rehabilitation post hospital admission from 10/31/14-11/04/14 post fall with left hip fracture. He underwent left hip hemiarthroplasty. He was diagnosed to have pneumonia and pulmonary congestion on chest xray over the weekend and is now on levaquin and a dose of lasix was given. He is seen in his room today. He has PMH of dementia, BPH, anemia. He is pleasantly confused. He denies being in pain. No concern from staff  Review of Systems:  Constitutional: Negative for fever, chills HENT: Negative for headache, congestion, nasal discharge Eyes: Negative for eye pain, blurred vision, double vision and discharge.  Respiratory: Negative for shortness of breath and wheezing.   Cardiovascular: Negative for chest pain, palpitations, leg swelling.  Gastrointestinal: Negative for heartburn, nausea, vomiting, abdominal pain Genitourinary: Negative for dysuria Musculoskeletal: Negative for fall in facility Skin: Negative for itching, rash.  Neurological: Negative for dizziness Psychiatric/Behavioral: Negative for depression. Positive for memory loss.    Past Medical History  Diagnosis Date  . Stroke   . Acute bronchitis   . Hypertension    Past Surgical History  Procedure Laterality Date  . Hernia repair    . Lumbar disc surgery    . Hip arthroplasty Right 08/26/2012    Procedure: RIGHT HEMI HIP ARTHROPLASTY ;  Surgeon: Velna Ochs, MD;  Location: [facility];  Service: Orthopedics;  Laterality: Right;  . Joint replacement      right total hip    . Back surgery      lumbar disc surgery  . Hip arthroplasty Left 11/02/2014    Procedure: LEFT HIP HEMI ARTHROPLASTY  (HEMIARTHROPLASTY);  Surgeon: Marcene Corning, MD;  Location: [facility];  Service: Orthopedics;  Laterality: Left;   Social History:   reports that he has never smoked. He has never used smokeless tobacco. He reports that he does not drink alcohol or use illicit drugs.  Family History  Problem Relation Age of Onset  . CVA Mother     Medications:   Medication List       This list is accurate as of: 11/08/14  3:24 PM.  Always use your most recent med list.               aspirin 325 MG EC tablet  Take 1 tablet (325 mg total) by mouth 2 (two) times daily at 8 am and 10 pm.     DECUBI-VITE PO  Take by mouth daily. For supplement     divalproex 125 MG capsule  Commonly known as:  DEPAKOTE SPRINKLE  Take 125 mg by mouth at bedtime.     ferrous sulfate 325 (65 FE) MG tablet  Take 1 tablet (325 mg total) by mouth 2 (two) times daily with a meal.     finasteride 5 MG tablet  Commonly known as:  PROSCAR  Take 5 mg by mouth at bedtime.     furosemide 20 MG tablet  Commonly known as:  LASIX  Take 10 mg by mouth daily.     HYDROcodone-acetaminophen 5-325 MG per tablet  Commonly known as:  NORCO  Take 1 tablet by mouth every 6 (six) hours as needed for severe pain.     levofloxacin 500 MG tablet  Commonly known as:  LEVAQUIN  Take 500 mg by mouth daily. X 14 days starting 11/06/14     MAPAP 500 MG tablet  Generic drug:  acetaminophen  Take 500 mg by mouth 3 (three) times daily.     multivitamins with iron Tabs tablet  Take 1 tablet by mouth every evening.     ondansetron 4 MG tablet  Commonly known as:  ZOFRAN  Take 4 mg by mouth every 12 (twelve) hours as needed for nausea or vomiting.     polyethylene glycol packet  Commonly known as:  MIRALAX / GLYCOLAX  Take 17 g by mouth daily.     potassium chloride 10 MEQ tablet  Commonly known as:  K-DUR  Take  1 tablet (10 mEq total) by mouth daily.     saccharomyces boulardii 250 MG capsule  Commonly known as:  FLORASTOR  Take 250 mg by mouth 2 (two) times daily. X 3 weeks starting on 11/06/14     senna-docusate 8.6-50 MG per tablet  Commonly known as:  Senokot-S  Take 1 tablet by mouth every evening.     sertraline 25 MG tablet  Commonly known as:  ZOLOFT  Take 37.5 mg by mouth at bedtime.     terazosin 5 MG capsule  Commonly known as:  HYTRIN  Take 5 mg by mouth at bedtime.         Physical Exam: Filed Vitals:   11/08/14 1516  BP: 110/70  Pulse: 83  Temp: 98.6 F (37 C)  TempSrc: Oral  Resp: 19  Height: 6\' 1"  (1.854 m)  Weight: 124 lb (56.246 kg)  SpO2: 98%    General- elderly male, thin built, frail, in no acute distress Head- normocephalic, atraumatic Throat- moist mucus membrane Eyes- no pallor, no icterus, no discharge, normal conjunctiva, normal sclera Neck- no cervical lymphadenopathy, no jugular vein distension Cardiovascular- normal s1,s2, no murmurs, palpable dorsalis pedis and radial pulses, no leg edema Respiratory- bilateral poor air entry, no wheeze, no rhonchi, no crackles, no use of accessory muscles Abdomen- bowel sounds present, soft, non tender Musculoskeletal- able to move all 4 extremities, left leg in immobilizer, on wheelchair Neurological- no focal deficit, alert and oriented to person Skin- warm and dry Psychiatry- normal mood and affect   Labs reviewed: Basic Metabolic Panel:  Recent Labs  16/10/96 0459  11/02/14 0506 11/03/14 0435 11/04/14 0645  NA 133*  < > 136 135 135  K 5.0  < > 3.2* 3.1* 2.8*  CL 105  < > 106 105 106  CO2 22  < > 22 21* 22  GLUCOSE 91  < > 124* 145* 125*  BUN 31*  < > 18 20 23*  CREATININE 1.25*  < > 1.08 0.99 1.02  CALCIUM 7.6*  < > 7.8* 7.8* 7.5*  MG 1.9  --   --   --   --   < > = values in this interval not displayed. Liver Function Tests:  Recent Labs  09/11/14 1937 09/12/14 0550 10/31/14 1040   AST 23 27 21   ALT 12* 14* 13*  ALKPHOS 38 35* 47  BILITOT 0.5 <0.1* 0.3  PROT 6.4* 6.3* 7.0  ALBUMIN 3.1* 3.0* 3.6   No results for  input(s): LIPASE, AMYLASE in the last 8760 hours. No results for input(s): AMMONIA in the last 8760 hours. CBC:  Recent Labs  09/11/14 1937 09/12/14 0550  09/17/14 1328  11/01/14 0414 11/03/14 0435 11/04/14 0645  WBC 6.8 14.6*  < > 5.5  < > 6.0 7.1 7.9  NEUTROABS 5.4 12.8*  --  3.4  --   --   --   --   HGB 8.4* 8.5*  < > 9.1*  < > 8.2* 10.0* 8.2*  HCT 25.1* 25.2*  < > 26.5*  < > 25.1* 29.0* 24.2*  MCV 95.4 95.5  < > 94.3  < > 98.0 95.1 94.5  PLT 242 234  < > 360  < > 205 149* 148*  < > = values in this interval not displayed. Cardiac Enzymes: No results for input(s): CKTOTAL, CKMB, CKMBINDEX, TROPONINI in the last 8760 hours. BNP: Invalid input(s): POCBNP CBG:  Recent Labs  09/17/14 1328 11/02/14 1656  GLUCAP 95 113*    Radiological Exams: Dg Chest 1 View  10/31/2014   CLINICAL DATA:  Confusion. Status post fall with a left hip fracture. Preoperative exam.  EXAM: CHEST  1 VIEW  COMPARISON:  PA and lateral chest 09/17/2014.  FINDINGS: The lungs are clear. Heart size is normal. There is no pneumothorax or pleural effusion. No focal bony abnormality.  IMPRESSION: No acute disease.   Electronically Signed   By: Drusilla Kanner M.D.   On: 10/31/2014 10:36   Dg Chest Port 1 View  11/01/2014   CLINICAL DATA:  Cough and fever, possible aspiration.  EXAM: PORTABLE CHEST - 1 VIEW  COMPARISON:  10/31/2014  FINDINGS: Patient slightly rotated to the right. There is worsening interstitial prominence bilaterally with new mixed interstitial airspace density over the lung bases suggesting infection. No evidence effusion or pneumothorax. Cardiomediastinal silhouette and remainder of the exam is unchanged.  IMPRESSION: New mild bilateral interstitial prominence with bibasilar mixed interstitial airspace density suggesting atelectasis or infection.    Electronically Signed   By: Elberta Fortis M.D.   On: 11/01/2014 21:07   Dg Hip Unilat W Or W/o Pelvis 2-3 Views Left  10/31/2014   CLINICAL DATA:  Fall today with left hip pain.  Initial encounter.  EXAM: DG HIP (WITH OR WITHOUT PELVIS) 2-3V LEFT  COMPARISON:  Pelvic and right hip films on 03/17/2014  FINDINGS: There is evidence of an acute subcapital fracture of the left hip with mild impaction and angulation present. No dislocation. The bony pelvis is intact without evidence of fracture. Visualized aspect of a bipolar prosthesis on the right shows stable and normal alignment.  IMPRESSION: Acute left-sided subcapital hip fracture with mild impaction and angulation.   Electronically Signed   By: Irish Lack M.D.   On: 10/31/2014 09:54    Assessment/Plan  Left hip fracture  S/P left hip hemiarthroplasty. Continue norco 5-325 1 tab q6h prn pain and aspirin 325 mg bid for dvt prophylaxis. Has follow up with orthopedics. Will have him work with physical therapy and occupational therapy team to help with gait training and muscle strengthening exercises.fall precautions. Skin care. Encourage to be out of bed.   Blood loss Anemia Hb 8.2, continue ferrous uslfate 325 mg bid and monitor h&h  Pneumonia Aspiration pneumonia, continue and complete 1 week course of levaquin, aspiration precautions and dysphagia diet, SLP follow up  Stage 1 sacral ulcer Pressure ulcer prophylaxis, skin care  HTN Stab;e, continue lasix 20 mg daily for now and monitor bp  Constipation Stable, continue senna s and miralax and monitor  Dementia Stable, continue depakote 125 mg daily and zoloft 37.5 mg daily for behavioral disturbances  BPH continue finasteride 5 mg daily and Hytrin 5 mg daily    Goals of care: short term rehabilitation   Labs/tests ordered: cbc, cmp 1 week  Family/ staff Communication: reviewed care plan with patient and nursing supervisor    Oneal Grout, MD  [facility] [phone] (Monday-Friday 8 am - 5 pm) [phone] (afterhours)

## 2014-11-12 ENCOUNTER — Emergency Department (HOSPITAL_COMMUNITY): Payer: Medicare Other

## 2014-11-12 ENCOUNTER — Inpatient Hospital Stay (HOSPITAL_COMMUNITY): Payer: Medicare Other

## 2014-11-12 ENCOUNTER — Encounter (HOSPITAL_COMMUNITY): Payer: Self-pay | Admitting: *Deleted

## 2014-11-12 ENCOUNTER — Inpatient Hospital Stay (HOSPITAL_COMMUNITY)
Admission: EM | Admit: 2014-11-12 | Discharge: 2014-12-09 | DRG: 871 | Disposition: E | Payer: Medicare Other | Attending: Family Medicine | Admitting: Family Medicine

## 2014-11-12 DIAGNOSIS — I249 Acute ischemic heart disease, unspecified: Secondary | ICD-10-CM | POA: Diagnosis present

## 2014-11-12 DIAGNOSIS — I959 Hypotension, unspecified: Secondary | ICD-10-CM | POA: Diagnosis present

## 2014-11-12 DIAGNOSIS — Z66 Do not resuscitate: Secondary | ICD-10-CM | POA: Diagnosis present

## 2014-11-12 DIAGNOSIS — F0391 Unspecified dementia with behavioral disturbance: Secondary | ICD-10-CM | POA: Diagnosis not present

## 2014-11-12 DIAGNOSIS — J9601 Acute respiratory failure with hypoxia: Secondary | ICD-10-CM | POA: Diagnosis present

## 2014-11-12 DIAGNOSIS — Z7982 Long term (current) use of aspirin: Secondary | ICD-10-CM

## 2014-11-12 DIAGNOSIS — I248 Other forms of acute ischemic heart disease: Secondary | ICD-10-CM | POA: Diagnosis present

## 2014-11-12 DIAGNOSIS — I10 Essential (primary) hypertension: Secondary | ICD-10-CM | POA: Diagnosis present

## 2014-11-12 DIAGNOSIS — T68XXXA Hypothermia, initial encounter: Secondary | ICD-10-CM | POA: Diagnosis present

## 2014-11-12 DIAGNOSIS — R0602 Shortness of breath: Secondary | ICD-10-CM | POA: Diagnosis present

## 2014-11-12 DIAGNOSIS — Z96649 Presence of unspecified artificial hip joint: Secondary | ICD-10-CM | POA: Diagnosis present

## 2014-11-12 DIAGNOSIS — R1310 Dysphagia, unspecified: Secondary | ICD-10-CM | POA: Diagnosis present

## 2014-11-12 DIAGNOSIS — J90 Pleural effusion, not elsewhere classified: Secondary | ICD-10-CM | POA: Diagnosis present

## 2014-11-12 DIAGNOSIS — Z515 Encounter for palliative care: Secondary | ICD-10-CM | POA: Diagnosis not present

## 2014-11-12 DIAGNOSIS — N179 Acute kidney failure, unspecified: Secondary | ICD-10-CM | POA: Diagnosis present

## 2014-11-12 DIAGNOSIS — Z96643 Presence of artificial hip joint, bilateral: Secondary | ICD-10-CM | POA: Diagnosis present

## 2014-11-12 DIAGNOSIS — Z792 Long term (current) use of antibiotics: Secondary | ICD-10-CM

## 2014-11-12 DIAGNOSIS — I4891 Unspecified atrial fibrillation: Secondary | ICD-10-CM | POA: Diagnosis present

## 2014-11-12 DIAGNOSIS — Z8673 Personal history of transient ischemic attack (TIA), and cerebral infarction without residual deficits: Secondary | ICD-10-CM | POA: Diagnosis not present

## 2014-11-12 DIAGNOSIS — J189 Pneumonia, unspecified organism: Secondary | ICD-10-CM

## 2014-11-12 DIAGNOSIS — R739 Hyperglycemia, unspecified: Secondary | ICD-10-CM | POA: Diagnosis present

## 2014-11-12 DIAGNOSIS — I5021 Acute systolic (congestive) heart failure: Secondary | ICD-10-CM | POA: Diagnosis present

## 2014-11-12 DIAGNOSIS — A419 Sepsis, unspecified organism: Secondary | ICD-10-CM | POA: Diagnosis present

## 2014-11-12 DIAGNOSIS — R6520 Severe sepsis without septic shock: Secondary | ICD-10-CM | POA: Diagnosis present

## 2014-11-12 DIAGNOSIS — R7989 Other specified abnormal findings of blood chemistry: Secondary | ICD-10-CM

## 2014-11-12 DIAGNOSIS — R0600 Dyspnea, unspecified: Secondary | ICD-10-CM | POA: Diagnosis not present

## 2014-11-12 DIAGNOSIS — R778 Other specified abnormalities of plasma proteins: Secondary | ICD-10-CM

## 2014-11-12 DIAGNOSIS — D649 Anemia, unspecified: Secondary | ICD-10-CM | POA: Diagnosis present

## 2014-11-12 DIAGNOSIS — F0390 Unspecified dementia without behavioral disturbance: Secondary | ICD-10-CM | POA: Diagnosis present

## 2014-11-12 DIAGNOSIS — D638 Anemia in other chronic diseases classified elsewhere: Secondary | ICD-10-CM | POA: Diagnosis present

## 2014-11-12 DIAGNOSIS — Y95 Nosocomial condition: Secondary | ICD-10-CM | POA: Diagnosis present

## 2014-11-12 HISTORY — DX: Pneumonia, unspecified organism: J18.9

## 2014-11-12 HISTORY — DX: Gastro-esophageal reflux disease without esophagitis: K21.9

## 2014-11-12 LAB — URINALYSIS, ROUTINE W REFLEX MICROSCOPIC
BILIRUBIN URINE: NEGATIVE
Glucose, UA: NEGATIVE mg/dL
Hgb urine dipstick: NEGATIVE
Ketones, ur: 15 mg/dL — AB
LEUKOCYTES UA: NEGATIVE
NITRITE: NEGATIVE
PROTEIN: 30 mg/dL — AB
Specific Gravity, Urine: 1.027 (ref 1.005–1.030)
UROBILINOGEN UA: 0.2 mg/dL (ref 0.0–1.0)
pH: 5.5 (ref 5.0–8.0)

## 2014-11-12 LAB — BASIC METABOLIC PANEL
Anion gap: 18 — ABNORMAL HIGH (ref 5–15)
BUN: 34 mg/dL — ABNORMAL HIGH (ref 6–20)
CO2: 18 mmol/L — ABNORMAL LOW (ref 22–32)
CREATININE: 1.57 mg/dL — AB (ref 0.61–1.24)
Calcium: 8.6 mg/dL — ABNORMAL LOW (ref 8.9–10.3)
Chloride: 104 mmol/L (ref 101–111)
GFR calc non Af Amer: 36 mL/min — ABNORMAL LOW (ref >60)
GFR, EST AFRICAN AMERICAN: 41 mL/min — AB (ref >60)
Glucose, Bld: 254 mg/dL — ABNORMAL HIGH (ref 65–99)
POTASSIUM: 3.6 mmol/L (ref 3.5–5.1)
Sodium: 140 mmol/L (ref 135–145)

## 2014-11-12 LAB — CBC WITH DIFFERENTIAL/PLATELET
BASOS ABS: 0 10*3/uL (ref 0.0–0.1)
Basophils Relative: 0 % (ref 0–1)
Eosinophils Absolute: 0 10*3/uL (ref 0.0–0.7)
Eosinophils Relative: 0 % (ref 0–5)
HCT: 24.2 % — ABNORMAL LOW (ref 39.0–52.0)
Hemoglobin: 7.8 g/dL — ABNORMAL LOW (ref 13.0–17.0)
Lymphocytes Relative: 3 % — ABNORMAL LOW (ref 12–46)
Lymphs Abs: 0.6 10*3/uL — ABNORMAL LOW (ref 0.7–4.0)
MCH: 31 pg (ref 26.0–34.0)
MCHC: 32.2 g/dL (ref 30.0–36.0)
MCV: 96 fL (ref 78.0–100.0)
MONO ABS: 1 10*3/uL (ref 0.1–1.0)
MONOS PCT: 5 % (ref 3–12)
Neutro Abs: 17.5 10*3/uL — ABNORMAL HIGH (ref 1.7–7.7)
Neutrophils Relative %: 92 % — ABNORMAL HIGH (ref 43–77)
Platelets: 598 10*3/uL — ABNORMAL HIGH (ref 150–400)
RBC: 2.52 MIL/uL — ABNORMAL LOW (ref 4.22–5.81)
RDW: 16.8 % — ABNORMAL HIGH (ref 11.5–15.5)
WBC: 19 10*3/uL — ABNORMAL HIGH (ref 4.0–10.5)

## 2014-11-12 LAB — URINE MICROSCOPIC-ADD ON

## 2014-11-12 LAB — BRAIN NATRIURETIC PEPTIDE

## 2014-11-12 LAB — I-STAT CG4 LACTIC ACID, ED
Lactic Acid, Venous: 4.01 mmol/L (ref 0.5–2.0)
Lactic Acid, Venous: 2.91 mmol/L (ref 0.5–2.0)

## 2014-11-12 LAB — I-STAT TROPONIN, ED: TROPONIN I, POC: 2.84 ng/mL — AB (ref 0.00–0.08)

## 2014-11-12 MED ORDER — LORAZEPAM 2 MG/ML IJ SOLN
1.0000 mg | INTRAMUSCULAR | Status: DC | PRN
  Administered 2014-11-12 – 2014-11-13 (×4): 1 mg via INTRAVENOUS
  Filled 2014-11-12 (×4): qty 1

## 2014-11-12 MED ORDER — SODIUM CHLORIDE 0.9 % IV SOLN
1250.0000 mg | Freq: Once | INTRAVENOUS | Status: AC
  Administered 2014-11-12: 1250 mg via INTRAVENOUS
  Filled 2014-11-12: qty 1250

## 2014-11-12 MED ORDER — SODIUM CHLORIDE 0.9 % IV BOLUS (SEPSIS)
1000.0000 mL | Freq: Once | INTRAVENOUS | Status: AC
  Administered 2014-11-12: 1000 mL via INTRAVENOUS

## 2014-11-12 MED ORDER — SODIUM CHLORIDE 0.9 % IV BOLUS (SEPSIS)
750.0000 mL | Freq: Once | INTRAVENOUS | Status: AC
  Administered 2014-11-12: 750 mL via INTRAVENOUS

## 2014-11-12 MED ORDER — SODIUM CHLORIDE 0.9 % IV BOLUS (SEPSIS): 1000.0000 mL | Freq: Once | INTRAVENOUS | Status: DC

## 2014-11-12 MED ORDER — MORPHINE SULFATE 2 MG/ML IJ SOLN
1.0000 mg | INTRAMUSCULAR | Status: DC | PRN
  Administered 2014-11-12 – 2014-11-13 (×4): 1 mg via INTRAVENOUS
  Filled 2014-11-12 (×4): qty 1

## 2014-11-12 MED ORDER — HEPARIN (PORCINE) IN NACL 100-0.45 UNIT/ML-% IJ SOLN
700.0000 [IU]/h | INTRAMUSCULAR | Status: DC
  Administered 2014-11-12: 700 [IU]/h via INTRAVENOUS
  Filled 2014-11-12: qty 250

## 2014-11-12 MED ORDER — SODIUM CHLORIDE 0.9 % IV BOLUS (SEPSIS): 30.0000 mL/kg | Freq: Once | INTRAVENOUS | Status: DC

## 2014-11-12 MED ORDER — SODIUM CHLORIDE 0.9 % IV SOLN
INTRAVENOUS | Status: DC
  Administered 2014-11-12 – 2014-11-13 (×2): via INTRAVENOUS

## 2014-11-12 MED ORDER — HEPARIN (PORCINE) IN NACL 100-0.45 UNIT/ML-% IJ SOLN: 12.0000 [IU]/kg/h | INTRAMUSCULAR | Status: DC

## 2014-11-12 MED ORDER — DEXTROSE 5 % IV SOLN: 1.0000 g | INTRAVENOUS | Status: DC

## 2014-11-12 MED ORDER — PIPERACILLIN-TAZOBACTAM 3.375 G IVPB 30 MIN
3.3750 g | Freq: Once | INTRAVENOUS | Status: DC
  Filled 2014-11-12: qty 50

## 2014-11-12 MED ORDER — HEPARIN BOLUS VIA INFUSION
3000.0000 [IU] | Freq: Once | INTRAVENOUS | Status: AC
  Administered 2014-11-12: 3000 [IU] via INTRAVENOUS
  Filled 2014-11-12: qty 3000

## 2014-11-12 MED ORDER — DEXTROSE 5 % IV SOLN
2.0000 g | INTRAVENOUS | Status: AC
  Administered 2014-11-12: 2 g via INTRAVENOUS
  Filled 2014-11-12: qty 2

## 2014-11-12 MED ORDER — HEPARIN SODIUM (PORCINE) 5000 UNIT/ML IJ SOLN: 60.0000 [IU]/kg | Freq: Once | INTRAMUSCULAR | Status: DC

## 2014-11-12 MED ORDER — VANCOMYCIN HCL 500 MG IV SOLR: 500.0000 mg | INTRAVENOUS | Status: DC

## 2014-11-12 NOTE — Clinical Documentation Improvement (Signed)
Supporting Information: 8/05: CHF listed as the reason for Echo.  8/05: BNP: >4500.0.   Possible Clinical Condition: . Document acuity --Acute --Chronic --Acute on Chronic . Document type --Diastolic --Systolic --Combined systolic and diastolic . Due to or associated with --Cardiac or other surgery --Hypertension --Valvular disease --Rheumatic heart disease Endocarditis (valvitis) Pericarditis Myocarditis --Other (specify)    Thank Gabriel Cirri Documentation Specialist [phone] [email or web address]

## 2014-11-12 NOTE — Progress Notes (Signed)
ANTIBIOTIC CONSULT NOTE - INITIAL  Pharmacy Consult for Fortaz + Heparin Indication: rule out sepsis, PNA,   Allergies  Allergen Reactions  . Ambien [Zolpidem Tartrate] Other (See Comments)    Unknown reaction  . Penicillins Other (See Comments)    Unknown reaction    Patient Measurements: Height:  (185.4 cm) Weight: 124 lb (56.246 kg) IBW/kg (Calculated) : 79.9 Adjusted Body Weight:   Vital Signs: Temp: 97.4 F (36.3 C) (08/05 0851) Temp Source: Rectal (08/05 0851) BP: 141/30 mmHg (08/05 0846) Pulse Rate: 109 (08/05 0916) Intake/Output from previous day:   Intake/Output from this shift:    Labs:  Recent Labs  Nov 13, 2014 0859  WBC 19.0*  HGB 7.8*  PLT 598*  CREATININE 1.57*   Estimated Creatinine Clearance: 22.4 mL/min (by C-G formula based on Cr of 1.57). No results for input(s): VANCOTROUGH, VANCOPEAK, VANCORANDOM, GENTTROUGH, GENTPEAK, GENTRANDOM, TOBRATROUGH, TOBRAPEAK, TOBRARND, AMIKACINPEAK, AMIKACINTROU, AMIKACIN in the last 72 hours.   Microbiology:   Medical History: Past Medical History  Diagnosis Date  . Stroke   . Acute bronchitis   . Hypertension     Assessment: 81 y/o F brought to ED after being found unresponsive at the [facility] ([facility] to [facility] on 11/04/14 from [facility])  with sats at 80%, RR 40. Pt was being treated for pneumonia with oral antibiotics. He had a left hemiarthroplasty on 11/02/14 (ASA for VTE prophx).   - Labs: Lactic acid elevated 4.01. Troponin 2.84, BNP>4500  WBC up 19, Hgb down 7.8 from last admission, Plts 148>598?, Scr 1.02>1.57 up  - PMH: Dementia, BPH, anemia, HTN, constipation, depression  Goal of Therapy:  eradication of infection  Heparin level 0.3-0.7 Monitor platelets on heparin  Plan:   Fortaz 2g IV x 1 in ED then 1g IV q24h Vancomycin  IV x 1 already given in ED 0955 IV heparin bolus 3000 units then infusion at 700 units/hr Check heparin level in 8 hrs Daily heparin level and  CBC   Rich Paprocki S. Merilynn Finland, PharmD, BCPS Clinical Staff Pharmacist Pager [phone]  Misty Stanley Stillinger 11/19/2014,10:08 AM

## 2014-11-12 NOTE — Progress Notes (Addendum)
Echocardiogram completed for 2 reasons: 1) abnormal troponin and concern for acute systolic heart failure in setting of ischemia, 2) acute hypoxic respiratory failure either from pneumonia or heart failure  Russell [name] ,ANP

## 2014-11-12 NOTE — ED Notes (Signed)
MD at bedside. 

## 2014-11-12 NOTE — ED Notes (Signed)
RT at bedside.

## 2014-11-12 NOTE — ED Notes (Signed)
Echo at bedside

## 2014-11-12 NOTE — Progress Notes (Signed)
  Echocardiogram 2D Echocardiogram has been performed.  Leta Jungling M 11/15/2014, 1:16 PM

## 2014-11-12 NOTE — Progress Notes (Signed)
Patient taken off of Bipap and placed on 40% venturi mask per MD order.  Sats currently 100% and patient is tolerating well.

## 2014-11-12 NOTE — ED Provider Notes (Signed)
History   Chief Complaint  Patient presents with  . Shortness of Breath    HPI 81 year old male with past medical history as below notable for dementia, recent left hip fracture on 7/24 who presents from rehabilitation facility for respiratory distress. EMS reports on their arrival patient was diaphoretic with sats of 80% on simple mask. Patient does not use oxygen at baseline. Patient has been receiving PO ABX for pneumonia. No report of new fevers. EMS reports they placed patient on 15 L mask and patient's breathing improved and he began speaking complete sentences. Therefore patient had EKG findings of ST depression diffusely and remained stable in route.  Past medical/surgical history, social history, medications, allergies and FH have been reviewed with patient and/or in documentation. Furthermore, if pt family or friend(s) present, additional historical information was obtained from them.  Past Medical History  Diagnosis Date  . Stroke   . Acute bronchitis   . Hypertension    Past Surgical History  Procedure Laterality Date  . Hernia repair    . Lumbar disc surgery    . Hip arthroplasty Right 08/26/2012    Procedure: RIGHT HEMI HIP ARTHROPLASTY ;  Surgeon: Velna Ochs, MD;  Location: [facility];  Service: Orthopedics;  Laterality: Right;  . Joint replacement      right total hip  . Back surgery      lumbar disc surgery  . Hip arthroplasty Left 11/02/2014    Procedure: LEFT HIP HEMI ARTHROPLASTY  (HEMIARTHROPLASTY);  Surgeon: Marcene Corning, MD;  Location: [facility];  Service: Orthopedics;  Laterality: Left;   Family History  Problem Relation Age of Onset  . CVA Mother    History  Substance Use Topics  . Smoking status: Never Smoker   . Smokeless tobacco: Never Used  . Alcohol Use: No     Review of Systems Unable to obtain 2/2 condition/dementia  Physical Exam  Physical Exam  ED Triage Vitals  Enc Vitals Group     BP --      Pulse Rate 11-24-14 0852 101   Resp 2014-11-24 0852 30     Temp 11-24-2014 0851 97.4 F (36.3 C)     Temp Source 24-Nov-2014 0851 Rectal     SpO2 11/24/2014 0831 80 %     Weight --      Height --      Head Cir --      Peak Flow --      Pain Score --      Pain Loc --      Pain Edu? --      Excl. in GC? --    Constitutional:  Patient is a chronically ill appearing elderly frail male who is in respiratory distress and unable to speak more than one to 2 words at a time. Patient is diaphoretic and ill appearing. Head: Normocephalic and atraumatic.  Eyes: Extraocular motion intact, no scleral icterus Mouth: MMM, OP clear Neck: Supple without meningismus, mass, or overt JVD Respiratory: Patient is in obvious respiratory distress mask. CV: Tachycardic and irregular rate, no obvious murmurs.  Pulses +2 and symmetric. Euvolemic Abdomen: Soft, NT, ND, no r/g. No mass.  MSK: Extremities are atraumatic without deformity, ROM intact Skin: Warm, dry, intact without rash Neuro: AAOx4, MAE 5/5 sym, no focal deficit noted   ED Course  Procedures   Labs Reviewed  BASIC METABOLIC PANEL - Abnormal; Notable for the following:    CO2 18 (*)    Glucose, Bld 254 (*)  BUN 34 (*)    Creatinine, Ser 1.57 (*)    Calcium 8.6 (*)    GFR calc non Af Amer 36 (*)    GFR calc Af Amer 41 (*)    Anion gap 18 (*)    All other components within normal limits  CBC WITH DIFFERENTIAL/PLATELET - Abnormal; Notable for the following:    WBC 19.0 (*)    RBC 2.52 (*)    Hemoglobin 7.8 (*)    HCT 24.2 (*)    RDW 16.8 (*)    Platelets 598 (*)    Neutrophils Relative % 92 (*)    Neutro Abs 17.5 (*)    Lymphocytes Relative 3 (*)    Lymphs Abs 0.6 (*)    All other components within normal limits  BRAIN NATRIURETIC PEPTIDE - Abnormal; Notable for the following:    B Natriuretic Peptide >4500.0 (*)    All other components within normal limits  URINALYSIS, ROUTINE W REFLEX MICROSCOPIC (NOT AT [facility]) - Abnormal; Notable for the following:    Color,  Urine AMBER (*)    Ketones, ur 15 (*)    Protein, ur 30 (*)    All other components within normal limits  URINE MICROSCOPIC-ADD ON - Abnormal; Notable for the following:    Bacteria, UA FEW (*)    Casts GRANULAR CAST (*)    All other components within normal limits  I-STAT TROPOININ, ED - Abnormal; Notable for the following:    Troponin i, poc 2.84 (*)    All other components within normal limits  I-STAT CG4 LACTIC ACID, ED - Abnormal; Notable for the following:    Lactic Acid, Venous 4.01 (*)    All other components within normal limits  I-STAT CG4 LACTIC ACID, ED - Abnormal; Notable for the following:    Lactic Acid, Venous 2.91 (*)    All other components within normal limits  CULTURE, BLOOD (ROUTINE X 2)  CULTURE, BLOOD (ROUTINE X 2)  URINE CULTURE  I-STAT CG4 LACTIC ACID, ED   I personally reviewed and interpreted all labs.  No results found. I personally viewed above image(s) which were used in my medical decision making. Formal interpretations by Radiology.   EKG Interpretation  Date/Time:  Friday November 12 2014 08:32:51 EDT Ventricular Rate:  112 PR Interval:    QRS Duration: 84 QT Interval:  427 QTC Calculation: 583 R Axis:   61 Text Interpretation:  Atrial fibrillation Repol abnrm suggests ischemia, diffuse leads Prolonged QT interval st depression now present in anterolateral leads Confirmed by Mirian Mo [phone]) on 11/19/2014 8:36:33 AM       MDM: Eston Esters is a 81 y.o. male with H&P as above who p/w CC:  Respiratory distress  On arrival, patient is ill appearing and has diaphoresis, tachycardia, hypotension. He appears confused. He is unable to speak full sentences. Crackles BL.  Code sepsis called and patient was given IV ABX. EKG shows diffuse ST depressions as well as ST elevation in aVR. Posterior EKG performed in an otherwise unchanged.  Labs are notable for troponin of 2 and white count of 19,000 with lactate of 4.  Patient is now placed  on BiPAP with sats of 100% and improvement in work of breathing. Given EKG findings is difficult to tell if this is sepsis versus MI. Cardiology was paged. I suspect patient will likely be medical management.   Patient's son and healthcare power of attorney was called and we had lengthy discussion regarding patient's clinical  status. The son states that pt would not wish for any extraordinary measures and would not want patient to go to cath lab or have any invasive procedures and agrees with medical management of MI with heparin and supportive care measures.  Patient will be admitted to hospitalist.  Old records reviewed (if available). Labs and imaging reviewed personally by myself and considered in medical decision making if ordered. Clinical Impression: 1. Sepsis due to pneumonia   2. Acute respiratory failure with hypoxia   3. Elevated troponin   4. Atrial fibrillation with RVR   5. Acute renal failure, unspecified acute renal failure type   6. ACS (acute coronary syndrome)     Disposition: Admit  Condition: stable  I have discussed the results, Dx and Tx plan with the pt(& family if present). He/she/they expressed understanding and agree(s) with the plan.  Pt seen in conjunction with Dr. Jomarie Longs, DO [facility] Emergency Medicine Resident - PGY-3     Ames Dura, MD 11/27/2014 1610  Mirian Mo, MD 2014-11-16 0800

## 2014-11-12 NOTE — ED Notes (Signed)
Cardiology MD at bedside.

## 2014-11-12 NOTE — ED Notes (Signed)
Family at bedside. Admitting MD aware

## 2014-11-12 NOTE — H&P (Signed)
[facility] History and Physical                                                                                    Russell [name], is a 81 y.o. male  MRN: [id number]   DOB - 11-Dec-1916  Admit Date - 11/26/2014  Outpatient Primary MD for the patient is Pcp Not In System  Referring MD: Littie Deeds / ER  Consulting M.D: Herbie Baltimore / Cardiology  With History of -  Past Medical History  Diagnosis Date  . Stroke   . Acute bronchitis   . Hypertension       Past Surgical History  Procedure Laterality Date  . Hernia repair    . Lumbar disc surgery    . Hip arthroplasty Right 08/26/2012    Procedure: RIGHT HEMI HIP ARTHROPLASTY ;  Surgeon: Velna Ochs, MD;  Location: [facility];  Service: Orthopedics;  Laterality: Right;  . Joint replacement      right total hip  . Back surgery      lumbar disc surgery  . Hip arthroplasty Left 11/02/2014    Procedure: LEFT HIP HEMI ARTHROPLASTY  (HEMIARTHROPLASTY);  Surgeon: Marcene Corning, MD;  Location: [facility];  Service: Orthopedics;  Laterality: Left;    in for   Chief Complaint  Patient presents with  . Shortness of Breath     HPI This is a 81 year old male patient with history of chronic recurrent aspiration with associated recurrent pneumonitis, underlying dementia, and that history of CVA, hypertension, and recent admission for left hip hemiarthroplasty on July 24th. He was discharged to skilled nursing facility for rehabilitation. He was sent over from same facility today for acute respiratory distress. EMS arrived to the facility to follow the patient diaphoretic with O2 sats of 80% on simple mask noting patient not on oxygen chronically. Patient recently on Levaquin for recurrent pneumonia. Patient was subsequent placed on 15 L via mask and improved from respiratory standpoint and was able to begin speaking in complete sentences. An EKG was completed and revealed findings of ST segment depression in inferior lateral leads as well as new  atrial fibrillation.  After arrival to the ER repeat EKG continue to demonstrate ST segment downsloping and depression in the inferior lateral leads. Patient initially was hypertensive with a BP of 141/30 but was tachycardic with heart rates between he was hypothermic with a rectal temp of 97.4. He was on 6 L nasal cannula oxygen with saturations of 98% but had increased respiratory rate of 24. Laboratory data revealed acute renal failure with BUN 34 and creatinine 1.57 noting baseline UN 20 and creatinine 0.99, hyperglycemia with glucose of 254, mildly elevated anion gap of 18 with him CO2 18. BNP was elevated at greater than 4500, white count was elevated at 19,000, hemoglobin was low at 7.8 with previous baseline of 8.2 on 7/28, platelets had increased to 598,000 from a baseline of 148,000 on 7/28. Patient also had a left shift with neutrophils 92% and absolute neutrophils 17.5%. Initial chest x-ray revealed a new confluent right lower lung opacity which was suspicion to be related to a combination of aspiration and pneumonia as well as a  pleural effusion. Patient was treated as healthcare acquired pneumonia and started on empiric antibiotics. Subsequent lactic acid was 4.01 and it was felt the patient was septic so aggressive fluid resuscitation was begun. Patient's troponin was also elevated at 2.84.The EDP documented an extensive conversation with the patient's son who is also a physician as well as the patient's healthcare power of attorney. They opted for no extraordinary measures and did not want the patient to go to the cath lab or undergo any invasive procedures or (press sores and primary management was to focus on medical management of pneumonia and MI. He was started on heparin and other supportive measures. After hydration patient did have transient worsening of his respiratory status and required BiPAP for several hours but has been successively weaned back down to a Ventimask. During my exam of  the patient he was unable to contribute to any history.  Since my initial evaluation of the patient both of his sons have arrived at the bedside. Another discussion was held regarding patient's poor prognosis. Both sons agree that at present time patient is critically ill and they have never seen him this sick before noting in the past he had previously been back from acute illnesses. The focus at this time is to transition to comfort measures and discontinue all aggressive medical therapies including treatment of MI and infectious processes. Patient's RN has been updated on transition of care. When necessary IV morphine and Ativan have been ordered.   Review of Systems   In addition to the HPI above,  Unable to obtain from the patient -according to the son patient has a recurrent history of aspiration pneumonitis and has been on multiple doses of oral antibiotics in the past 12 months.   *A full 10 point Review of Systems was done, except as stated above, all other Review of Systems were negative.  Social History History  Substance Use Topics  . Smoking status: Never Smoker   . Smokeless tobacco: Never Used  . Alcohol Use: No    Resides at: skilled nursing facility for rehabilitation-prior to hip fracture was at assisted living  Lives with: N/A  Ambulatory status: With assistive devices    Family History Family History  Problem Relation Age of Onset  . CVA Mother      Prior to Admission medications   Medication Sig Start Date End Date Taking? Authorizing Provider  acetaminophen (MAPAP) 500 MG tablet Take 500 mg by mouth 3 (three) times daily.   Yes Historical Provider, MD  Amino Acids-Protein Hydrolys (FEEDING SUPPLEMENT, PRO-STAT SUGAR FREE 64,) LIQD Take 30 mLs by mouth daily.   Yes Historical Provider, MD  aspirin EC 325 MG EC tablet Take 1 tablet (325 mg total) by mouth 2 (two) times daily at 8 am and 10 pm. 11/03/14  Yes Elodia Florence, PA-C  divalproex (DEPAKOTE SPRINKLE) 125  MG capsule Take 125 mg by mouth at bedtime.    Yes Historical Provider, MD  ferrous sulfate 325 (65 FE) MG tablet Take 1 tablet (325 mg total) by mouth 2 (two) times daily with a meal. 11/04/14  Yes Alison Murray, MD  finasteride (PROSCAR) 5 MG tablet Take 5 mg by mouth at bedtime.   Yes Historical Provider, MD  furosemide (LASIX) 20 MG tablet Take 10 mg by mouth daily.   Yes Historical Provider, MD  HYDROcodone-acetaminophen (NORCO) 5-325 MG per tablet Take 1 tablet by mouth every 6 (six) hours as needed for severe pain. 11/03/14  Yes Elodia Florence,  PA-C  levofloxacin (LEVAQUIN) 500 MG tablet Take 500 mg by mouth daily. X 14 days starting 11/06/14   Yes Historical Provider, MD  Multiple Vitamins-Iron (MULTIVITAMINS WITH IRON) TABS tablet Take 1 tablet by mouth every evening.    Yes Historical Provider, MD  ondansetron (ZOFRAN) 4 MG tablet Take 4 mg by mouth every 12 (twelve) hours as needed for nausea or vomiting.   Yes Historical Provider, MD  polyethylene glycol (MIRALAX / GLYCOLAX) packet Take 17 g by mouth daily. 02/11/12  Yes Joseph Art, DO  potassium chloride (K-DUR) 10 MEQ tablet Take 1 tablet (10 mEq total) by mouth daily. 11/04/14  Yes Alison Murray, MD  saccharomyces boulardii (FLORASTOR) 250 MG capsule Take 250 mg by mouth 2 (two) times daily. X 3 weeks starting on 11/06/14   Yes Historical Provider, MD  senna-docusate (SENOKOT-S) 8.6-50 MG per tablet Take 1 tablet by mouth every evening.    Yes Historical Provider, MD  sertraline (ZOLOFT) 25 MG tablet Take 37.5 mg by mouth at bedtime.   Yes Historical Provider, MD  terazosin (HYTRIN) 5 MG capsule Take 5 mg by mouth at bedtime.   Yes Historical Provider, MD    Allergies  Allergen Reactions  . Ambien [Zolpidem Tartrate] Other (See Comments)    Unknown reaction  . Penicillins Other (See Comments)    Unknown reaction    Physical Exam  Vitals  Blood pressure 94/67, pulse 108, temperature 97.4 F (36.3 C), temperature source Rectal,  resp. rate 24, height  (1.854 m), weight 124 lb (56.246 kg), SpO2 100 %.   General:  Acutely ill and toxic-appearing elderly man   Psych: Initially was somewhat agitated but unable to verbally communicate -has now become more lethargic   Neuro:   No Apparentfocal neurological deficits, CN II through XII intact, unable to accurately test strength since patient unable to follow simple commands and he is moving all extremities 4, Sensation intact all 4 extremities.  ENT:  Ears and Eyes appear Normal, Conjunctivae clear, PER. dry oral mucosa without erythema or exudates. poor dentition  Neck:  Supple, No lymphadenopathy appreciated  Respiratory:  Symmetrical chest wall movement, profoundly congested bilateral expiratory rhonchi -BiPAP recently transitioned to 50% Ventimask , decreased work of breathing as compared to when initially examined   Cardiac: Atrial fibrillation with ventricular rates between 110 and 120, No Murmurs, no LE edema noted, no JVD, No carotid bruits, peripheral pulses palpable at 1+  Abdomen: Hypoactive  bowel sounds, Soft, Non tender, Non distended,  No masses appreciated, no obvious hepatosplenomegaly  Skin:  No Cyanosis, poor Skin Turgor, No Skin Rash or Bruise. Pale  Extremities: Symmetrical without obvious trauma or injury,  no effusions.  Data Review  CBC  Recent Labs Lab 2014/12/03 0859  WBC 19.0*  HGB 7.8*  HCT 24.2*  PLT 598*  MCV 96.0  MCH 31.0  MCHC 32.2  RDW 16.8*  LYMPHSABS 0.6*  MONOABS 1.0  EOSABS 0.0  BASOSABS 0.0    Chemistries   Recent Labs Lab Dec 03, 2014 0859  NA 140  K 3.6  CL 104  CO2 18*  GLUCOSE 254*  BUN 34*  CREATININE 1.57*  CALCIUM 8.6*    estimated creatinine clearance is 22.4 mL/min (by C-G formula based on Cr of 1.57).  No results for input(s): TSH, T4TOTAL, T3FREE, THYROIDAB in the last 72 hours.  Invalid input(s): FREET3  Coagulation profile No results for input(s): INR, PROTIME in the last 168  hours.  No results for  input(s): DDIMER in the last 72 hours.  Cardiac Enzymes No results for input(s): CKMB, TROPONINI, MYOGLOBIN in the last 168 hours.  Invalid input(s): CK  Invalid input(s): POCBNP  Urinalysis    Component Value Date/Time   COLORURINE AMBER* 15-Nov-2014 1007   APPEARANCEUR CLEAR 11-15-2014 1007   LABSPEC 1.027 11/15/2014 1007   PHURINE 5.5 November 15, 2014 1007   GLUCOSEU NEGATIVE 11/15/14 1007   HGBUR NEGATIVE 2014/11/15 1007   BILIRUBINUR NEGATIVE 11-15-14 1007   KETONESUR 15* Nov 15, 2014 1007   PROTEINUR 30* 15-Nov-2014 1007   UROBILINOGEN 0.2 Nov 15, 2014 1007   NITRITE NEGATIVE November 15, 2014 1007   LEUKOCYTESUR NEGATIVE 11-15-14 1007    Imaging results:   Dg Chest 1 View  10/31/2014   CLINICAL DATA:  Confusion. Status post fall with a left hip fracture. Preoperative exam.  EXAM: CHEST  1 VIEW  COMPARISON:  PA and lateral chest 09/17/2014.  FINDINGS: The lungs are clear. Heart size is normal. There is no pneumothorax or pleural effusion. No focal bony abnormality.  IMPRESSION: No acute disease.   Electronically Signed   By: Drusilla Kanner M.D.   On: 10/31/2014 10:36   Pelvis Portable  11/02/2014   CLINICAL DATA:  Status post left hip arthroplasty.  EXAM: PORTABLE PELVIS 1-2 VIEWS  COMPARISON:  03/17/2014  FINDINGS: Interval left hip and be arthroplasty. The hardware components are in anatomic alignment and no complications noted. Gas is identified within the surrounding soft tissues. Stable appearance of right hip arthroplasty device.  IMPRESSION: 1. No complications after left hip he hemi arthroplasty.   Electronically Signed   By: Signa Kell M.D.   On: 11/02/2014 14:27   Dg Chest Port 1 View  Nov 15, 2014   CLINICAL DATA:  81 year old male with respiratory failure, hypoxia. Initial encounter.  EXAM: PORTABLE CHEST - 1 VIEW  COMPARISON:  0855 hr today, and earlier.  FINDINGS: Portable AP semi upright view at 1153 hrs. Stable lung volumes. Mixed patchy and  confluent right lung opacity is stable since earlier today. Stable cardiac size and mediastinal contours. No superimposed pneumothorax. Stable left lung with suggestion of some lower lobe collapse.  IMPRESSION: Stable chest since 0855 hrs. Confluent opacity at the right lung base and suspected left lower lobe collapse.   Electronically Signed   By: Odessa [name] M.D.   On: 11/15/14 12:01   Dg Chest Portable 1 View  Nov 15, 2014   CLINICAL DATA:  81 year old male with shortness of breath and wheezing. Possible pneumonia. Initial encounter.  EXAM: PORTABLE CHEST - 1 VIEW  COMPARISON:  11/01/2014 and earlier.  FINDINGS: Portable AP upright view at 0855 hrs. New extensive confluent opacity throughout the right lower lung. Some associated right lung volume loss. Tortuous thoracic aorta with stable mediastinal contour. Mildly increased medial left lung base/retrocardiac opacity. No pneumothorax. Pulmonary vascularity appears stable.  IMPRESSION: New confluent right lower lung opacity which may reflect a combination of aspiration/pneumonia and pleural effusion.  Left lower lobe atelectasis.   Electronically Signed   By: Odessa [name] M.D.   On: 15-Nov-2014 09:05   Dg Chest Port 1 View  11/01/2014   CLINICAL DATA:  Cough and fever, possible aspiration.  EXAM: PORTABLE CHEST - 1 VIEW  COMPARISON:  10/31/2014  FINDINGS: Patient slightly rotated to the right. There is worsening interstitial prominence bilaterally with new mixed interstitial airspace density over the lung bases suggesting infection. No evidence effusion or pneumothorax. Cardiomediastinal silhouette and remainder of the exam is unchanged.  IMPRESSION: New mild bilateral interstitial prominence with  bibasilar mixed interstitial airspace density suggesting atelectasis or infection.   Electronically Signed   By: Elberta Fortis M.D.   On: 11/01/2014 21:07   Dg Hip Unilat W Or W/o Pelvis 2-3 Views Left  10/31/2014   CLINICAL DATA:  Fall today with left hip pain.  Initial  encounter.  EXAM: DG HIP (WITH OR WITHOUT PELVIS) 2-3V LEFT  COMPARISON:  Pelvic and right hip films on 03/17/2014  FINDINGS: There is evidence of an acute subcapital fracture of the left hip with mild impaction and angulation present. No dislocation. The bony pelvis is intact without evidence of fracture. Visualized aspect of a bipolar prosthesis on the right shows stable and normal alignment.  IMPRESSION: Acute left-sided subcapital hip fracture with mild impaction and angulation.   Electronically Signed   By: Irish Lack M.D.   On: 10/31/2014 09:54     EKG: (Independently reviewed) initial EKG with atrial fibrillation ventricular rate 112 bpm, QTC prolonged at 583 ms, downsloping ST depression greater than 2 boxes and inferior lateral leads most pronounced in leads V4 and V5 -follow-up EKG 45 minutes later showed resolution of ST segment changes but persistence of atrial fibrillation and resolution of QT prolongation with QT now 464 ms    Assessment & Plan  Principal Problem:   Sepsis due to pneumonia/acute respiratory failure with hypoxemia 2/2 HCAP -Focus is now on comfort -Admit to MedSurg -DO NOT RESUSCITATE/DO NOT INTUBATE -kvo IVFs -When necessary IV morphine and Ativan for air hunger and anxiety -Per discussion with patient's sons no further antibiotics  Active Problems:   Elevated troponin -Possibly from acute coronary syndrome but more likely from demand ischemia -Focus on comfort so will not cycle cardiac enzymes any further -Echocardiogram has been completed but has not been read by cardiologist    Atrial fibrillation with RVR -Rate slightly elevated but does not appear to be contributing to discomfort for this patient -Discontinue previous telemetry    Acute renal failure -Secondary to sepsis -No further lab    Anemia -Likely combination of postoperative anemia in setting of anemia of chronic disease as well as a more acute component of critical illness -No further  labs    H/O: stroke/Dementia    DVT Prophylaxis: SCDs  Family Communication:  Sons Ethelene Browns and Rick at the bedside   Code Status:   DO NOT RESUSCITATE/DO NOT INTUBATE   Condition:  Guarded-anticipate hospital death and most likely within the next 24 hours   Time spent in minutes : 60      Jenel Gierke L. ANP on 2014-11-30 at 1:30 PM  Between 7am to 7pm - Pager - [phone]  After 7pm go to [email or web address] - password [email or web address]  And look for the night coverage person covering me after hours  [facility]

## 2014-11-12 NOTE — ED Notes (Signed)
ECHO called for stat bedside echo

## 2014-11-12 NOTE — ED Notes (Signed)
Dr. Littie Deeds made aware of Lactic and Troponin results

## 2014-11-12 NOTE — Progress Notes (Signed)
ANTIBIOTIC CONSULT NOTE - INITIAL  Pharmacy Consult for Fortaz + Heparin..[address] [place] [address] Vancomycin Indication: rule out sepsis, PNA,   Allergies  Allergen Reactions  . Ambien [Zolpidem Tartrate] Other (See Comments)    Unknown reaction  . Penicillins Other (See Comments)    Unknown reaction    Patient Measurements: Height:  (185.4 cm) Weight: 124 lb (56.246 kg) IBW/kg (Calculated) : 79.9 Adjusted Body Weight:   Vital Signs: Temp: 97.4 F (36.3 C) (08/05 0851) Temp Source: Rectal (08/05 1210) BP: 91/71 mmHg (08/05 1218) Pulse Rate: 107 (08/05 1218) Intake/Output from previous day:   Intake/Output from this shift:    Labs:  Recent Labs  11/16/2014 0859  WBC 19.0*  HGB 7.8*  PLT 598*  CREATININE 1.57*   Estimated Creatinine Clearance: 22.4 mL/min (by C-G formula based on Cr of 1.57). No results for input(s): VANCOTROUGH, VANCOPEAK, VANCORANDOM, GENTTROUGH, GENTPEAK, GENTRANDOM, TOBRATROUGH, TOBRAPEAK, TOBRARND, AMIKACINPEAK, AMIKACINTROU, AMIKACIN in the last 72 hours.   Microbiology:   Medical History: Past Medical History  Diagnosis Date  . Stroke   . Acute bronchitis   . Hypertension     Assessment: 81 y/o F brought to ED after being found unresponsive at the [facility] (admitted to [facility] on 11/04/14 from [facility])  with sats at 80%, RR 40. Pt was being treated for pneumonia with oral antibiotics. He had a left hemiarthroplasty on 11/02/14 (ASA for VTE prophx).   - Labs: Lactic acid elevated 4.01. Troponin 2.84, BNP>4500  WBC up 19, Hgb down 7.8 from last admission, Plts 148>598?, Scr 1.02>1.57 up  - PMH: Dementia, BPH, anemia, HTN, constipation, depression  Goal of Therapy:  eradication of infection  Heparin level 0.3-0.7 Monitor platelets on heparin  Plan:  Fortaz 2g IV x 1 in ED then 1g IV q24h Vancomycin  IV x 1 already given in ED 0955 IV heparin bolus 3000 units then infusion at 700  units/hr Check heparin level in 8 hrs Daily heparin level and CBC  --Add Vancomycin  IV q24h. Trough after 3-5 doses at steady state.   Takiesha Mcdevitt S. Merilynn Finland, PharmD, BCPS Clinical Staff Pharmacist Pager [phone]  Misty Stanley Stillinger 11/11/2014,12:35 PM

## 2014-11-12 NOTE — ED Notes (Signed)
Per EMS- pt was found unresponsive at the nursing facility with sats at 80% resp 40. Pt was being treated for pneumonia with oral antibiotics. Audible rhonchi with EMS. Pt became alert while with EMS and pt was alert to self and situation.

## 2014-11-12 NOTE — ED Notes (Signed)
Consulting NP at bedside. 

## 2014-11-12 NOTE — ED Notes (Signed)
Pt placed on bearhugger for temp. Admitting NP aware. Repeat lactic being drawn.

## 2014-11-13 DIAGNOSIS — Z8673 Personal history of transient ischemic attack (TIA), and cerebral infarction without residual deficits: Secondary | ICD-10-CM

## 2014-11-13 DIAGNOSIS — A419 Sepsis, unspecified organism: Principal | ICD-10-CM

## 2014-11-13 DIAGNOSIS — J189 Pneumonia, unspecified organism: Secondary | ICD-10-CM

## 2014-11-13 DIAGNOSIS — J9601 Acute respiratory failure with hypoxia: Secondary | ICD-10-CM

## 2014-11-13 DIAGNOSIS — F0391 Unspecified dementia with behavioral disturbance: Secondary | ICD-10-CM

## 2014-11-13 DIAGNOSIS — I4891 Unspecified atrial fibrillation: Secondary | ICD-10-CM

## 2014-11-13 LAB — URINE CULTURE: CULTURE: NO GROWTH

## 2014-11-13 MED ORDER — SODIUM CHLORIDE 0.9 % IV SOLN
1.0000 mg/h | INTRAVENOUS | Status: DC
  Administered 2014-11-13: 1 mg/h via INTRAVENOUS
  Filled 2014-11-13: qty 10

## 2014-11-13 MED ORDER — MORPHINE SULFATE 2 MG/ML IJ SOLN
2.0000 mg | INTRAMUSCULAR | Status: DC | PRN
  Administered 2014-11-13: 2 mg via INTRAVENOUS
  Filled 2014-11-13: qty 1

## 2014-11-13 MED ORDER — SCOPOLAMINE 1 MG/3DAYS TD PT72
1.0000 | MEDICATED_PATCH | TRANSDERMAL | Status: DC
  Administered 2014-11-13: 1.5 mg via TRANSDERMAL
  Filled 2014-11-13: qty 1

## 2014-11-13 MED ORDER — ATROPINE SULFATE 1 % OP SOLN
2.0000 [drp] | Freq: Four times a day (QID) | OPHTHALMIC | Status: DC | PRN
  Administered 2014-11-13 (×2): 2 [drp] via SUBLINGUAL
  Filled 2014-11-13: qty 5

## 2014-11-13 MED ORDER — WHITE PETROLATUM GEL
Status: AC
  Administered 2014-11-13: 11:00:00
  Filled 2014-11-13: qty 1

## 2014-11-13 NOTE — Progress Notes (Signed)
2135 Routine rounds pt. Found to be breathless, pulseless with eyes fixated . 2nd RN Marylin Crosby called to room and together pt. pronounced deceased. On call paged @ this time.. Family will be notified after instructions received

## 2014-11-13 NOTE — Progress Notes (Signed)
[employer] # [id number]. Body can be released pt. does not meet donor qualifications.

## 2014-11-13 NOTE — Progress Notes (Signed)
Morphine drip started, scopalamine and atropine gtts.  Will reassess pt.

## 2014-11-13 NOTE — Progress Notes (Signed)
Pt with air hunger and breathing rapidly, called Dr. Sharl Ma and obtained order for increase in morphine.  Added humidification to O2.  Mouth care done.

## 2014-11-13 NOTE — Progress Notes (Signed)
190cc Morphine IV drip wasted in sink [employer] as witness.

## 2014-11-13 NOTE — Care Management Note (Signed)
Case Management Note  Patient Details  Name: Russell [name] MRN: [id number] Date of Birth: 30-Dec-1916  Subjective/Objective:                    Action/Plan:   Expected Discharge Date:                  Expected Discharge Plan:     In-House Referral:     Discharge planning Services     Post Acute Care Choice:    Choice offered to:     DME Arranged:    DME Agency:     HH Arranged:    HH Agency:     Status of Service:     Medicare Important Message Given:   yes Date Medicare IM Given:   12/01/2014 Medicare IM give by:   Meryl Crutch, RN, BSN Date Additional Medicare IM Given:    Additional Medicare Important Message give by:     If discussed at Long Length of Stay Meetings, dates discussed:    Additional Comments:  Isaias Cowman, RN 11/12/2014, 2:48 PM

## 2014-11-13 NOTE — Progress Notes (Signed)
Family visiting and updated on pt condition.

## 2014-11-13 NOTE — Progress Notes (Signed)
1st. Contact Russell [name]; son, has been called,message left to call hospital.

## 2014-11-13 NOTE — Progress Notes (Signed)
[facility] PROGRESS NOTE  Russell [name] ZOX:[id number] DOB: 18-Nov-1916 DOA: 11/11/2014 PCP: Pcp Not In System  Assessment/Plan: 1. Sepsis- patient presented with healthcare associated pneumonia, severe sepsis. After extensive discussion with family he was made comfort care in the ED. No further aggressive management and goal is comfort care with anticipation of in hospital death 2. Respiratory distress- patient currently in respiratory distress will start patient on morphine infusion 1 mg per hour, titrate to 5 minute gram per hour for comfort. I called and discussed with patient's son Kengo Sturges, who agrees with providing comfort with morphine infusion at this time. 3. Terminal secretions- start atropine 1% ophthalmic 2 drops every 4 hours when necessary, also start scopolamine patch every 72 hours Patient has extremely poor prognosis, anticipate hospital death in next 24-48 hours.  Code Status: DO NOT RESUSCITATE Family Communication: *Discussed with patient's son on phone, goal is comfort. Disposition Plan: Anticipate hospital death   Consultants:  None  Procedures:  None  Antibiotics: None  HPI/Subjective: 81 year old male with a history of chronic recurrent aspiration, recurrent pneumonitis, dementia him a CVA, hypertension who was sent from the skilled facility for acute respiratory distress. Initially patient was started on empiric antibiotics for healthcare associated pneumonia, WBC was 19,000, subsequent lactic acid was 4.01, troponin 2.84. Considering the severity of illness the ED physician discussed with patient's son and no aggressive workup was planned and also discussed patient's poor prognosis and made him comfort care. Patient was started on morphine 1 mg every 2 hours when necessary, antibiotics were discontinued.  This morning patient in respiratory distress with respiratory rate more than 30/m Obtunded, unable to provide any  history  Objective: Filed Vitals:   2014-12-12 0445  BP: 86/55  Pulse: 73  Temp:   Resp: 36    Intake/Output Summary (Last 24 hours) at 12-12-14 1529 Last data filed at 2014-12-12 1517  Gross per 24 hour  Intake 260.81 ml  Output     50 ml  Net 210.81 ml   Filed Weights   12/03/2014 0923  Weight: 56.246 kg (124 lb)    Exam:   General:  Appears in distress related to stress, tachypneic  Cardiovascular: S1-S2 regular  Respiratory: Bilateral rhonchi  Abdomen: Soft, nontender, no organomegaly  Musculoskeletal: No edema. Extremities noted   Data Reviewed: Basic Metabolic Panel:  Recent Labs Lab 11/20/2014 0859  NA 140  K 3.6  CL 104  CO2 18*  GLUCOSE 254*  BUN 34*  CREATININE 1.57*  CALCIUM 8.6*   Liver Function Tests: No results for input(s): AST, ALT, ALKPHOS, BILITOT, PROT, ALBUMIN in the last 168 hours. No results for input(s): LIPASE, AMYLASE in the last 168 hours. No results for input(s): AMMONIA in the last 168 hours. CBC:  Recent Labs Lab 11/30/2014 0859  WBC 19.0*  NEUTROABS 17.5*  HGB 7.8*  HCT 24.2*  MCV 96.0  PLT 598*   Cardiac Enzymes: No results for input(s): CKTOTAL, CKMB, CKMBINDEX, TROPONINI in the last 168 hours. BNP (last 3 results)  Recent Labs  11/26/2014 0859  BNP >4500.0*    ProBNP (last 3 results) No results for input(s): PROBNP in the last 8760 hours.  CBG: No results for input(s): GLUCAP in the last 168 hours.  Recent Results (from the past 240 hour(s))  Blood culture (routine x 2)     Status: None (Preliminary result)   Collection Time: 12/04/2014  8:52 AM  Result Value Ref Range Status   Specimen Description BLOOD RIGHT ARM  Final   Special Requests BOTTLES DRAWN AEROBIC AND ANAEROBIC 5CC  Final   Culture NO GROWTH 1 DAY  Final   Report Status PENDING  Incomplete  Blood culture (routine x 2)     Status: None (Preliminary result)   Collection Time: 11/14/2014  8:58 AM  Result Value Ref Range Status   Specimen  Description BLOOD LEFT ARM  Final   Special Requests BOTTLES DRAWN AEROBIC AND ANAEROBIC 5CC  Final   Culture NO GROWTH 1 DAY  Final   Report Status PENDING  Incomplete  Urine culture     Status: None   Collection Time: 11/16/2014 10:07 AM  Result Value Ref Range Status   Specimen Description URINE, RANDOM  Final   Special Requests NONE  Final   Culture NO GROWTH 1 DAY  Final   Report Status December 02, 2014 FINAL  Final     Studies: Dg Chest Port 1 View  11/30/2014   CLINICAL DATA:  81 year old male with respiratory failure, hypoxia. Initial encounter.  EXAM: PORTABLE CHEST - 1 VIEW  COMPARISON:  0855 hr today, and earlier.  FINDINGS: Portable AP semi upright view at 1153 hrs. Stable lung volumes. Mixed patchy and confluent right lung opacity is stable since earlier today. Stable cardiac size and mediastinal contours. No superimposed pneumothorax. Stable left lung with suggestion of some lower lobe collapse.  IMPRESSION: Stable chest since 0855 hrs. Confluent opacity at the right lung base and suspected left lower lobe collapse.   Electronically Signed   By: Odessa [name] M.D.   On: 11/15/2014 12:01   Dg Chest Portable 1 View  11/22/2014   CLINICAL DATA:  81 year old male with shortness of breath and wheezing. Possible pneumonia. Initial encounter.  EXAM: PORTABLE CHEST - 1 VIEW  COMPARISON:  11/01/2014 and earlier.  FINDINGS: Portable AP upright view at 0855 hrs. New extensive confluent opacity throughout the right lower lung. Some associated right lung volume loss. Tortuous thoracic aorta with stable mediastinal contour. Mildly increased medial left lung base/retrocardiac opacity. No pneumothorax. Pulmonary vascularity appears stable.  IMPRESSION: New confluent right lower lung opacity which may reflect a combination of aspiration/pneumonia and pleural effusion.  Left lower lobe atelectasis.   Electronically Signed   By: Odessa [name] M.D.   On: 11/29/2014 09:05    Scheduled Meds: . scopolamine  1 patch  Transdermal Q72H   Continuous Infusions: . sodium chloride 10 mL/hr at 12/02/2014 0434  . morphine 2 mg/hr (12-02-2014 1306)    Principal Problem:   Sepsis due to pneumonia Active Problems:   Anemia   H/O: stroke   HCAP (healthcare-associated pneumonia)   Dementia   Acute respiratory failure with hypoxia   Elevated troponin   Atrial fibrillation with RVR   Acute renal failure   ACS (acute coronary syndrome)   Acute renal failure syndrome    Time spent: 25 min    [facility] S  [facility] Pager [phone]*. If 7PM-7AM, please contact night-coverage at [email or web address], password [facility] 2014/12/02, 3:29 PM  LOS: 1 day

## 2014-11-13 NOTE — Progress Notes (Signed)
Nutrition Brief Note  Patient identified on the Malnutrition Screening Tool (MST) Report  Wt Readings from Last 15 Encounters:  11/24/2014 124 lb (56.246 kg)  11/08/14 124 lb (56.246 kg)  11/05/14 124 lb (56.246 kg)  10/31/14 125 lb (56.7 kg)  09/12/14 124 lb 9 oz (56.5 kg)  03/19/14 138 lb (62.596 kg)  09/12/12 154 lb 3.2 oz (69.945 kg)  02/10/12 160 lb 11.5 oz (72.9 kg)    Body mass index is 16.36 kg/(m^2). Patient meets criteria for Underweight based on current BMI.   Pt has been transitioned to full comfort measures with IV morphine.   Currently patient is NPO.  No nutrition interventions warranted at this time. If nutrition issues arise, please consult RD.   Christophe Louis RD, LDN Nutrition Pager: [phone] 11/29/2014 9:02 AM

## 2014-11-17 LAB — CULTURE, BLOOD (ROUTINE X 2)
Culture: NO GROWTH
Culture: NO GROWTH

## 2014-12-01 NOTE — Discharge Summary (Addendum)
Death Summary  Russell [name] ZOX:[id number] DOB: 12/25/1916 DOA: 11-17-14  PCP: Pcp Not In System   Admit date: 11-17-14 Date of Death: November 18, 2014  Final Diagnoses:  Principal Problem:   Sepsis due to pneumonia Active Problems:   Anemia   H/O: stroke   HCAP (healthcare-associated pneumonia)   Dementia   Acute respiratory failure with hypoxia   Elevated troponin   Atrial fibrillation with RVR   Acute renal failure   ACS (acute coronary syndrome)   Acute renal failure syndrome      History of present illness:  81 year old male with a history of chronic recurrent aspiration, recurrent pneumonitis, dementia him a CVA, hypertension who was sent from the skilled facility for acute respiratory distress. Initially patient was started on empiric antibiotics for healthcare associated pneumonia, WBC was 19,000, subsequent lactic acid was 4.01, troponin 2.84. Considering the severity of illness the ED physician discussed with patient's son and no aggressive workup was planned and also discussed patient's poor prognosis and made him comfort care. Patient was started on morphine 1 mg every 2 hours when necessary, antibiotics were discontinued. patient was  respiratory distress with respiratory rate more than 30/m   Hospital Course:  1. Sepsis- patient presented with healthcare associated pneumonia, severe sepsis. After extensive discussion with family he was made comfort care in the ED. No further aggressive management and goal is comfort care with anticipation of in hospital death 2. Respiratory distress- patien was  in respiratory distress and started patient on morphine infusion 1 mg per hour, titrate to 5 minute gram per hour for comfort. I called and discussed with patient's son Russell [name], who agreed with providing comfort with morphine infusion Patient expired on 2014/11/18 at 2135   Time: 25 minutes  Signed:  Kyal Arts S  [facility] 12/01/2014, 8:10 PM

## 2014-12-09 DEATH — deceased

## 2015-03-12 IMAGING — CR DG CHEST 2V
2 series · 2 of 2 positions shown · non-contrast
Comparison: 08/25/2012 and earlier.

CLINICAL DATA: [AGE] male with weakness, confusion.

CHEST - 2 VIEW

[w chest lat]
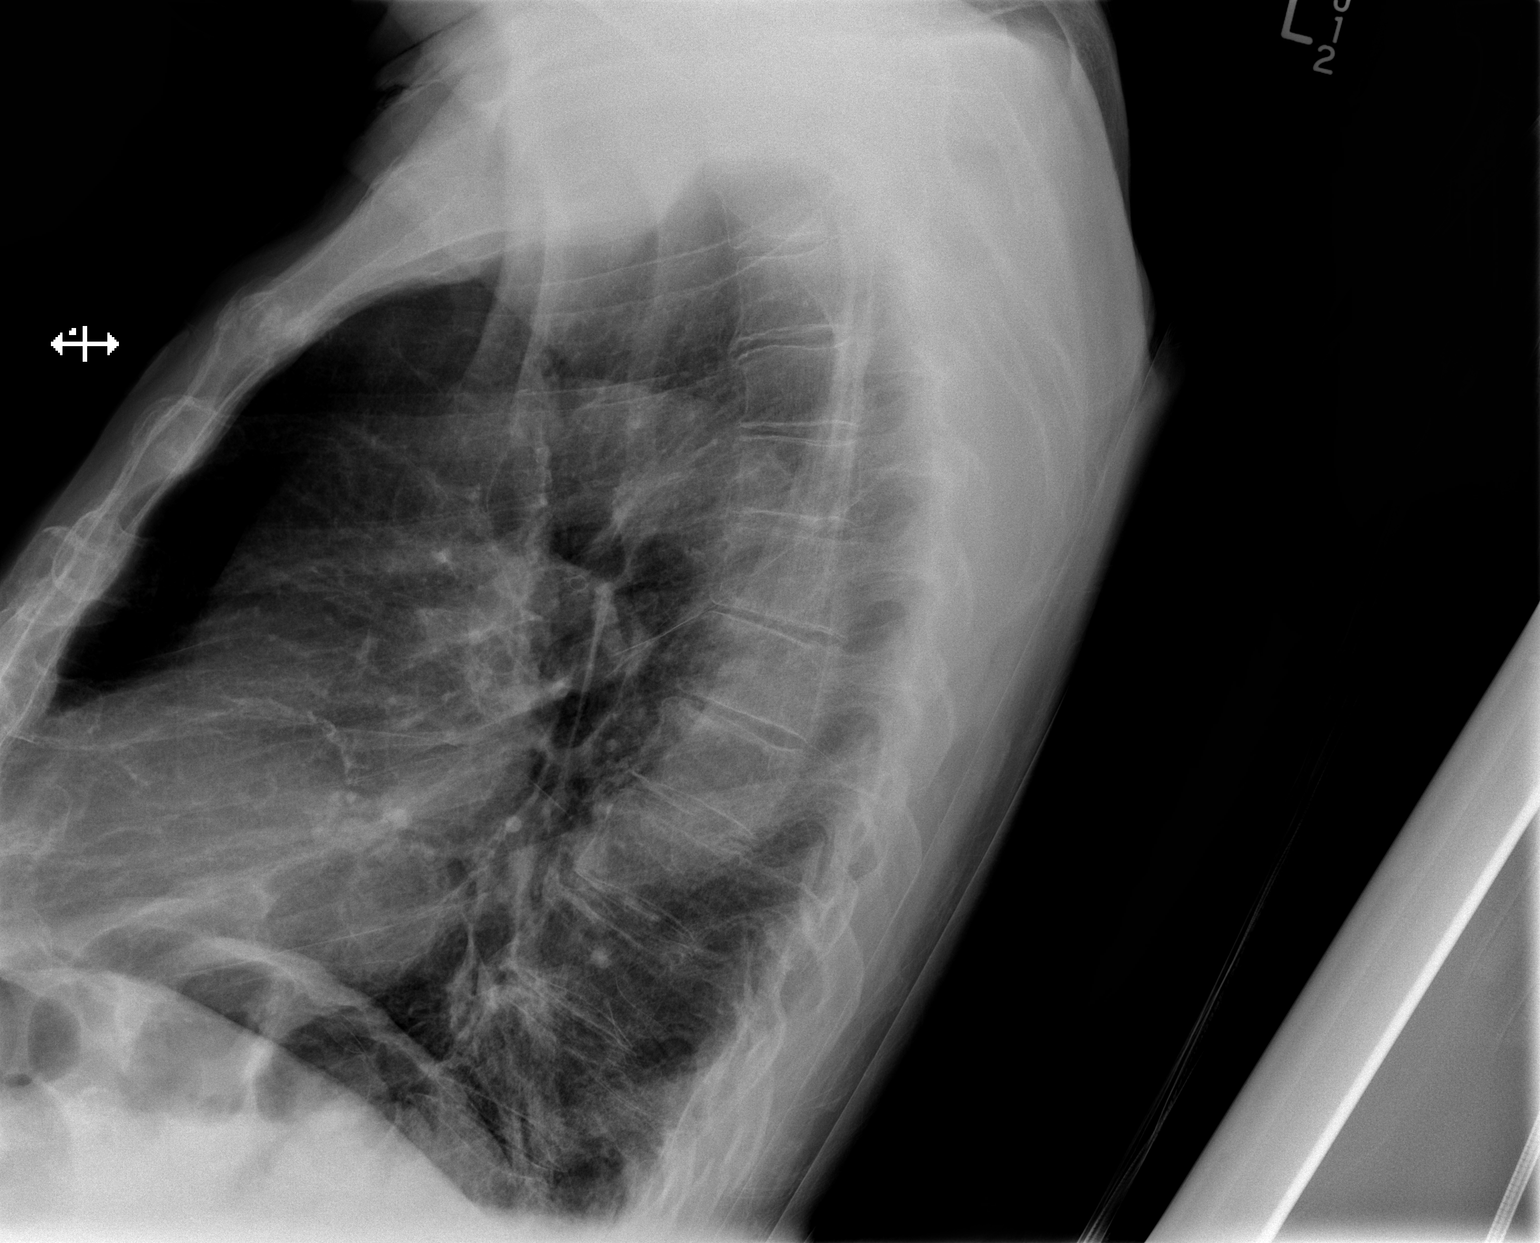

[x chest ap]
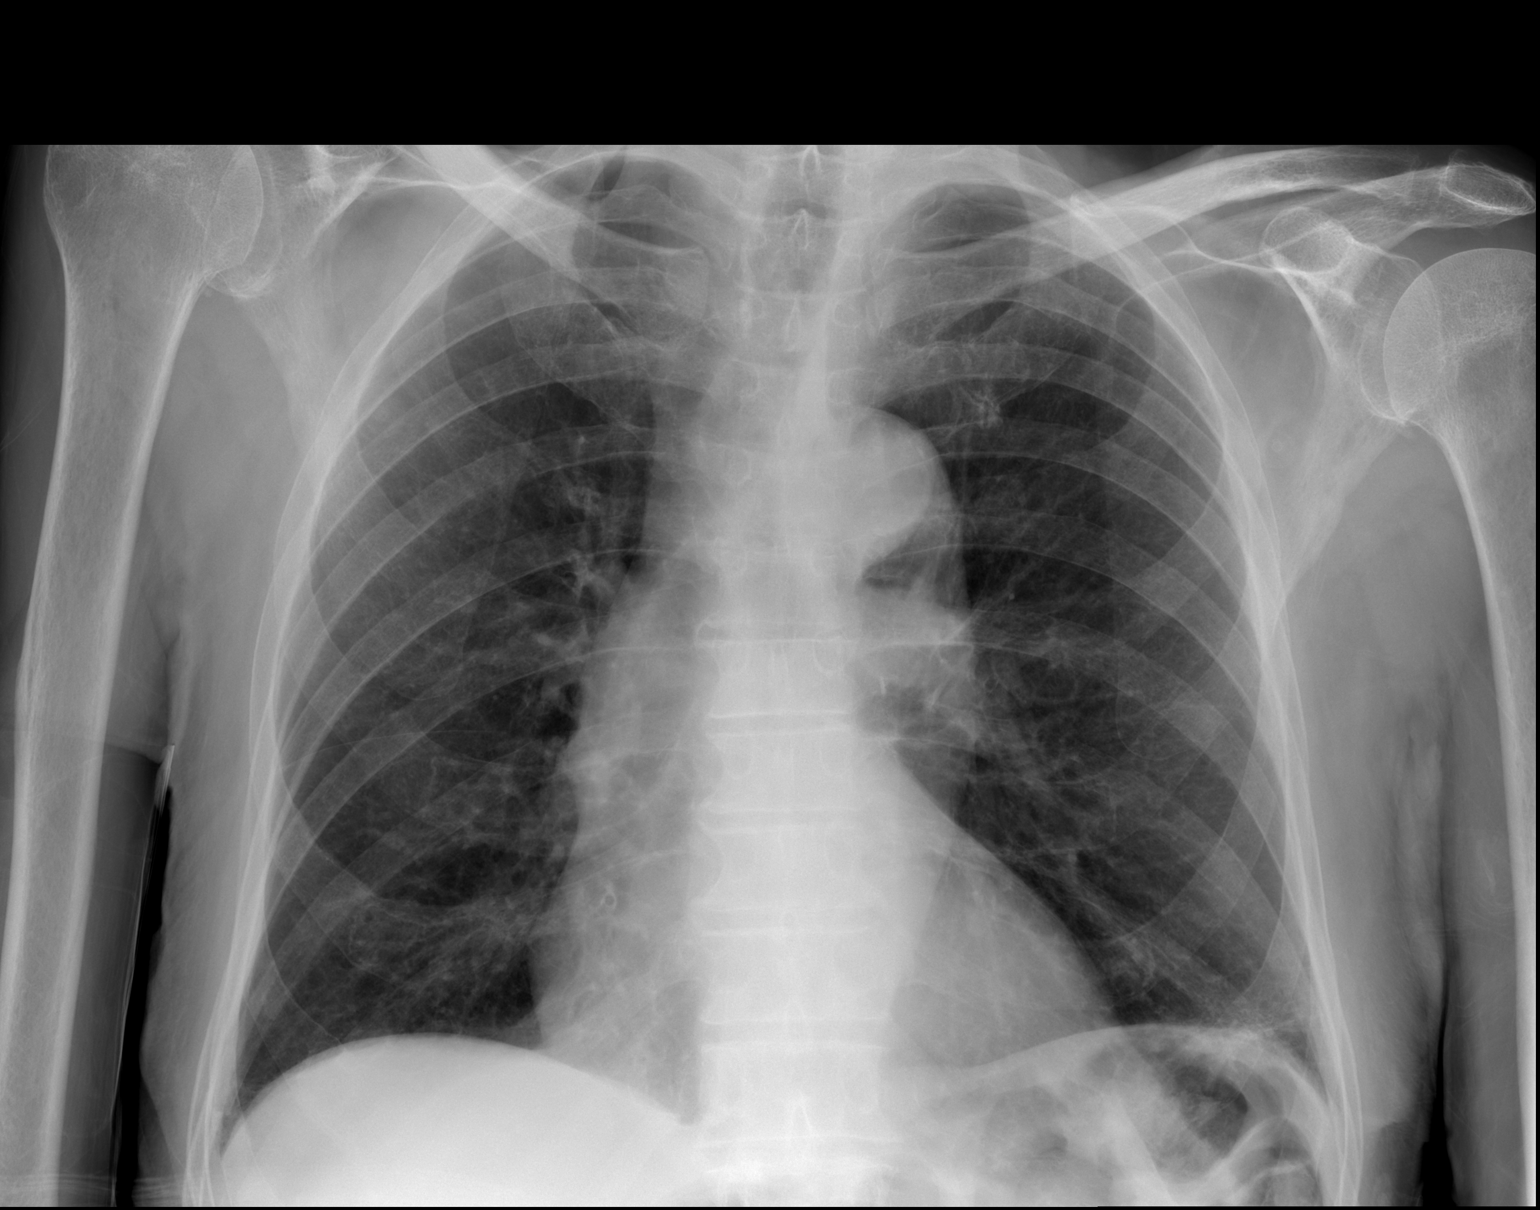

[2 of 2 positions shown; findings below may reference images not displayed]

FINDINGS: AP and lateral views of the chest.  Lung volumes are
stable and within normal limits.  Stable cardiac size at the upper
limits of normal.  Stable tortuosity the thoracic aorta. Other
mediastinal contours are within normal limits.  Visualized tracheal
air column is within normal limits.  No pneumothorax, pulmonary
edema, pleural effusion or confluent pulmonary opacity.
Osteopenia. No acute osseous abnormality identified.
IMPRESSION: No acute cardiopulmonary abnormality.

## 2015-03-12 IMAGING — CT CT HEAD W/O CM
1 series · 16 of 30 positions shown, 20 images · non-contrast
Comparison: 03/18/2012.

CLINICAL DATA: [AGE] male with weakness.  Femoral neck
fracture of unknown etiology.  Decreased communication.

CT HEAD WITHOUT CONTRAST
TECHNIQUE: Contiguous axial images were obtained from the base of
the skull through the vertex without contrast.

[Series 2: head trauma 4.8 h37s · axial · 0.44mm/px · z∈[-125,+32]mm · 16 of 36 slices shown, 20 images]
[im 2/36  brain]
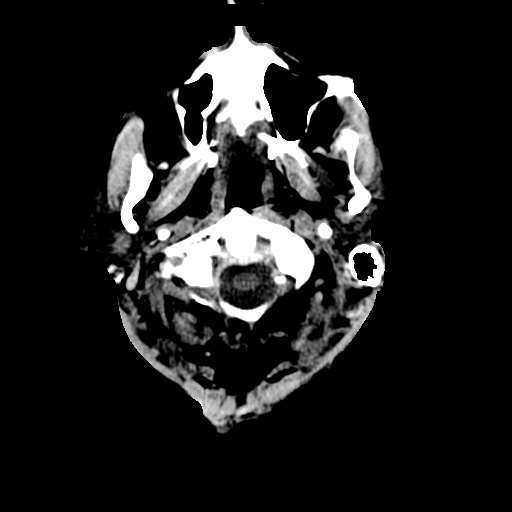
[im 2/36  bone]
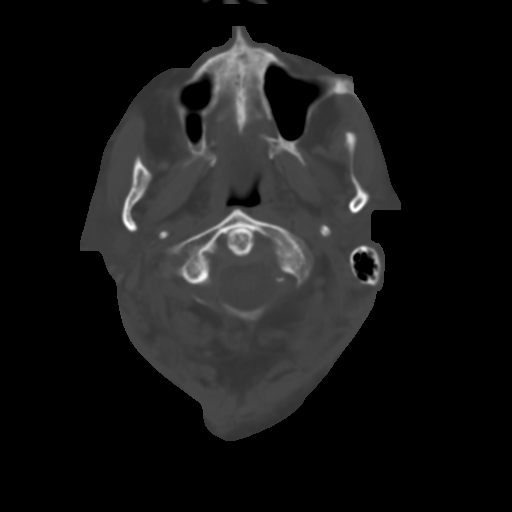
[im 4/36  brain]
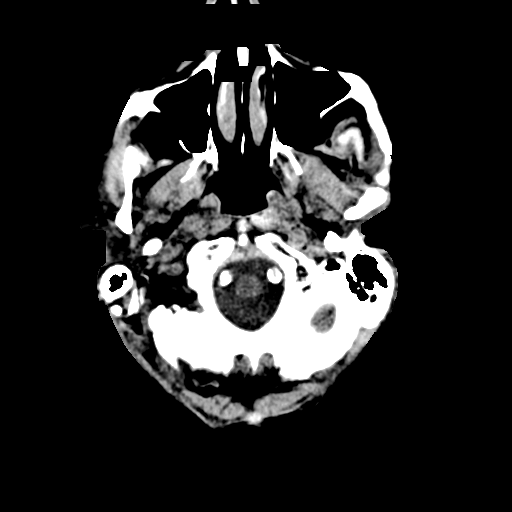
[im 7/36  brain]
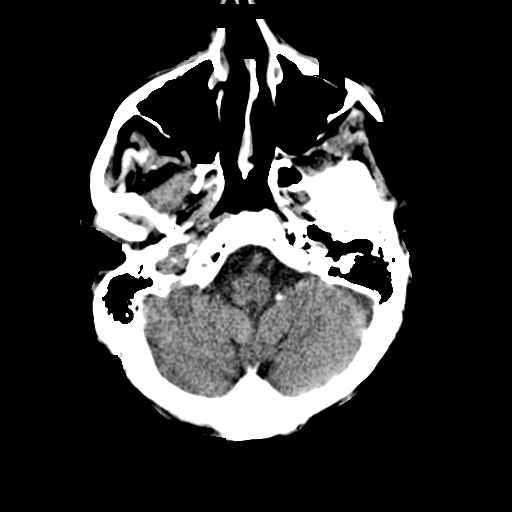
[im 9/36  brain]
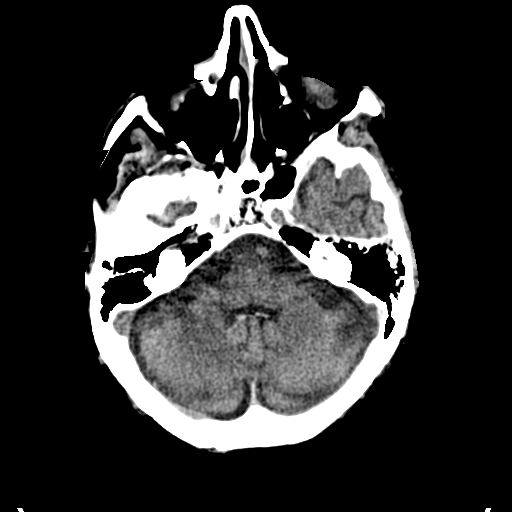
[im 10/36  brain]
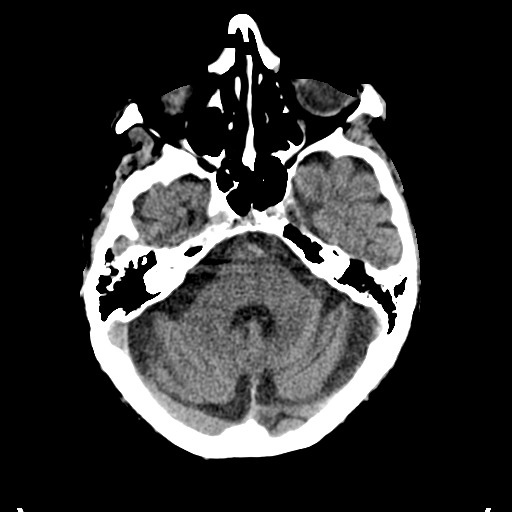
[im 10/36  bone]
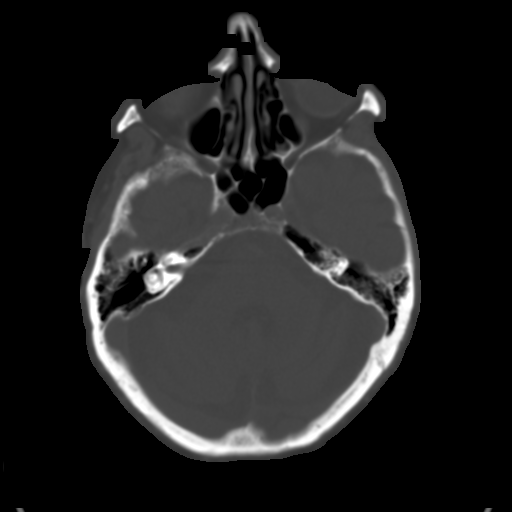
[im 13/36  brain]
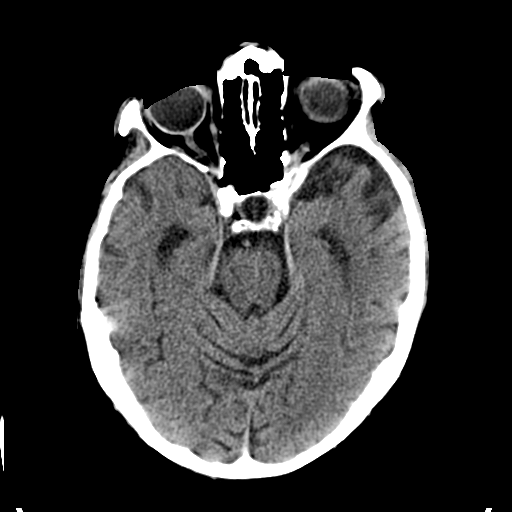
[im 15/36  brain]
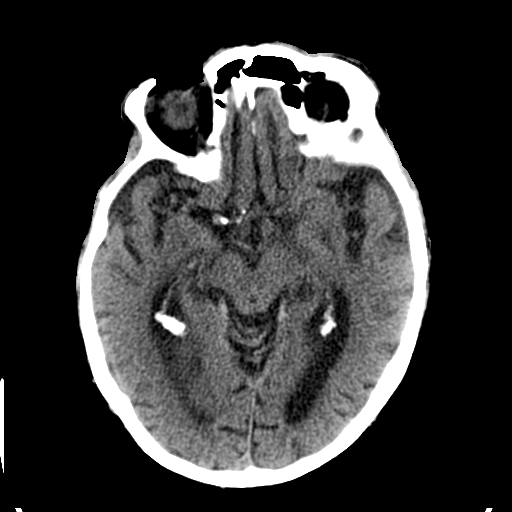
[im 17/36  brain]
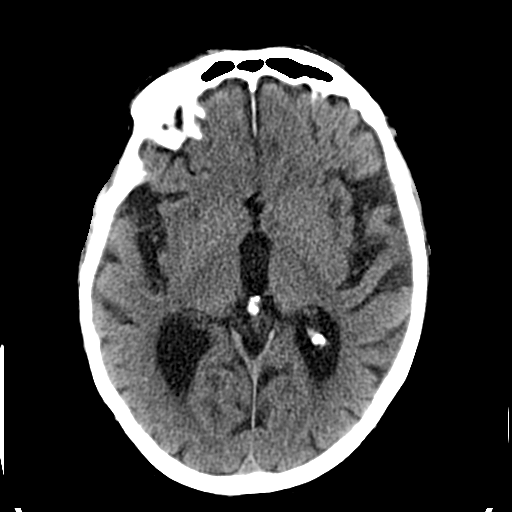
[im 19/36  brain]
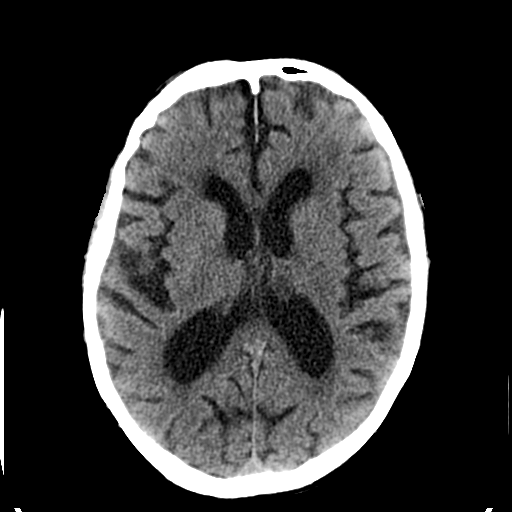
[im 19/36  bone]
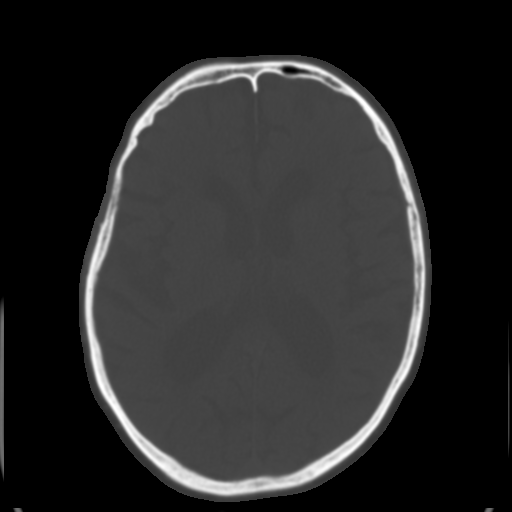
[im 21/36  brain]
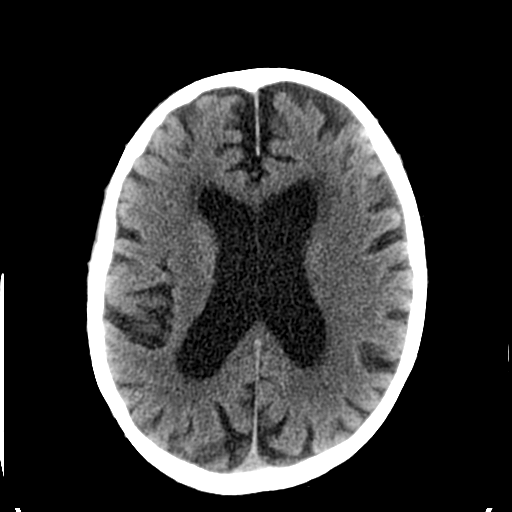
[im 23/36  brain]
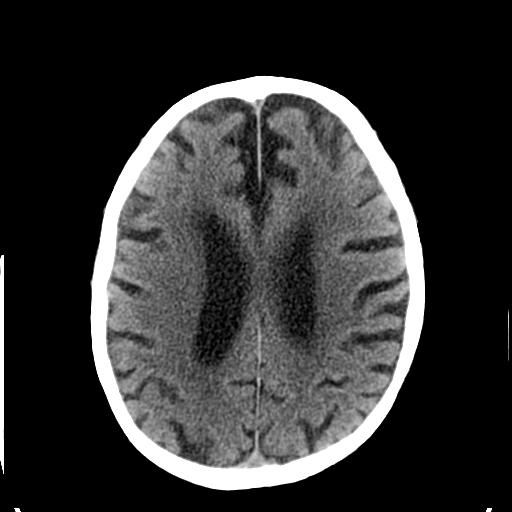
[im 26/36  brain]
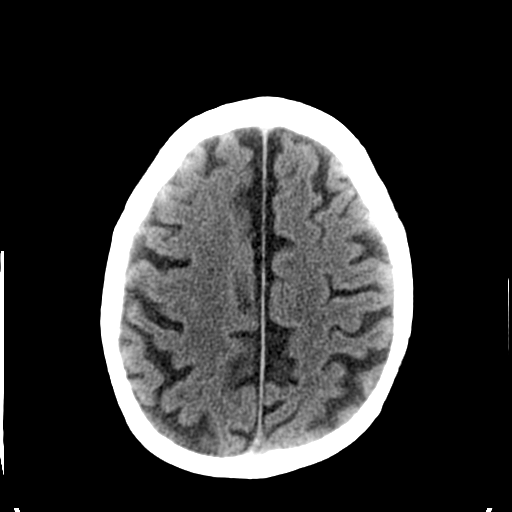
[im 27/36  brain]
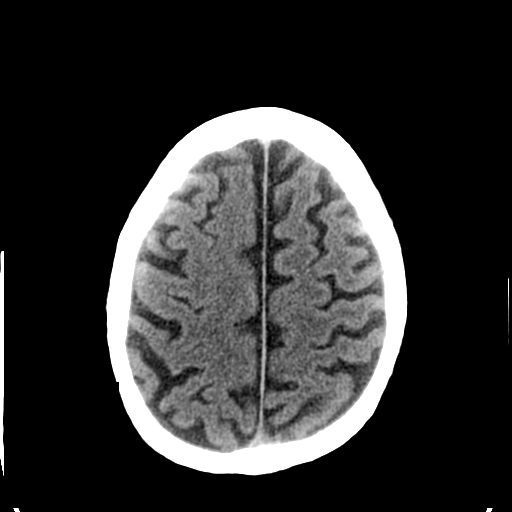
[im 27/36  bone]
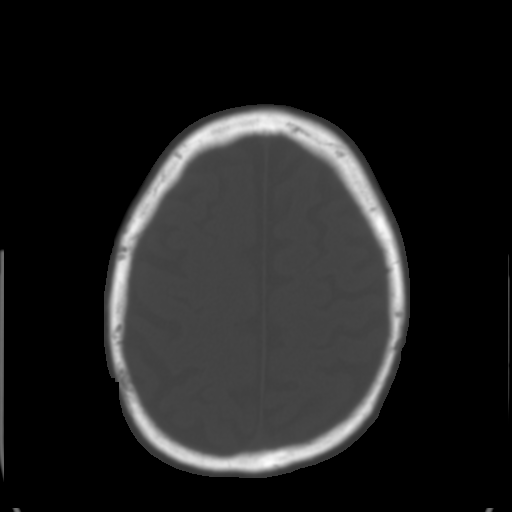
[im 29/36  brain]
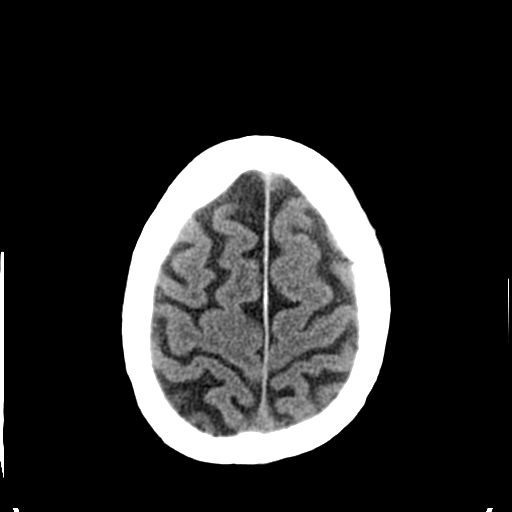
[im 32/36  brain]
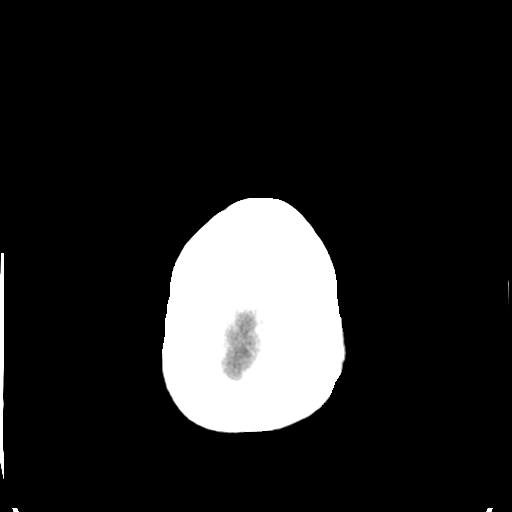
[im 34/36  brain]
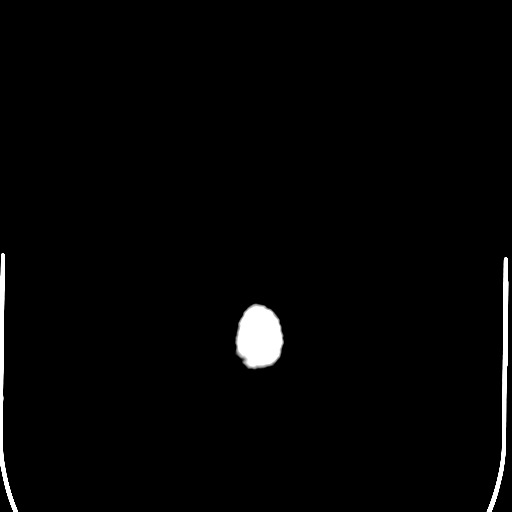

[16 of 30 positions shown; findings below may reference images not displayed]

FINDINGS: Visualized paranasal sinuses and mastoids are clear.  No
scalp hematoma.  No acute orbit soft tissue findings. No acute
osseous abnormality identified.

Extensive calcified atherosclerosis at the skull base.  Stable
cerebral volume.  No ventriculomegaly. No midline shift, mass
effect, or evidence of mass lesion.  No suspicious intracranial
vascular hyperdensity.  Mild for age white matter hypodensity. No
evidence of cortically based acute infarction identified.  No acute
intracranial hemorrhage identified.
IMPRESSION: No acute intracranial abnormality.  Except for a advanced skull
base atherosclerosis, largely unremarkable for age noncontrast CT
appearance of the brain.
